# Patient Record
Sex: Female | Born: 1955 | Race: White | Hispanic: No
Health system: Midwestern US, Academic
[De-identification: ages and names within clinical notes are randomized; demographics above are authoritative.]

## PROBLEM LIST (undated history)

## (undated) LAB — DXA BONE DENSITY AXIAL SKELETON
BMD FEMORAL NECK L: 0.7
BMD PA SPINE: 0.826
BMD TOTAL HIP L: 0.834
BMD TROCHANTER L: 0.639
T-SCORE FEMORAL NECK L: -1.3
T-SCORE PA SPINE: -2.5
T-SCORE TOTAL HIP L: -0.9
Z-SCORE FEMORAL NECK L: -0.2
Z-SCORE PA SPINE: -1.3
Z-SCORE TOTAL HIP L: -0.2

## (undated) LAB — HM DEXA SCAN

## (undated) LAB — HM MAMMOGRAPHY: HM Mammogram: NORMAL

## (undated) LAB — HM PAP SMEAR

## (undated) LAB — HM COLONOSCOPY
HM Colonoscopy: NORMAL
HM Colonoscopy: NORMAL
HM Colonoscopy: NORMAL

---

## 2005-09-20 LAB — CBC AND DIFFERENTIAL
Basophils Absolute: 15 K/uL (ref 0–200)
Basophils Relative: 0.2 %
Eosinophils Absolute: 150 10*3/mm3 (ref 15–500)
Eosinophils Relative: 2 %
Hematocrit: 39.7 % (ref 35.0–45.0)
Hemoglobin: 13.6 g/dL (ref 11.7–15.5)
Lymphocytes Absolute: 2235 10*3/mm3 (ref 850–3900)
Lymphocytes Relative: 29.8 %
MCH: 31.2 pg (ref 27.0–33.0)
MCHC: 34.3 g/dL (ref 32.0–36.0)
MCV: 91.1 fL (ref 80.0–100.0)
MPV: 7.9 fL (ref 7.5–11.5)
Monocytes Absolute: 413 10*3/microliter (ref 200–950)
Monocytes Relative: 5.5 %
Neutrophils Absolute: 4688 K/uL (ref 1500–7800)
Neutrophils Relative: 62.5 %
Platelets: 311 10*3/mm3 (ref 140–400)
RBC: 4.36 10*6/mm3 (ref 3.80–5.10)
RDW: 12.2 % (ref 11.0–15.0)
WBC: 7.5 10*3/mm3 (ref 3.8–10.8)

## 2005-09-20 LAB — COMPREHENSIVE METABOLIC PANEL
A/G Ratio: 1.5 (ref 1.0–2.1)
ALT: 19 U/L (ref 6–40)
AST: 20 U/L (ref 10–35)
Albumin: 4.3 g/dL (ref 3.6–5.1)
Alkaline Phosphatase: 111 U/L (ref 33–115)
BUN/Creatinine Ratio: 26 — ABNORMAL HIGH (ref 6–22)
BUN: 18 mg/dL (ref 7–25)
CO2: 30 mmol/L (ref 21–33)
Calcium: 9.7 mg/dL (ref 8.6–10.2)
Chloride: 104 mmol/L (ref 98–110)
Creatinine: 0.7 mg/dL (ref 0.5–1.2)
GFR MDRD Non Af Amer: >60 mL/min (ref >=60)
Globulin, Total: 2.9 g/dL (ref 2.2–3.9)
Glucose: 82 mg/dL (ref 65–99)
Potassium: 4.3 mmol/L (ref 3.5–5.3)
Sodium: 142 mmol/L (ref 135–146)
Total Bilirubin: 0.5 mg/dL (ref 0.2–1.2)
Total Protein: 7.2 g/dL (ref 6.2–8.3)

## 2005-09-20 LAB — T4, FREE: Free T4: 1.1 ng/dL (ref 0.8–1.8)

## 2005-09-20 LAB — TSH: TSH: 1.19 microintl units/mL

## 2005-09-20 LAB — LITHIUM LEVEL: Lithium Lvl: 0.3 meq/L — ABNORMAL LOW (ref 0.5–1.3)

## 2005-09-20 NOTE — Unmapped (Signed)
Signed by   LinkLogic on 09/21/2005 at 07:30:20  Patient: Sarah Huang  Note: All result statuses are Final unless otherwise noted.    Tests: (1) CBC (INCLUDES DIFF/PLT) (QDL-6399)   WHITE BLOOD CELL COUNT                              7.5 Thousand/uL             3.8-10.8    RED BLOOD CELL COUNT      4.36 Million/uL             3.80-5.10    HEMOGLOBIN                13.6 g/dL                   16.6-06.3    HEMATOCRIT                39.7 %                      35.0-45.0    MCV                       91.1 fL                     80.0-100.0    MCH                       31.2 pg                     27.0-33.0    MCHC                      34.3 g/dL                   01.6-01.0    RDW                       12.2 %                      11.0-15.0    PLATELET COUNT            311 Thousand/uL             140-400    MPV                       7.9 fL                      7.5-11.5    ABSOLUTE NEUTROPHILS      4688 cells/uL               1500-7800    ABSOLUTE LYMPHOCYTES      2235 cells/uL               [phone]    ABSOLUTE MONOCYTES        413 cells/uL                200-950    ABSOLUTE EOSINOPHILS      150 cells/uL                15-500    ABSOLUTE BASOPHILS        15 cells/uL  0-200    NEUTROPHILS               62.5 %    LYMPHOCYTES               29.8 %    MONOCYTES                 5.5 %    EOSINOPHILS               2.0 %    BASOPHILS                 0.2 %    Note: An exclamation mark (!) indicates a result that was not dispersed into   the flowsheet.  Document Creation Date: 09/21/2005 7:30 AM  _______________________________________________________________________    (1) Order result status: Final  Collection or observation date-time: 09/20/2005 08:35  Requested date-time:   Receipt date-time: 09/20/2005 23:00  Reported date-time: 09/21/2005 07:00  Referring Physician:    Ordering Physician: Stan Head (GROVEG)  Specimen Source: B  Source: [name] Order Number: [id number]  Lab site: [facility]  [place]      [address]      [place] [place]  [phone]      -----------------    The following lab values were dispersed to the flowsheet  with no units conversion:      WHITE BLOOD CELL COUNT, 7.5 THOUSAND/UL, (F)  expected units: 10*3/mm3    RED BLOOD CELL COUNT, 4.36 MILLION/UL, (F)  expected units: 10*6/mm3    PLATELET COUNT, 311 THOUSAND/UL, (F)  expected units: 10*3/mm3    ABSOLUTE NEUTROPHILS, 4688 CELLS/UL, (F)  expected units: K/uL    ABSOLUTE LYMPHOCYTES, 2235 CELLS/UL, (F)  expected units: 10*3/mm3    ABSOLUTE MONOCYTES, 413 CELLS/UL, (F)  expected units: 10*3/microliter    ABSOLUTE EOSINOPHILS, 150 CELLS/UL, (F)  expected units: 10*3/mm3    ABSOLUTE BASOPHILS, 15 CELLS/UL, (F)  expected units: K/uL

## 2005-09-20 NOTE — Unmapped (Signed)
Signed by   LinkLogic on 09/21/2005 at 07:30:22  Patient: Sarah Huang  Note: All result statuses are Final unless otherwise noted.    Tests: (1) TSH, 3RD GENERATION (QDL-899)    TSH, 3RD GENERATION       1.19 mIU/L      REFERENCE RANGE:              > OR = 20 YEARS: 0.40-4.50                   PREGNANCY RANGES       FIRST TRIMESTER    0.20-4.70       SECOND TRIMESTER   0.30-4.10       THIRD TRIMESTER    0.40-2.70           Note: An exclamation mark (!) indicates a result that was not dispersed into   the flowsheet.  Document Creation Date: 09/21/2005 7:30 AM  _______________________________________________________________________    (1) Order result status: Final  Collection or observation date-time: 09/20/2005 08:35  Requested date-time:   Receipt date-time: 09/20/2005 23:00  Reported date-time: 09/21/2005 07:00  Referring Physician:    Ordering Physician: Stan Head [facility])  Specimen Source: S  Source: [name] Order Number: [id number]  Lab site: [facility] [place]      [address]      [place]  [place]  [address]

## 2005-09-20 NOTE — Unmapped (Signed)
Signed by   LinkLogic on 09/21/2005 at 07:30:19  Patient: Sarah Huang  Note: All result statuses are Final unless otherwise noted.    Tests: (1) COMPREHENSIVE METABOLIC PANEL W/EGFR (QDL-10231)    GLUCOSE                   82 mg/dL                    16-10                  FASTING REFERENCE INTERVAL    UREA NITROGEN (BUN)       18 mg/dL                    9-60    CREATININE                0.7 mg/dL                   4.5-4.0    GFR ESTIMATED             >60 mL/min/1.86m2           > OR = 60      IF THE PATIENT IS AFRICAN-AMERICAN, PLEASE MULTIPLY      THIS RESULT BY 1.21. THIS RESULT HAS BEEN CALCULATED      ASSUMING THE PATIENT IS NON-AFRICAN AMERICAN.    BUN/CREATININE RATIO (calc)                         [H]  26                          6-22    SODIUM                    142 mmol/L                  135-146    POTASSIUM                 4.3 mmol/L                  3.5-5.3    CHLORIDE                  104 mmol/L                  98-110    CARBON DIOXIDE            30 mmol/L                   21-33    CALCIUM                   9.7 mg/dL                   9.8-11.9    PROTEIN, TOTAL            7.2 g/dL                    1.4-7.8    ALBUMIN                   4.3 g/dL                    2.9-5.6    GLOBULIN (calc)  2.9 g/dL                    4.0-3.4   ALBUMIN/GLOBULIN RATIO (calc)                              1.5                         1.0-2.1    BILIRUBIN, TOTAL          0.5 mg/dL                   7.4-2.5    ALKALINE PHOSPHATASE      111 U/L                     33-115    AST                       20 U/L                      10-35    ALT                       19 U/L                      6-40    Note: An exclamation mark (!) indicates a result that was not dispersed into   the flowsheet.  Document Creation Date: 09/21/2005 7:30 AM  _______________________________________________________________________    (1) Order result status: Final  Collection or observation date-time: 09/20/2005 08:35  Requested  date-time:   Receipt date-time: 09/20/2005 23:00  Reported date-time: 09/21/2005 07:00  Referring Physician:    Ordering Physician: Stan Head (GROVEG)  Specimen Source: S  Source: [name] Order Number: [id number]  Lab site: [facility] [place]      [address]      [place]    [phone]

## 2005-09-20 NOTE — Unmapped (Signed)
Signed by   LinkLogic on 09/21/2005 at 07:30:21  Patient: Sarah Huang  Note: All result statuses are Final unless otherwise noted.    Tests: (1) T-4, FREE (QDL-866)    T-4, FREE                 1.1 ng/dL                   2.3-5.5    Note: An exclamation mark (!) indicates a result that was not dispersed into   the flowsheet.  Document Creation Date: 09/21/2005 7:30 AM  _______________________________________________________________________    (1) Order result status: Final  Collection or observation date-time: 09/20/2005 08:35  Requested date-time:   Receipt date-time: 09/20/2005 23:00  Reported date-time: 09/21/2005 07:00  Referring Physician:    Ordering Physician: Stan Head [facility])  Specimen Source: S  Source: [name] Order Number: [id number]  Lab site: [facility] [place]      [address]      [place]  [place]  [phone]

## 2005-09-20 NOTE — Unmapped (Signed)
Signed by   LinkLogic on 09/21/2005 at 07:30:23  Patient: Sarah Huang  Note: All result statuses are Final unless otherwise noted.    Tests: (1) LITHIUM (QDL-613)    LITHIUM              [L]  0.3 mEq/L                   0.5-1.3    Note: An exclamation mark (!) indicates a result that was not dispersed into   the flowsheet.  Document Creation Date: 09/21/2005 7:30 AM  _______________________________________________________________________    (1) Order result status: Final  Collection or observation date-time: 09/20/2005 08:35  Requested date-time:   Receipt date-time: 09/20/2005 23:00  Reported date-time: 09/21/2005 07:00  Referring Physician:    Ordering Physician: Stan Head [facility]  Specimen Source: S  Source: [name] Order Number: [id number]  Lab site: [facility] [place]      [address]      [place]  [place]  [address]

## 2006-07-26 NOTE — Unmapped (Signed)
Signed by Rusty Aus MD on 07/26/2006 at 00:00:00  ECHO-Transthoracic      Imported By: Coletta Memos 02/24/2009 18:35:11    _____________________________________________________________________    External Attachment:    Please see [employer] EMR for this document.

## 2007-08-22 ENCOUNTER — Inpatient Hospital Stay: Attending: Diagnostic Radiology

## 2008-05-29 ENCOUNTER — Inpatient Hospital Stay

## 2008-05-30 NOTE — Unmapped (Signed)
[facility]     PATIENT NAME:   Sarah Huang, Sarah Huang               MR #:  [id number]   DATE OF BIRTH:  Jun 11, 1955                        ACCOUNT #:  [id number]   PHYSICIAN:      Sena Hitch, M.D.            ROOM #:  SDS   SERVICE:        Gastroenterology                  NURSING UNIT:  [facility]   PRIMARY:        Launa Grill, M.D.               FCSalena Saner   REFERRING:      Sena Hitch, M.D.            ADMIT DATE:  05/29/2008   DICTATED BY:    Sena Hitch, M.D.            PROCEDURE DATE:  05/29/2008                                                     DISCHARGE DATE:                                    ENDOSCOPY REPORT       PREOPERATIVE DIAGNOSIS:     1.  Screening.   2.  Family history of cancer.     POSTOPERATIVE DIAGNOSIS:     1.  Normal.     ANESTHESIA:  Versed 9, fentanyl 100.     CONSENT:  The patient signed informed consent.  Possible complications, but   not limited to bleeding, perforation, missing a polyp or a cancer were   discussed with the patient.     RECTAL EXAM:  External:  Normal.  Digital:  No masses.     DETAILS OF THE PROCEDURE:  The Olympus video colonoscope was advanced into   the rectum, gradually into the sigmoid, to the splenic flexure, to the   transverse hepatic flexure and cecum.     The preparation was good.  Gradually we withdrew the scope.  There was no   evidence of polyps, no evidence of diverticula retroflex view and the rectum   was normal.     ASSESSMENT AND PLAN:  The patient is a 53 year old white female with maternal   aunt with colon cancer and brother and sister with colon polyps.  I will   recommend to have a follow-up colonoscopy in five years.                                                 _______________________________________   MP/dm                                  _____   D:  05/29/2008 10:20                   Sena Hitch, M.D.   T:  05/30/2008 07:50   Job #:  [id number]     c:   Launa Grill, M.D.                  ENDOSCOPY REPORT                                                                PAGE    1 of   1     MP/dm                                  _____   D:  05/29/2008 10:20                   Sena Hitch, M.D.   T:  05/30/2008 07:50   Job #:  [id number]     c:   Launa Grill, M.D.                                    ENDOSCOPY REPORT                                                                PAGE    1 of   1

## 2008-07-09 ENCOUNTER — Inpatient Hospital Stay

## 2008-07-24 NOTE — Unmapped (Signed)
Signed by Non-EMR  Physician on 07/24/2008 at 00:00:00  Vascular Physiological Waveforms      Imported By: Maryellen Pile 07/29/2008 13:15:01    _____________________________________________________________________    External Attachment:    Please see [facility] EMR for this document.

## 2008-07-24 NOTE — Unmapped (Signed)
Signed by Annalee Genta on 07/25/2008 at 13:25:56    [facility]  VASCULAR LAB      Vascular Screening Report     Ordering Physician: Self referal    It is the patient's responsibility to make an appointment with their primary care physician to discuss the screening results if the findings are outside the normal range or if there are other questions about the Vascular Screening Report.       Internal Carotid Artery Study   Right ICA:      Left ICA:   PSV: 55 cm/sec    EDV: 19 cm/sec  PSV: 57 cm/sec    EDV: 21 cm/sec    Right CCA:           Left CCA:   PSV: 71 cm/sec    EDV: 22 cm/sec  PSV: 78 cm/sec    EDV: 23 cm/sec    Normal or Mild:  Physician follow-up is usually not necessary.        Infrarenal Abdominal Aorta:   Transverse Measurements:   Proximal: 1.7 x 1.7 cm    Mid: 1.4 x 1.4 cm    Distal: 1.3 x 1.3 cm    Normal:  Physician follow-up is usually not necessary.        Ankle/Brachial Index (Lower Extremities/Pad):   Right Brachial: 144 mmHg Left Brachial: 155 mmHg  Right PTA: 186 mmHg  Left PTA: 180 mmHg  Right DPA: 155 mmHg  Left DPA: 157 mmHg  Right ABI: 1.2   Left ABI: 1.16    Normal or Mild:  Physician follow-up is usually not necessary.    Vascular Technologist: Luciana Axe RVT  Reviewed by: Joanne Gavel RVT,  Director

## 2008-09-09 ENCOUNTER — Inpatient Hospital Stay: Attending: Diagnostic Radiology

## 2009-02-18 NOTE — Unmapped (Signed)
Signed by Everitt Amber RN on 02/18/2009 at 14:20:02    PHONE NOTE    PHONE NOTE  Call placed to: Patient  Reason for Call: get patient information  Details for Reason: call to pt and VM left with request for medical records to be faxed to [phone].  Action Taken: phone call completed  Summary of call: request for medical records  Call placed by: Everitt Amber RN,  February 18, 2009 2:20 PM

## 2009-02-23 LAB — CBC AND DIFFERENTIAL
Basophils Absolute: 0 K/uL (ref 0.0–0.2)
Basophils Relative: 0.6 % (ref 0–3)
Eosinophils Absolute: 0.1 10*3/mm3 (ref 0.0–0.4)
Eosinophils Relative: 1.9 % (ref 0–7)
Hematocrit: 42.5 % (ref 34–44)
Hemoglobin: 13.7 g/dL (ref 11.5–15.0)
Lymphocytes Absolute: 2.7 K/uL (ref 0.7–4.5)
Lymphocytes Relative: 39.6 % (ref 14–46)
MCH: 29.7 pg (ref 27–34)
MCHC: 32.2 g/dL (ref 32–36)
MCV: 92 fL (ref 80–98)
Monocytes Absolute: 0.5 (ref 0.1–1.0)
Monocytes Relative: 6.8 % (ref 4–13)
Neutrophils Absolute: 3.5 K/uL (ref 1.8–7.8)
Neutrophils Relative: 51 % (ref 40–74)
Platelets: 278 10*3/mm3 (ref 140–415)
RBC: 4.62 10*6/mm3 (ref 3.8–5.1)
RDW: 12.6 % (ref 11.7–15.0)
WBC: 6.8 10*3/mm3 (ref 4.0–10.5)

## 2009-02-23 LAB — THYROID PANEL
Free Thyroxine Index: 2.3 (ref 1.2–4.9)
T3 Uptake Ratio: 30 % (ref 24–39)
T4, Total: 7.8 ug/dL (ref 4.5–12.0)
TSH: 1.01 u[IU]/mL (ref 0.450–4.500)

## 2009-02-23 LAB — COMPREHENSIVE METABOLIC PANEL
A/G Ratio: 1.8 (ref 1.1–2.5)
ALT: 17 U/L (ref 0–40)
AST: 16 U/L (ref 0–40)
Albumin: 4.6 g/dL (ref 3.5–5.5)
Alkaline Phosphatase: 109 U/L (ref 25–150)
BUN/Creatinine Ratio: 24 (ref 8–27)
BUN: 12 mg/dL (ref 5–26)
CO2: 29 mmol/L (ref 20–32)
Calcium: 9.8 mg/dL (ref 8.4–10.5)
Chloride: 105 mmol/L (ref 97–108)
Creatinine: 0.51 mg/dL — ABNORMAL LOW (ref 0.57–1.00)
GFR MDRD Af Amer: 153.48 mL/min (ref >59)
GFR MDRD Non Af Amer: 126.63 mL/min (ref >59)
Glucose: 86 mg/dL (ref 65–99)
Potassium: 4.3 mmol/L (ref 3.5–5.2)
Sodium: 142 mmol/L (ref 135–145)
Total Bilirubin: 0.3 mg/dL (ref 0.1–1.2)
Total Protein: 7.2 g/dL (ref 6.0–8.5)

## 2009-02-23 LAB — LIPID PANEL
Cholesterol, Total: 234 mg/dL — ABNORMAL HIGH (ref 100–199)
HDL: 56 mg/dL
LDL Cholesterol: 150 mg/dL — ABNORMAL HIGH (ref 0.0–99.0)
Triglycerides: 138 mg/dL (ref 0–149)

## 2009-02-23 LAB — OFFICE VISIT LAB RESULTS
Bilirubin Urine: NEGATIVE
Glucose, UA: NEGATIVE
Ketones, UA: NEGATIVE
Leukocytes, UA: NEGATIVE
Nitrite, UA: NEGATIVE
Protein, UA: NEGATIVE
Specific Gravity, UA: 1.02
Urobilinogen, UA: NORMAL
pH, UA: 6

## 2009-02-23 NOTE — Unmapped (Signed)
Signed by Rusty Aus MD on 02/25/2009 at 15:51:13  Patient: Sarah Huang  Note: All result statuses are Final unless otherwise noted.    Tests: (1) Thyroid Panel With TSH (010272)    Order Note: [address] - [address]  [place], [place] [id number]   [id number] CLIA #:[id number]    Thyroxine (T4)            7.8 ug/dL                   (8.4-16.6)    T3 Uptake                 30 %                        (24-39)    FREE THYROXINE INDEX      2.3                         (1.2-4.9)    TSH                       1.010 uIU/mL                (0.450-4.500)    Note: An exclamation mark (!) indicates a result that was not dispersed into   the flowsheet.  Document Creation Date: 02/24/2009 9:45 AM  _______________________________________________________________________    (1) Order result status: Final  Collection or observation date-time: 02/23/2009 09:34  Requested date-time: 02/23/2009 09:34  Receipt date-time: 02/23/2009 09:34  Reported date-time: 02/24/2009 06:15:00  Referring Physician:    Ordering Physician:  Doree Barthel (leesarma)  Specimen Source:   Source: Leverne Humbles Order Number: [id number])  Lab site:

## 2009-02-23 NOTE — Unmapped (Signed)
Signed by Daine Gravel MD on 02/23/2009 at 00:00:00  Disclosure      Imported By: Marlane Hatcher 02/24/2009 10:29:52    _____________________________________________________________________    External Attachment:    Please see [employer] EMR for this document.

## 2009-02-23 NOTE — Unmapped (Signed)
Signed by Daine Gravel MD on 02/23/2009 at 10:23:17    Printed Handout:  - Calcium Food Sources

## 2009-02-23 NOTE — Unmapped (Signed)
Signed by Rusty Aus MD on 02/23/2009 at 00:00:00  EKG Report      Imported By: Coletta Memos 02/24/2009 18:36:38    _____________________________________________________________________    External Attachment:    Please see [employer] EMR for this document.

## 2009-02-23 NOTE — Unmapped (Signed)
Signed by Daine Gravel MD on 02/23/2009 at 00:00:00  Privacy Notice      Imported By: Marlane Hatcher 02/24/2009 08:41:35    _____________________________________________________________________    External Attachment:    Please see [employer] EMR for this document.

## 2009-02-23 NOTE — Unmapped (Signed)
Signed by Rusty Aus MD on 02/23/2009 at 00:00:00  Cardiology ROS      Imported By: Coletta Memos 02/24/2009 18:30:47    _____________________________________________________________________    External Attachment:    Please see [employer] EMR for this document.

## 2009-02-23 NOTE — Unmapped (Signed)
Signed by Sarah Aus MD on 02/23/2009 at 10:38:54    [facility]  CARDIOLOGY NEW PATIENT VISIT    History of Present Illness       Dear Dr. Riley Huang,  I had the pleasure of seeing your patient, [name], for the following medical problems:  1. Chest pain, which appears to be atypical, but she does have strong family history of CAD in multiple family members  2. Hyperlipidemia, her last cholesterol was 250, but she has not been taking statins         PAST HISTORY  Past Medical History (reviewed - no changes required):  Hyperlipidemia  Family History (reviewed - no changes required): Mother - HTN, DIABETES, TRIPLE BYPASS  Father - EMPHYSEMA, CHF  Sister - 2- COPD, 1- BLOOD DISORDER  Brother - 2- MI, COPD, 1- TRIPLE BYPASS, 1- HEART DEFECT, OPEN HEART SURGERY    Coronary Risk Factor Assessment       Hemoglobin: 13.6 (09/20/2005 8:35:00 AM)  Hematocrit: 39.7 (09/20/2005 8:35:00 AM)  MCV: 91.1 (09/20/2005 8:35:00 AM)  MCH: 31.2 (09/20/2005 8:35:00 AM)  WBC: 7.5 THOUSAND/UL (09/20/2005 8:35:00 AM)  Platelets: 311 THOUSAND/UL (09/20/2005 8:35:00 AM)  Serum Glucose: 82 (09/20/2005 8:35:00 AM)  Albumin: 4.3 (09/20/2005 8:35:00 AM)  AST: 20 (09/20/2005 8:35:00 AM)  ALT: 19 (09/20/2005 8:35:00 AM)  Alkaline Phosphatase: 111 (09/20/2005 8:35:00 AM)  Total Bilirubin: 0.5 (09/20/2005 8:35:00 AM)  Sodium: 142 (09/20/2005 8:35:00 AM)  Potassium: 4.3 (09/20/2005 8:35:00 AM)  Chloride: 104 (09/20/2005 8:35:00 AM)  BUN: 18 (09/20/2005 8:35:00 AM)  Creatinine: 0.7 (09/20/2005 8:35:00 AM)  Serum Calcium: 9.7 (09/20/2005 8:35:00 AM)    Review of Systems  See scanned document dated February 23, 2009.  The Review of Systems has been reviewed and the patient advised to follow up with PCP for non-cardiac problems.      Intake   Intake Comments: Consult for abnormla echo      Medications   ASPIRIN 81 MG TABS (ASPIRIN) one by mouth daily  ZOCOR 80 MG TABS (SIMVASTATIN) one by mouth every evening    Allergies  Allergies  have not been documented for this patient    Vital Signs   Height: 63 in.  Weight: 176.0 lbs.   Weight change of 176 lbs since previous visit.    BMI (in-lb): 31.29  BSA (m2): 1.83  Temperature: 98.2 degrees  F (oral)  Pulse rate: 68    Pulse rhythm: regular    Respirations: 18  Pain Level: 0  Note: Pain level is assessed on a 10 point scale. 0 being pain free, 1 being barely noticeable pain and 10 being the worst pain patient has ever felt.  BP #1: 136 / 83 mm Hg  Cuff Size: Std   O2 Saturation: 97% room air   Intake recorded by: Sarah Pel MA  February 23, 2009 8:14 AM        PHYSICAL EXAMINATION  Constitutional: alert, no acute distress, well hydrated, well developed, well nourished.   Skin: normal color, no rashes, no unusual bruising, warm to touch.   Head: atraumatic, normocephalic.   Eyes: pupils equal, no injection, no icterus.   Ears/Nose/Throat: external ears normal, external nose normal, hearing normal.   Neck: no carotid bruits, no thyromegaly.   Chest: no deformities, no apparent respiratory distress, normal percussion, no chest wall tenderness, clear to auscultation, normal breath sounds.   Neck Veins: not distended.   Carotids: normal upstroke, no bruits.   Palpation: normal.  S1 S2: normal S1 S2.   Extra Sounds: no gallop or rub or click.   Rhythm: regular rate and rhythm.   Ectopy: without ectopy.   Bilateral Lower Extremity Edema: none.   Murmur: none.   Carotid - Right: normal.   Carotid - Left: normal.   Radial - Right: normal.   Radial - Left: normal.   Femoral - Right: normal.   Femoral - Left: normal.   Popliteal - Right: normal.   Popliteal - Left: normal.   Posterior Tibial - Right: normal.   Posterior Tibial - Left: normal.   Dorsalis Pedis - Right: diminished.   Dorsalis Pedis - Left: diminished.   Abdomen: nondistended, nontender, normal BS, no organomegaly, no prominent aortic pulsation.   Spine: normal mobility, no deformities.   Extremities: full joint motion, no deformities.    Neuro: cranial nerves grossly intact, non focal.   Psych: affect and mood appropriate, normal interaction.     EKG Interpretation:   Within Normal Limits  Rhythm: Normal Sinus  Conduction: Normal  Axis: Normal  ST Segment: Normal.    QT: Normal  MI: None.      Assessment and Plan     Problems Assessed Today:   HYPERLIPIDEMIA (ICD-272.4)  FAMILY HX CARDIOVASCULAR DISEASE (ICD-V17.49)  CHEST PAIN PRECORDIAL (ICD-786.51)    Assessment   She does have strong family history of CAD and has been complaining of chest pain. Her cholestol is also elevated      Plan   Stress echo to evaluate for ischemia. Zocor 40 mg by mouth daily and add aspirin 81 mg by mouth daily.     Medications   New Prescriptions/Refills:  ZOCOR 80 MG TABS (SIMVASTATIN) one by mouth every evening  #30 x 5, 02/23/2009, Sarah A Napier MA  ASPIRIN 81 MG TABS (ASPIRIN) one by mouth daily  #30 x 5, 02/23/2009, Sarah A Napier MA      Today's Orders   Echocardiogram, Treadmill Stress [CPT-93350]  CBC + plat + Diff  (6399) [*CPT-85025]  Lipid Profile   (FATS) (7600) [CPT-80061]  TSH   (TSH) (899) [ZOX-09604]  Thyroid Profile (T3,T4,TSH)  (7444) [CPT-80092]                Prescriptions:  ZOCOR 80 MG TABS (SIMVASTATIN) one by mouth every evening  #30 x 5   Entered by: Sarah A Napier MA   Authorized by: Sarah Aus MD   Signed by: Sarah A Napier MA on 02/23/2009   Method used: Print then Give to Patient   RxID: [id number]  ASPIRIN 81 MG TABS (ASPIRIN) one by mouth daily  #30 x 5   Entered by: Sarah A Napier MA   Authorized by: Sarah Aus MD   Signed by: Sarah A Napier MA on 02/23/2009   Method used: Print then Give to Patient   RxID: [id number]      Process Orders  Check Orders Results:      [facility]: ABN not required for this insurance  Tests Sent for requisitioning (February 23, 2009 10:38 AM):      02/23/2009: [facility] -- CBC + plat + Diff  (6399) [*CPT-85025] (signed)      02/23/2009: [facility] -- Lipid Profile   (FATS)  (7600) [CPT-80061] (signed)      02/23/2009: [facility] -- TSH   (TSH) (284) [XLK-44010] (signed)      02/23/2009: [facility] -- Thyroid Profile (T3,T4,TSH)  (7444) [CPT-80092] (signed)    ]

## 2009-02-23 NOTE — Unmapped (Signed)
Signed by Rusty Aus MD on 02/25/2009 at 15:51:13  Patient: Sarah Huang  Note: All result statuses are Final unless otherwise noted.    Tests: (1) Lipid Panel [phone])    Order Note: Performing Site: [facility] [address]   [place] [address].    Cholesterol, Total   [H]  234 mg/dL                   (981-191)    Triglycerides             138 mg/dL                   (4-782)    HDL Cholesterol           56 mg/dL                                                                     Female                                                High Risk           <35 - 45                                                Moderate Risk       45 - 55                                                Average Risk        55 - 63                                                Low Risk             60 - 75                                                Very Low Risk           >75                           Note: This interpretation is independent of other                                 risk factors.  ! VLDL Cholesterol Cal      28 mg/dL                    (  5.0-40.0)    LDL Cholesterol Calc [H]  213 mg/dL                   (0.8-65.7)    Note: An exclamation mark (!) indicates a result that was not dispersed into   the flowsheet.  Document Creation Date: 02/24/2009 9:37 AM  _______________________________________________________________________    (1) Order result status: Final  Collection or observation date-time: 02/23/2009 09:34  Requested date-time: 02/23/2009 09:34  Receipt date-time: 02/23/2009 09:34  Reported date-time: 02/23/2009 20:11:00  Referring Physician:    Ordering Physician:  Doree Barthel (leesarma)  Specimen Source:   Source: Leverne Humbles Order Number: [id number])  Lab site:

## 2009-02-23 NOTE — Unmapped (Signed)
Signed by Daine Gravel MD on 02/23/2009 at 10:18:26    Printed Handout:  - High Blood Pressure: Eating Foods Low in Salt: Brief Version

## 2009-02-23 NOTE — Unmapped (Signed)
Signed by Daine Gravel MD on 02/23/2009 at 93:23:55      Reason for Visit   Chief Complaint: NEW PT    History from: patient    Allergies  Allergies have not been documented for this patient    Medications   New Directive:  DISCLOSURE TO FAMILY AND FRIENDS    Vital Signs:   Wt: 176 lbs.      Pulse: 72  BP: 148/85  Barriers to Care/Communication: Hearing  Urine Dip: Done     Intake recorded by: Philipp Ovens MA on February 23, 2009 9:26 AM      History of Present Illness: new pt  some breathing troubles, seeing cardiol. for this, to have stress test Fri  strong fam hx of cardiac problems  hx of sleep apnea and needs referral for sleep eval  very fatigued and very hard to get up in am  dx'ed w/OSA  in '08 when snoring a lot and had sleep study and dx'ed OSA  tried cpap but some trouble adjusting to it  some wt gain past few yrs  overdue for CPE   FASTING  bad hot flashes and vag dryness         PAST HISTORY  Past Medical History (reviewed - no changes required):  kidney stones  Surgical History (reviewed - no changes required):  s/p hysty/BSO--ectopic pregnancy/prolapse, hx sinus surg x4    Family History (reviewed - no changes required): mother--DM, HTN, CAD, COPD, high chol  aunt--breast ca  father--CAD, COPD, high chol  siblings--CAD, COPD  Social History (reviewed - no changes required): Marital Status: divorced,   Children: 2,   Alcohol Use: occasionally  Tobacco Usage:prior smoker    Review of Systems  Refer to HPI for review of systems documentation.      Physical Examination:   BP: 148/  85    Additional Vitals:   BP: 146 / 90    Physical Exam- Detail:   General Appearance: well-developed, well-nourished and in no acute distress. mildly obese  Skin: No suspicious rashes or lesions.  Eyes: Sclera white, conjunctiva without injection and pallor.  PERRLA.  EOMI  Ears: No lesions.  Tympanic membranes translucent, non-bulging.  Canal walls pink, without discharge.  Hearing grossly  intact.  Oropharynx: Normal appearance.  No erythema, exudate or mass. No tonsillar swelling.  Oral Cavity: Gums pink, good dentition.  Oral mucosa and tongue without lesions.  Respiratory: Respiration un-labored.  Lung fields clear to auscultation.  No wheezing, rales, rhonchi or pleural rub.  Neck: No thyromegaly.  No nodules, masses or tenderness.  Lymphatic: Areas palpated not enlarged:  cervical, supraclavicular.  Breast: Breasts symmetrical.  No lumps, masses, discharge, tenderness or dimpling.  Cardiac: S1 and S2 normal.  RRR without murmurs, rubs, gallops.  No JVD.  Vascular: No carotid bruits.  No edema or varicosities.  Pedal Pulse Left: dorsalis pedis +2, posterior tib +2  Pedal Pulse Right: dorsalis pedis +2,  posterior tib +2  Abdomen: No masses or tenderness. Bowel sounds active x4 quad.  Liver and spleen are without tenderness or enlargement.  No hernias.  Female G/U: s/p hysty  Neurologic: sens/motor/dtr/cereb intact  Psychiatric: normal affect      In office Procedures & Tests     Routine Urinalysis     Physical characteristics   Color: yellow  Appearance: clear    Chemical measurements   Glucose (mg/dL): negative  Bilirubin: negative  Ketone (mg/dL): negative  Spec. Gravity: 1.020  Blood: non-hemolyzed  moderate  pH: 6  Protein (mg/dL): negative  Urobilinogen (mg/dL): normal  Nitrite: negative  Leukocytes: negative         New Problems:  ELEVATED BP READING WITHOUT DX HYPERTENSION (ICD-796.2)  GERD (ICD-530.81)  SLEEP APNEA (ICD-780.57)  SLEEP APNEA (ICD-780.57)  ROUTINE GENERAL MEDICAL EXAM@HEALTH  CARE FACL (ICD-V70.0)  FATIGUE (ICD-780.79)      Preventive Maintenance     Colonoscopy Test Date: 01/11/2008 Colonoscopy: Normal   Mammogram Date: 01/11/2008 Mammogram: Normal             Assessment and Plan     Problems   Status of Existing Problems:  Assessed ROUTINE GENERAL MEDICAL EXAM@HEALTH  CARE FACL as comment only - counselled re HM - Daine Gravel MD  Assessed ELEVATED BP READING  WITHOUT DX HYPERTENSION as comment only - counseleld re lifestyle measures to improve this - Daine Gravel MD  New Problems:  Dx of ELEVATED BP READING WITHOUT DX HYPERTENSION (ICD-796.2)  Onset: 02/23/2009  Dx of GERD (ICD-530.81)  Onset: 02/23/2009  Dx of SLEEP APNEA (ICD-780.57)  Onset: 02/23/2009  Dx of SLEEP APNEA (ICD-780.57)  Onset: 02/23/2009  Dx of ROUTINE GENERAL MEDICAL EXAM@HEALTH  CARE FACL (ICD-V70.0)  Onset: 02/23/2009  Dx of FATIGUE (ICD-780.79)  Onset: 02/23/2009  Today's Orders   66440 - Preventive, New, 40-64 yr [CPT-99386]  UA Dipstick (Office) [CPT-81002]  CMP   (METAPNL) (10231) [CPT-80053]  Sleep Medicine Consult [UCP-11111]    Patient Instructions   check bp weekly and keep a record of readings to bring w/you    Disposition:   in 3 month(s)                   Process Orders  Check Orders Results:      Error: EMR Link Lab: An error occurred trying to run '[email or web address]' : The system cannot find the file specified.  Tests Sent for requisitioning (February 23, 2009 4:27 PM):      02/23/2009: EMR Link Lab -- CMP   (METAPNL) (10231) [CPT-80053] (signed)    ]

## 2009-02-23 NOTE — Unmapped (Signed)
Signed by Rusty Aus MD on 02/25/2009 at 15:51:13  Patient: Sarah Huang  Note: All result statuses are Final unless otherwise noted.    Tests: (1) Comp. Metabolic Panel (14) (322000)    Order Note: Performing Site: [facility] [address]   [place] [address].    Calcium, Serum            9.8 mg/dL                   (8.1-19.1)      Please note: The calcium reference range has been updated as of       02/21/2009.    Glucose, Serum            86 mg/dL                    (47-82)    BUN                       12 mg/dL                    (9-56)   Protein, Total, Serum                              7.2 g/dL                    (2.1-3.0)    Albumin, Serum            4.6 g/dL                    (8.6-5.7)    Bilirubin, Total          0.3 mg/dL                   (8.4-6.9)   Alkaline Phosphatase, S                              109 U/L                     (25-150)    AST (SGOT)                16 U/L                      (0-40)    Potassium, Serum          4.3 mmol/L                  (3.5-5.2)    Sodium, Serum             142 mmol/L                  (135-145)    Chloride, Serum           105 mmol/L                  (97-108)    Creatinine, Serum    [L]  0.51 mg/dl                  (6.29-5.28)    ALT (SGPT)                17 U/L                      (  0-40)   Carbon Dioxide, Total                              29 mmol/L                   (20-32)    BUN/Creatinine Ratio      24                          (8-27)  ! Globulin, Total           2.6 g/dL                    (4.4-0.1)    A/G Ratio                 1.8                         (1.1-2.5)    Glom Filt Rate, Est       126.63 mL/min               (>59-)    If African American       153.48 mL/min               (>59-)    Note: An exclamation mark (!) indicates a result that was not dispersed into   the flowsheet.  Document Creation Date: 02/24/2009 9:37 AM  _______________________________________________________________________    (1) Order result status:  Final  Collection or observation date-time: 02/23/2009 09:34  Requested date-time: 02/23/2009 09:34  Receipt date-time: 02/23/2009 09:34  Reported date-time: 02/23/2009 20:11:00  Referring Physician:    Ordering Physician:  Doree Barthel (leesarma)  Specimen Source:   Source: Leverne Humbles Order Number: [id number])  Lab site:

## 2009-02-23 NOTE — Unmapped (Signed)
Signed by Rusty Aus MD on 02/25/2009 at 15:51:13  Patient: Sarah Huang  Note: All result statuses are Final unless otherwise noted.    Tests: (1) CBCwDiffwPlt (540981)    Order Note: Performing Site: [facility] [address]   [place] [address].    WBC                       6.8 x10E3/uL                (4.0-10.5)    RBC                       4.62 x10E6/uL               (3.8-5.1)    Hemoglobin                13.7 g/dL                   (82.9-56.2)    Hematocrit                42.5 %                      (34-44)    MCV                       92 fL                       (80-98)    MCH                       29.7 pg                     (27-34)    MCHC                      32.2 g/dL                   (13-08)    Neutrophils               51.0 %                      (40-74)    Lymphocytes               39.6 %                      (14-46)    Monocytes                 6.8 %                       (4-13)    Eosinophils               1.9 %                       (0-7)    Basophils                 0.6 %                       (0-3)    Platelets  278 x10E3/uL                (140-415)   Neutrophils (Absolute)                              3.5 x10E3/uL                (1.8-7.8)   Lymphocytes (Absolute)                              2.7 x10E3/uL                (0.7-4.5)    Monocytes(Absolute)       0.5 x10E3/uL                (0.1-1.0)   Eosinophils (Absolute)                              0.1 x10E3/uL                (0.0-0.4)    Basophils (Absolute)      0.0 x10E3/uL                (0.0-0.2)    RDW                       12.6 %                      (11.7-15.0)    Note: An exclamation mark (!) indicates a result that was not dispersed into   the flowsheet.  Document Creation Date: 02/24/2009 9:35 AM  _______________________________________________________________________    (1) Order result status: Final  Collection or observation date-time: 02/23/2009 09:34  Requested date-time: 02/23/2009  09:34  Receipt date-time: 02/23/2009 09:34  Reported date-time: 02/23/2009 19:29:00  Referring Physician:    Ordering Physician:  Doree Barthel (leesarma)  Specimen Source:   Source: Leverne Humbles Order Number: [id number])  Lab site:       -----------------    The following lab values were dispersed to the flowsheet  with no units conversion:      WBC, 6.8 X10E3/UL, (F)  expected units: 10*3/mm3    RBC, 4.62 X10E6/UL, (F)  expected units: 10*6/mm3    Platelets, 278 X10E3/UL, (F)  expected units: 10*3/mm3    Neutrophils (Absolute), 3.5 X10E3/UL, (F)  expected units: K/uL    Lymphocytes (Absolute), 2.7 X10E3/UL, (F)  expected units: K/uL    Eosinophils (Absolute), 0.1 X10E3/UL, (F)  expected units: 10*3/mm3    Basophils (Absolute), 0.0 X10E3/UL, (F)  expected units: K/uL

## 2009-02-24 NOTE — Unmapped (Signed)
Signed by Eli Hose RN on 02/24/2009 at 15:13:01    PHONE NOTE    PHONE NOTE  Call placed to: Patient  Reason for Call: confirm/change appointment  Action Taken: phone call completed  Summary of call: LMA instructing pt to return the call regarding test on Friday. Would like to discuss date, time, and instructions.  Call placed by: Eli Hose RN,  February 24, 2009 3:11 PM

## 2009-02-27 NOTE — Unmapped (Signed)
Signed by Nelva Bush RDCS on 03/02/2009 at 08:46:45    Clinical Lists Changes    Orders:  Added new Service order of Stress Echo ([id number]) - Signed  Added new Service order of Color Flow Doppler ([id number]) - Signed  Added new Service order of Cardiac Doppler ([id number]) - Signed

## 2009-02-27 NOTE — Unmapped (Signed)
Signed by April A Napier MA on 02/27/2009 at 13:33:10    Clinical Lists Changes    Orders:  Added new Test order of Liver function panel  (LIVP) (10256) (CPT-80076) - Signed  Added new Test order of Lipid Profile   (FATS) (7600) ([id number]) - Signed      Process Orders  Check Orders Results:      EMR Link Lab: ABN not required for this insurance  Tests Sent for requisitioning (February 27, 2009 1:33 PM):      02/27/2009: EMR Link Lab -- Liver function panel  (LIVP) (10256) [CPT-80076] (signed)      02/27/2009: EMR Link Lab -- Lipid Profile   (FATS) (7600) [CPT-80061] (signed)

## 2009-02-27 NOTE — Unmapped (Signed)
Signed by Rusty Aus MD on 02/27/2009 at 00:00:00  ECHO-Transthoracic      Imported By: Coletta Memos 03/10/2009 10:34:58    _____________________________________________________________________    External Attachment:    Please see [employer] EMR for this document.

## 2009-02-27 NOTE — Unmapped (Signed)
Signed by Rusty Aus MD on 02/27/2009 at 00:00:00  EKG Report      Imported By: Betsey Amen 03/17/2009 16:58:01    _____________________________________________________________________    External Attachment:    Please see [employer] EMR for this document.

## 2009-02-27 NOTE — Unmapped (Signed)
Signed by Lionel December MD on 02/27/2009 at 00:00:00  Stress Echo      Imported By: Betsey Amen 03/04/2009 21:10:41    _____________________________________________________________________    External Attachment:    Please see [employer] EMR for this document.

## 2009-03-05 NOTE — Unmapped (Signed)
Signed by Rusty Aus MD on 03/05/2009 at 00:00:00  Outside Medical Records      Imported By: Betsey Amen 03/05/2009 15:22:21    _____________________________________________________________________    External Attachment:    Please see [employer] EMR for this document.

## 2009-04-15 NOTE — Unmapped (Signed)
Signed by Maxwell Marion MD on 04/15/2009 at 00:00:00  Sleep Medicine History Questionnaire      Imported By: Coletta Memos 04/25/2009 13:33:07    _____________________________________________________________________    External Attachment:    Please see [employer] EMR for this document.
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
## 2009-04-16 NOTE — Unmapped (Signed)
Signed by Maxwell Marion MD on 04/16/2009 at 10:40:09                        [facility]         Pulmonary, Critical Care, and Sleep Medicine         [address]         [place], [place] [address]         p [phone] f [phone]         [email or web address]      April 16, 2009        RE: MELISA DONOFRIO  DOB:   02/01/1955        Dear Drs. Parthasarathi and Darrel Reach,    I had the pleasure of seeing Maeli Spragg at your request for consultation. My detailed assessment and plan is described below. Should you have any further questions please do not hesitate to contact me at [phone].    IMPRESSION & PLAN:    Ms. Baria is a 54 year old woman with a history of depression who presents with snoring, gasp arousals, witnessed apneas, morning headaches, and excessive daytime sleepiness.  On examination, she has a crowded airway, with macroglossia, micrognathia and retrognathia.  She had been diagnosed with obstructive sleep apnea three years ago, but had not been on positive airway pressure treatment for the past two years due to financial reasons.  She was also intolerant to the pressure.      Since her initial diagnosis, she has gained 30-40 pounds.  As a result, the degree of sleep-disordered breathing may have changed.  In order to fully evaluate, we will order a polysomnogram.  If the polysomnogram results are consistent with obstructive sleep apnea, she will be scheduled for a titration study prior to initiating treatment.   She mentions that she was on bilevel positive airway pressure treatment, though also states that she was on 9cm of water, suggesting that she was on continuous positive airway pressure treatment.  Regardless, will start with continuous positive airway pressure and switch to bilevel positive airway pressure treatment if necessary for patient comfort.  If still intolerant to positive airway pressure therapy, other options such as an oral  appliance may be considered, especially since she does have micrognathia on examination.      Since daytime sleepiness also may be due to an inadequate amount of sleep at night, she is encouraged to increase her sleep time to 6-8 hours per night.  She was strongly encouraged not to drive while drowsy.       Sincerely,        Maxwell Marion MD

## 2009-04-27 ENCOUNTER — Inpatient Hospital Stay

## 2009-04-27 NOTE — Unmapped (Signed)
Signed by Doren Custard on 05/04/2009 at 14:29:55    [facility]  Sleep Medicine Center  [address], [address] [place], [place] [address]  Tel: [phone]\\\\ Fax: [phone]  NOCTURNAL POLYSOMNOGRAPHY REPORT    Patient Name :   Sarah Huang, Sarah Huang Project :   Sarah Huang, Sarah Huang [facility] # :  [id number] Subject Code :  456/11   Study Date :   04/27/2009 Referring Physician :   Riley Churches   Sex :   D.O.B. :    Female  03/31/1955 Sleep Specialist :   Marry Guan     This is a nocturnal polysomnogram performed on the evening of 04/27/2009, on this 54 year-old Female with a height of 64.0 inches and a weight of 185.0 pounds, for a BMI of 31.8 kg/m2 which is within obesity range. Sarah Huang is a 54 year old woman with a history of depression who has snoring, gasp arousals, witnessed apneas, morning headaches, and excessive daytime sleepiness.  She has a known history of obstructive sleep apnea (no results available for review) but was intolerant to CPAP in the past.  Since the patient had gained 30-40 pounds since her previous study, a repeat diagnostic polysomnogram was performed to determine the degree of sleep-disordered breathing.      This study was performed on a [employer]man?? Elite sleep system, monitoring right and left oculograms, bilateral frontal, central, and occipital EEG leads, nasal oral airflow via nasal pressure and thermocouple, thoracic and abdominal respiratory effort channels via piezoelectric method, EKG, pulse rate, arterial oxyhemoglobin saturation level via pulse oximetry, left and right anterior tibialis EMG, chin EMG, body position, video channel, and snoring microphone.    The raw data was visually analyzed and scored according to AASM accepted criteria. Apnea is defined as cessation of airflow for at least 10 seconds; hypopnea is defined as at least a 50% reduction in the nasal pressure transducer signal lasting at least 10 seconds, accompanied by at least a 3% decrease in  oxyhemoglobin saturation or an arousal.  The raw polysomnographic information was reviewed in its entirety by the interpreting physician.     EEG Analysis:  There were no apparent ictal or interictal abnormalities on the six-lead EEG.      Sleep Parameters:  Review of the electroencephalographic tracings revealed that this patient was studied for 390.9 minutes, of which 357.5 minutes were spent during sleep, for sleep efficiency of 91.4%. The sleep onset latency was 8.5 minutes.    The patient reached 3 REM sleep cycles during this study for REM sleep latency from sleep onset of 90.5 minutes.    Of note, this study was technically adequate, as it did indeed record REM sleep in supine position.    Of the total sleep time, the patient spent 15.0% in sleep stage N1, 41.1% in sleep stage N2, 24.5% in stage N3, and 19.4% in sleep stage REM.    No clinically significant periodic limb movements were recorded during this study.    There were 74 spontaneous arousals recorded during this study, for a spontaneous arousal index of 12.4 events per hours.    There were 10 respiratory-effort related arousals recorded during this study for a RERAI of 1.7 events per hour.    Cardiorespiratory Parameters:  Review of the respiratory tracings revealed that the patient had 1 central apneas, 1 obstructive apneas, 0 mixed apneas, and 50 obstructive hypopneas recorded during this study, for an apnea-hypopnea (AHI) index of 8.7 events per hour, which  is within mild range. The central event index was 0.2 events per hour.The supine event index was 9.6 events per hour and the non-supine event index was 7.4 events per hour. The REM sleep event index was 28.5 events per hour, and the NREM sleep event index was 4.0 events per hour.    Review of the oxyhemoglobin saturation tracings revealed that this patient reached a minimum level of 87.0 in REM sleep and 88.0 in NREM sleep. Of the total NREM sleep time, this patient spent 99.9% with levels  above 90%, and 0.1% with levels between 80 and 90%. Of the total REM sleep time, this patient spent 95.0% with levels above 90%, and 5.0% with levels between 80 and 90%.    The single lead EKG showed sinus rhythm, with heart rates between 50 and 100 beats per minute.    Impressions & Recommendations:      This study shows obstructive sleep apnea, with an apnea-hypopnea index of 8.7 events per hour and an oxygen saturation nadir of 87%.  Respiratory events were most prominent during REM sleep.  Concurrent REM and supine sleep were recorded.  A titration study with continuous positive airway pressure treatment is recommended.                      Caffie Damme, MD  Assistant Professor of Neurology  [facility]

## 2009-04-27 NOTE — Unmapped (Signed)
Signed by Maxwell Marion MD on 04/27/2009 at 00:00:00  PSG/CPAP Report      Imported By: Coletta Memos 05/01/2009 19:51:14    _____________________________________________________________________    External Attachment:    Please see [employer] EMR for this document.

## 2009-05-04 NOTE — Unmapped (Signed)
Signed by Doren Custard on 05/04/2009 at 14:30:47    NPSG results routed to Dr. Deno Etienne.  ..................................................................Marland KitchenDoren Custard  May 04, 2009 2:30 PM

## 2009-05-04 NOTE — Unmapped (Addendum)
Signed by Doren Custard on 05/04/2009 at 14:45:09    Called talked to pt read results and scheduled cpap. Pt will call back about scheduling cpap clinic appt. Pt has requested [facility] in [place] due to living in [place], [place].  ..................................................................Marland KitchenDoren Custard  May 04, 2009 2:45 PM    Signed by Doren Custard on 05/04/2009 at 15:29:26    Faxed npsg report and request to [facility] for them to contact pt with cpap equipment benefits. They are indeed in network.

## 2009-05-07 ENCOUNTER — Inpatient Hospital Stay

## 2009-05-07 NOTE — Unmapped (Signed)
Signed by Maxwell Marion MD on 05/07/2009 at 00:00:00  PSG/CPAP Report      Imported By: Coletta Memos 05/08/2009 20:02:49    _____________________________________________________________________    External Attachment:    Please see [employer] EMR for this document.

## 2009-05-07 NOTE — Unmapped (Signed)
Signed by Domingo Sep on 05/11/2009 at 10:37:47    [facility]  Sleep Medicine Center  [address] [place], [place] [address]  Tel: [phone]\\\\ Fax: [phone]    NOCTURNAL POLYSOMNOGRAPHY REPORT WITH CPAP TITRATION    Patient Name :   Sarah Huang, Sarah Huang, Sarah Huang CPAP   Hospital # :  [id number] Subject Code :  514/11   Study Date :   05/07/2009 Referring Physician :   Riley Churches   Sex :   D.O.B. :    Female  07-12-1955 Sleep Specialist :   Marry Guan     This CPAP titration study was performed on this 54 year-old woman with a height of 64.0 inches and a weight of 185.0 pounds, for a BMI of 31.8 kg/m2 which is within obesity range.  A diagnostic polysomnogram showed REM-dependent obstructive sleep apnea, with an apnea-hypopnea index of 8.7 events per hour and an oxygen saturation nadir of 87%.   Associated medical conditions include depression.    This study was performed on a Sandman?? Elite sleep system, monitoring right and left oculograms, F4-M1, F3-M2, C4-M1, C3-M2, O2-M1, O1-M2, positive airway pressure level and flow via pressure transducer, thoracic and abdominal respiratory effort channels via respiratory inductance plethysmography, EKG, pulse rate, arterial oxyhemoglobin saturation level via pulse oximetry, left and right anterior tibialis EMG, chin EMG, body position, video channel, and snoring microphone.    The raw data was visually analyzed and scored according to AASM accepted criteria. Apnea is defined as cessation of airflow for at least 10 seconds; hypopnea is defined as at least a 50% reduction in airflow lasting at least 10 seconds, accompanied by at least a 3% decrease in oxyhemoglobin saturation or an arousal. The raw polysomnographic data was reviewed in its entirety by the interpreting physician.    EEG Analysis:  There were no apparent ictal or interictal abnormalities on the six-lead EEG.      Sleep Parameters:  Review of the  electroencephalographic tracings revealed that this patient was studied for 456.9 minutes, of which 427.4 minutes were spent during sleep, for a sleep efficiency of 93.5%. The sleep onset latency was 4.0 minutes.    The patient reached 4 REM (R) sleep cycles during this study for R sleep latency from sleep onset of 58.0 minutes.    Concurrent REM and supine sleep were seen at a CPAP setting of 5 cm of water.      Of the total sleep time, the patient spent 6.4% in sleep stage N1, 51.4% in sleep stage N2, 26.2% in stage N3, and 16.0% in sleep stage R.    There were no clinically significant periodic limb movements recorded during this study.    There were 45 spontaneous arousals recorded during this study, for a spontaneous arousal index of 6.3 events per hours.    There were 0 respiratory-effort related arousals recorded during this study for a RERAI of 0.0 events per hour.    Cardiorespiratory Parameters:  Review of the respiratory tracings revealed that this patient had 6 central apneas, 0 obstructive apneas, 0 mixed apneas, and 34 obstructive hypopneas recorded during this study, for an apnea-hypopnea index (AHI) 5.6 of events per hour. The R sleep event index was 13.2 events per hour, and the NREM sleep event index was 4.2 events per hour.    Review of the oxyhemoglobin saturation tracings revealed that this patient reached a minimum level of 90.0% in R sleep and 85.0% in  N sleep. Of the total N sleep time, this patient spent 100.0% with levels above 90%, 0.0% with levels between 80 and 90%, 0.0% with levels between 70 and 80%, 0.0% with levels between 60 and 70%, 0.0% with levels between 50 and 60%. Of the total R sleep time, this patient spent 99.9% with levels above 90%, 0.0% with levels between 80 and 90%, 0.0% with levels between 70 and 80%, 0.0% with levels between 60 and 70%, 0.0% with levels between 50 and 60%.    This patient was studied with the addition of nocturnal CPAP. The pressure was delivered via  a Mirage Quattro FX full face interface, size medium. Heated humidification was used to improve the patient???s tolerance of the nasal mask. The pressure was calibrated from an initial level of 5 cm H2O to a final level of 8 cm H2O. At a CPAP setting of 8 cm H2O, this patient was studied for 1 hour 18 minutes of which 10 minutes of R sleep and 1 hour 5 minutes of N sleep were recorded. The apnea-hypopnea index (AHI) at this level of pressure was 2.4 events per hour. The minimum oxyhemoglobin saturation at this level of pressure was 85%.    The single-lead EKG appeared to show sinus rhythm, with heart rates between 51 and 84 bpm.     Impressions & Recommendations:    This study shows improvement of obstructive sleep apnea at a CPAP setting of 8 cm H2O.  REM but not supine sleep was seen at this setting.  A Mirage Quattro FX full face interface, size medium was used during the study. Compliance with this regimen and cooperation with the home equipment company is strongly recommended.  Close clinical follow-up in an effort to address possible nasal or facial side effects is encouraged. If the patient???s symptoms recur or do not completely resolve, a repeat titration study may be indicated.       Signed by Domingo Sep on 05/11/2009 at 10:37:47      Caffie Damme, M.D.    Assistant Professor  Department of Neurology  [employer]

## 2009-05-08 NOTE — Unmapped (Addendum)
Signed by Maxwell Marion MD on 05/08/2009 at 12:31:48    Clinical Lists Changes    Problems:  Added new problem of SLEEP APNEA, OBSTRUCTIVE (ICD-327.23)  Orders:  Added new Referral order of Treatment Orders ([id number]) - Signed      Signed by Weldon Inches RPSGT on 05/11/2009 at 14:54:12    Faxed Tx order ( 8cmH2O ), Demos, NPSG/CPAP studies to [facility] per pt request.

## 2009-05-11 NOTE — Unmapped (Addendum)
Signed by Domingo Sep on 05/11/2009 at 10:51:16    routed cpap results to Dr. Riley Churches. l/m to give results, we will send equpment script to dme who will call her in the next few days, pt needs cpap clinic scheduled, follow up ov is 7/13 with Dr. Marry Guan.    ..................................................................Marland KitchenJanan Halter Schlensker  May 11, 2009 10:45 AM    Signed by Lenn Sink on 05/13/2009 at 08:42:28    Patient returned call. Reviewed results. Appointment for CPAP clinic made. Instructed patient to bring equipment to appointment. She requests that her script for equipment be sent to [place] in [place].  ..................................................................Marland KitchenLenn Sink  May 13, 2009 8:41 AM    Signed by Weldon Inches RPSGT on 05/13/2009 at 14:46:15    Spoke to [place] at [facility], They have rec'd pressure change order and supply order.

## 2009-05-13 LAB — AST: AST: 19 U/L (ref 0–40)

## 2009-05-13 LAB — LIPID PANEL
Cholesterol, Total: 146 mg/dL (ref 100–199)
HDL: 58 mg/dL
LDL Cholesterol: 66 mg/dL (ref 0.0–99.0)
Triglycerides: 112 mg/dL (ref 0–149)

## 2009-05-13 LAB — ALT: ALT: 20 U/L (ref 0–40)

## 2009-05-13 LAB — CK, CARDIAC ENZYME: Total CK: 61 U/L (ref 24–173)

## 2009-05-13 NOTE — Unmapped (Signed)
Signed by Rusty Aus MD on 05/25/2009 at 14:06:56  Patient: Sarah Huang  Note: All result statuses are Final unless otherwise noted.    Tests: (1) AST (SGOT) (161096)    Order Note: Performing Site: [facility] [address]   [place] [address].    AST (SGOT)                19 U/L                      (0-40)    Note: An exclamation mark (!) indicates a result that was not dispersed into   the flowsheet.  Document Creation Date: 05/13/2009 7:30 PM  _______________________________________________________________________    (1) Order result status: Final  Collection or observation date-time: 05/13/2009 09:44  Requested date-time: 05/13/2009 09:44  Receipt date-time: 05/13/2009 09:44  Reported date-time: 05/13/2009 19:18:00  Referring Physician:    Ordering Physician:  Doree Barthel (leesarma)  Specimen Source:   Source: Leverne Humbles Order Number: [id number])  Lab site:

## 2009-05-13 NOTE — Unmapped (Signed)
Signed by Rusty Aus MD on 05/25/2009 at 14:06:56  Patient: Makayah Rawl  Note: All result statuses are Final unless otherwise noted.    Tests: (1) ALT (SGPT) (161096)    Order Note: Performing Site: [facility] [address]   [place] [address].    ALT (SGPT)                20 U/L                      (0-40)    Note: An exclamation mark (!) indicates a result that was not dispersed into   the flowsheet.  Document Creation Date: 05/13/2009 7:30 PM  _______________________________________________________________________    (1) Order result status: Final  Collection or observation date-time: 05/13/2009 09:44  Requested date-time: 05/13/2009 09:44  Receipt date-time: 05/13/2009 09:44  Reported date-time: 05/13/2009 19:18:00  Referring Physician:    Ordering Physician:  Doree Barthel (leesarma)  Specimen Source:   Source: Leverne Humbles Order Number: [id number])  Lab site:

## 2009-05-13 NOTE — Unmapped (Signed)
Signed by Everitt Amber RN on 05/13/2009 at 09:39:56    Clinical Lists Changes    Orders:  Added new Test order of Lipid Profile   (FATS) (7600) (CPT-80061) - Signed  Added new Test order of ALT    (SGPT) (823)  (*[id number]) - Signed  Added new Test order of AST (SGOT) (822) ([id number]) - Signed  Added new Test order of Creatine Kinase  (CK) (374) ([id number]) - Signed      Process Orders  Check Orders Results:      EMR Link Lab: ABN not required for this insurance  Tests Sent for requisitioning (May 13, 2009 9:39 AM):      05/13/2009: EMR Link Lab -- Lipid Profile   (FATS) (7600) [CPT-80061] (signed)      05/13/2009: EMR Link Lab -- ALT    (SGPT) (823)  [*[id number]] (signed)      05/13/2009: EMR Link Lab -- AST (SGOT) (822) [CPT-84450] (signed)      05/13/2009: EMR Link Lab -- Creatine Kinase  (CK) (374) [CPT-82550] (signed)

## 2009-05-13 NOTE — Unmapped (Signed)
Signed by Rusty Aus MD on 06/09/2009 at 08:59:53  Patient: Sarah Huang  Note: All result statuses are Final unless otherwise noted.    Tests: (1) Creatine Kinase,Total,Serum [phone])    Order Note: Performing Site: [facility] [address]   [place] [address].   Creatine Kinase,Total,Serum                              61 U/L                      (24-173)    Note: An exclamation mark (!) indicates a result that was not dispersed into   the flowsheet.  Document Creation Date: 05/13/2009 7:30 PM  _______________________________________________________________________    (1) Order result status: Final  Collection or observation date-time: 05/13/2009 09:44  Requested date-time: 05/13/2009 09:44  Receipt date-time: 05/13/2009 09:44  Reported date-time: 05/13/2009 19:18:00  Referring Physician:    Ordering Physician:  Doree Barthel (leesarma)  Specimen Source:   Source: Leverne Humbles Order Number: [id number])  Lab site:

## 2009-05-13 NOTE — Unmapped (Signed)
Signed by Rusty Aus MD on 05/25/2009 at 14:06:56  Patient: Jeannene Weil  Note: All result statuses are Final unless otherwise noted.    Tests: (1) Lipid Panel [phone])    Order Note: Performing Site: [facility] [address]   [place] [address].    Cholesterol, Total        146 mg/dL                   (272-536)    Triglycerides             112 mg/dL                   (6-440)    HDL Cholesterol           58 mg/dL                                                                     Female                                                High Risk           <35 - 45                                                Moderate Risk       45 - 55                                                Average Risk        55 - 63                                                Low Risk             60 - 75                                                Very Low Risk           >75                           Note: This interpretation is independent of other                                 risk factors.  ! VLDL Cholesterol Cal      22 mg/dL                    (  5.0-40.0)    LDL Cholesterol Calc      66 mg/dL                    (2.9-52.8)    Note: An exclamation mark (!) indicates a result that was not dispersed into   the flowsheet.  Document Creation Date: 05/13/2009 7:30 PM  _______________________________________________________________________    (1) Order result status: Final  Collection or observation date-time: 05/13/2009 09:44  Requested date-time: 05/13/2009 09:44  Receipt date-time: 05/13/2009 09:44  Reported date-time: 05/13/2009 19:18:00  Referring Physician:    Ordering Physician:  Doree Barthel (leesarma)  Specimen Source:   Source: Leverne Humbles Order Number: [id number])  Lab site:

## 2009-05-20 LAB — OFFICE VISIT LAB RESULTS
Bilirubin Urine: NEGATIVE
Glucose, UA: NEGATIVE
Ketones, UA: NEGATIVE
Nitrite, UA: NEGATIVE
Specific Gravity, UA: 1.01
Urobilinogen, UA: NORMAL
pH, UA: 6.5

## 2009-05-20 LAB — RENAL FUNCTION PANEL W/EGFR
Albumin: 4.5 g/dL (ref 3.5–5.5)
BUN/Creatinine Ratio: 23 (ref 8–27)
BUN: 11 mg/dL (ref 5–26)
CO2: 30 mmol/L (ref 20–32)
Calcium: 9.4 mg/dL (ref 8.4–10.5)
Chloride: 102 mmol/L (ref 97–108)
Creatinine: 0.48 mg/dL (ref 0.57–1.00)
GFR MDRD Af Amer: 163.95 mL/min (ref >59)
GFR MDRD Non Af Amer: 135.27 mL/min (ref >59)
Glucose: 86 mg/dL (ref 65–99)
Phosphorus: 3.7 mg/dL (ref 2.5–4.5)
Potassium: 4.5 mmol/L (ref 3.5–5.2)
Sodium: 141 mmol/L (ref 135–145)

## 2009-05-20 LAB — MAGNESIUM: Magnesium: 2.2 mg/dL (ref 1.6–2.6)

## 2009-05-20 NOTE — Unmapped (Signed)
Signed by Daine Gravel MD on 05/22/2009 at 15:04:10  Patient: Sarah Huang  Note: All result statuses are Final unless otherwise noted.    Tests: (1) Renal Panel (10) ([id number])    Order Note: Performing Site: [facility] [address]   [place] [address].    Calcium, Serum            9.4 mg/dL                   (5.1-02.5)      Please note: The calcium reference range has been updated as of       02/21/2009.    Phosphorus, Serum         3.7 mg/dL                   (8.5-2.7)    Glucose, Serum            86 mg/dL                    (78-24)    BUN                       11 mg/dL                    (2-35)    Albumin, Serum            4.5 g/dL                    (3.6-1.4)    Potassium, Serum          4.5 mmol/L                  (3.5-5.2)    Sodium, Serum             141 mmol/L                  (135-145)    Chloride, Serum           102 mmol/L                  (97-108)    Creatinine, Serum    [L]  0.48 mg/dl                  (4.31-5.40)   Carbon Dioxide, Total                              30 mmol/L                   (20-32)    BUN/Creatinine Ratio      23                          (8-27)    Glom Filt Rate, Est       135.27 mL/min               (>59-)    If African American       163.95 mL/min               (>59-)    Note: An exclamation mark (!) indicates a result that was not dispersed into   the flowsheet.  Document Creation Date: 05/22/2009 8:49 AM  _______________________________________________________________________    (1) Order result status: Final  Collection or observation date-time: 05/20/2009 12:32  Requested date-time: 05/20/2009 12:32  Receipt date-time: 05/20/2009 12:32  Reported date-time: 05/21/2009 00:50:00  Referring Physician:    Ordering Physician:  Daine Gravel (parthan)  Specimen Source:   Source: Leverne Humbles Order Number: [id number])  Lab site:

## 2009-05-20 NOTE — Unmapped (Signed)
Signed by Rusty Aus MD on 05/25/2009 at 14:06:56  Patient: Sarah Huang  Note: All result statuses are Final unless otherwise noted.    Tests: (1) Urine Culture, Comprehensive [phone])    Order Note: Performing Site: [facility] [address] [place]   [address].  ! Source of Specimen        ur  ! URINE CULTURE, COMPREHENSIVE                              Final report    Note: An exclamation mark (!) indicates a result that was not dispersed into   the flowsheet.  Document Creation Date: 05/25/2009 12:19 PM  _______________________________________________________________________    (1) Order result status: Final  Collection or observation date-time: 05/20/2009 19:58  Requested date-time: 05/20/2009 19:58  Receipt date-time: 05/20/2009 19:58  Reported date-time: 05/24/2009 11:11:00  Referring Physician:    Ordering Physician:  Daine Gravel (parthan)  Specimen Source:   Source: Leverne Humbles Order Number: [id number])  Lab site:

## 2009-05-20 NOTE — Unmapped (Signed)
Signed by Daine Gravel MD on 05/20/2009 at 17:20:51      Reason for Visit   Chief Complaint: F/U  has bad allergies--taking Zyrtec D and Imitrex for migraine. Needs rf on her Imitrex.    History from: patient    Allergies  No Known Allergies    Medications     Vital Signs:   Wt: 180 lbs.      BMI: 32.00  BSA: 1.85  Wt chg (lbs): 3  Pulse: 60  BP: 144/90  Urine Dip: Done     Intake recorded by: Philipp Ovens MA on May 20, 2009 10:22 AM      History of Present Illness: bad allergies, taking zyrtec-D and imitrex for the HA migraine, typical migraine  BP higher than usual  also incr urin freq and nocturia and sharp pain feels like prior kidney stones, migratory sxs  yest fever and missed work, ST, chills and very stuffy head  today still fatigued  estimates temp was 101  still needs to f/up on cpap  migrianes very infreq, took imitrex yest  leg cramps lately, worried it's related to statin but no typical myalgias   PAST HISTORY  Past Medical History (reviewed - no changes required):  Hyperlipidemia, Kidney stones, dylipidemia, and depression.   Surgical History (reviewed - no changes required):  Tonsillectomy in the past.   Hysty/BSO--ectopic pregnancy/prolapse, hx sinus surg x4    Family History (reviewed - no changes required): Mother--DM, HTN, CAD, COPD, high chol  Aunt--breast ca  Father--CAD, COPD, high chol  Siblings--CAD, COPD  Mother - HTN, DIABETES, TRIPLE BYPASS  Father - EMPHYSEMA, CHF  Sister - 2- COPD, 1- BLOOD DISORDER  Brother - 2- MI, COPD, 1- TRIPLE BYPASS, 1- HEART DEFECT, OPEN HEART SURGERY  Social History (reviewed - no changes required): Marital Status: divorced,   Children: 2,   Alcohol Use: occasionally  Tobacco Usage:prior smoker    Review of Systems  Cardiovascular: Denies any specific issues at this time.   Respiratory: Denies any specific issues at this time.       Physical Examination:   BP: 144/  90    Additional Vitals:   BP: 148 / 94    Physical Exam- Detail:   General  Appearance: nad  Nose/Face: no sinus tend.  Oropharynx: clear, no adenop  Respiratory: clear  Abdomen: no CVAT      In office Procedures & Tests     Routine Urinalysis     Physical characteristics   Color: yellow  Appearance: clear    Chemical measurements   Glucose (mg/dL): negative  Bilirubin: negative  Ketone (mg/dL): negative  Spec. Gravity: 1.010  Blood: ++/mod  pH: 6.5  Protein (mg/dL): trace  Urobilinogen (mg/dL): normal  Nitrite: negative  Leukocytes: trace         New Problems:  NEPHROLITHIASIS (ICD-592.0)  MYALGIA/MYOSITIS NOS (ICD-729.1)  New Medications:  IMITREX 100 MG TABS (SUMATRIPTAN SUCCINATE) 1 by mouth daily as needed for migraine and may repeat in 2 hours if needed but no more than 2 in 24 hours period.  AMBIEN 5 MG TABS (ZOLPIDEM TARTRATE) 1/2 -1 by mouth at bedtime  ZOCOR 40 MG TABS (SIMVASTATIN) 1 by mouth daily  PROMETHAZINE HCL 25 MG TABS (PROMETHAZINE HCL) 1 by mouth every 6 hr as needed nausea/HA  FLONASE  SUSP (FLUTICASONE PROPIONATE SUSP) 1 p both nostrils twice a day prn      Preventive Maintenance       Coordinating Care Providers  Cardiologist: Dr. Jacqlyn Larsen            Prescriptions:  IMITREX 100 MG TABS (SUMATRIPTAN SUCCINATE) 1 by mouth daily as needed for migraine and may repeat in 2 hours if needed but no more than 2 in 24 hours period.  #12 x 1   Entered and Authorized by: Daine Gravel MD   Signed by: Daine Gravel MD on 05/20/2009   Method used: Print then Give to Patient   RxID: [id number]  FLONASE  SUSP (FLUTICASONE PROPIONATE SUSP) 1 p both nostrils twice a day prn  #1 x 1   Entered and Authorized by: Daine Gravel MD   Signed by: Daine Gravel MD on 05/20/2009   Method used: Print then Give to Patient   RxID: [id number]  PROMETHAZINE HCL 25 MG TABS (PROMETHAZINE HCL) 1 by mouth every 6 hr as needed nausea/HA  #15 x 1   Entered and Authorized by: Daine Gravel MD   Signed by: Daine Gravel MD on  05/20/2009   Method used: Print then Give to Patient   RxID: [id number]  ZOCOR 40 MG TABS (SIMVASTATIN) 1 by mouth daily  #30 x 1   Entered and Authorized by: Daine Gravel MD   Signed by: Daine Gravel MD on 05/20/2009   Method used: Print then Give to Patient   RxID: [id number]      Assessment and Plan     Problems   Status of Existing Problems:  Assessed NEPHROLITHIASIS as comment only - poss. recurrence, check [place] and urine - Daine Gravel MD  Assessed MYALGIA/MYOSITIS NOS as comment only - unlikely statin effect, more leg cramps, reduce statin as LDL v. low - Daine Gravel MD  Assessed SLEEP APNEA, OBSTRUCTIVE as comment only - use cpap, ambien rx to help her get used to it - Daine Gravel MD  Assessed ELEVATED BP READING WITHOUT DX HYPERTENSION as deteriorated - likely realted to D and imitrex, counselled - Daine Gravel MD  Assessed HYPERLIPIDEMIA as improved - Daine Gravel MD  New Problems:  Dx of NEPHROLITHIASIS (ICD-592.0)  Onset: 05/20/2009  Dx of MYALGIA/MYOSITIS NOS (ICD-729.1)  Onset: 05/20/2009    Medications   New Prescriptions/Refills:  IMITREX 100 MG TABS (SUMATRIPTAN SUCCINATE) 1 by mouth daily as needed for migraine and may repeat in 2 hours if needed but no more than 2 in 24 hours period.  #12 x 1, 05/20/2009, Daine Gravel MD  FLONASE  SUSP (FLUTICASONE PROPIONATE SUSP) 1 p both nostrils twice a day prn  #1 x 1, 05/20/2009, Daine Gravel MD  PROMETHAZINE HCL 25 MG TABS (PROMETHAZINE HCL) 1 by mouth every 6 hr as needed nausea/HA  #15 x 1, 05/20/2009, Daine Gravel MD  ZOCOR 40 MG TABS (SIMVASTATIN) 1 by mouth daily  #30 x 1, 05/20/2009, Daine Gravel MD    Today's Orders   Renal Function Panel   (KIDNEY) (10314) [CPT-80069]  Magnesium Level  (MG) (622)  [CPT-83735]  Creatine Kinase  (CK) (374) [CPT-82550]  UA Dipstick (Office) [CPT-81002]  Urine Culture & Sensitivity  [CPT-87086]  Ultrasound [IMS-11111]  99214 - Ofc Vst, Est Level IV [OAC-16606]    Patient Instructions   TAKE PHENERGAN AT FIRST SIGN OF MIGRAINE, CHECK BP BEFORE USING IMITREX AND DON'T USE IF OVER 140/90  STOP ZYRTEC D, USE PLAIN ZYRTEC AND FLONASE FOR ALLERGEIS  TAKE AMBIEN DAILY THE FIRST TWO WKS ON THE CPAP    Disposition:   in 1 month(s)  Preventive Maintenance       Process Orders  Check Orders Results:      Error: EMR Link Lab: An error occurred trying to run '[email or web address]' : The system cannot find the file specified.  Tests Sent for requisitioning (May 20, 2009 5:20 PM):      05/20/2009: EMR Link Lab -- Renal Function Panel   (KIDNEY) [phone]) [CPT-80069] (signed)      05/20/2009: EMR Link Lab -- Magnesium Level  (MG) (622)  [CPT-83735] (signed)      05/20/2009: EMR Link Lab -- Creatine Kinase  (CK) (374) [CPT-82550] (signed)      05/20/2009: EMR Link Lab -- Urine Culture & Sensitivity [CPT-87086] (signed)    ]

## 2009-05-20 NOTE — Unmapped (Signed)
Signed by Daine Gravel MD on 05/22/2009 at 15:04:10  Patient: Sarah Huang  Note: All result statuses are Final unless otherwise noted.    Tests: (1) Magnesium, Serum ([id number])    Order Note: Performing Site: [facility] [address]   [place] [address].    Magnesium, Serum          2.2 mg/dL                   (4.0-1.0)    Note: An exclamation mark (!) indicates a result that was not dispersed into   the flowsheet.  Document Creation Date: 05/22/2009 8:49 AM  _______________________________________________________________________    (1) Order result status: Final  Collection or observation date-time: 05/20/2009 12:32  Requested date-time: 05/20/2009 12:32  Receipt date-time: 05/20/2009 12:32  Reported date-time: 05/21/2009 00:50:00  Referring Physician:    Ordering Physician:  Daine Gravel (parthan)  Specimen Source:   Source: Leverne Humbles Order Number: [id number])  Lab site:

## 2009-05-20 NOTE — Unmapped (Signed)
Signed by Rusty Aus MD on 05/25/2009 at 13:57:56  Patient: Sarah Huang  Note: All result statuses are Final unless otherwise noted.    Tests: (1) Result [phone])    Order Note: [address]  [place], [place] [id number]   [phone] CLIA #:[id number]  ! Result 1                  Comment      Mixed urogenital flora      25,000-50,000 colony forming units per mL    Note: An exclamation mark (!) indicates a result that was not dispersed into   the flowsheet.  Document Creation Date: 05/25/2009 12:19 PM  _______________________________________________________________________    (1) Order result status: Final  Collection or observation date-time: 05/20/2009 19:58  Requested date-time: 05/20/2009 19:58  Receipt date-time: 05/20/2009 19:58  Reported date-time: 05/24/2009 11:11:00  Referring Physician:    Ordering Physician:  Daine Gravel (parthan)  Specimen Source:   Source: Leverne Humbles Order Number: [id number])  Lab site:

## 2009-05-22 ENCOUNTER — Inpatient Hospital Stay

## 2009-05-22 NOTE — Unmapped (Signed)
Signed by   LinkLogic on 05/22/2009 at 07:41:46  Patient: Sarah Huang  Note: All result statuses are Final unless otherwise noted.    Tests: (1) US-ABDOMEN COMPLETE ([id number])    Order Note: [facility] Accession Number: [id number]    Order Note:                               [facility]     Patient: Sarah Huang, Sarah Huang   DOB:     [birth year]   MRN:     [id number]   FIN:       [id number]   Accn#:   [id number]                                       Ultrasound     Exam                       Exam Date/Time       Ordering Physician   US-ABDOMEN COMPLETE        05/22/2009 06:42 EDT Riley Churches, Cheri Guppy                                                   P   US-RETROPERITONEAL         05/22/2009 06:41 EDT Daine Gravel   COMPLETE                                        P     Reason for Exam   (US-ABDOMEN COMPLETE)  abd pain   (US-RETROPERITONEAL COMPLETE)  abd pain, blood in urine     Report   Complete abdominal ultrasound and renal ultrasound on May 22, 2009.     Indication: Abdominal pain. Hematuria.     Findings:     The liver appears diffusely increased in echogenicity suggesting steatosis.   No focal masses are evident.     The gallbladder is normal in size and free of internal echoes. The bile   ducts are normal in caliber with the common duct measuring 3 mm.     No abnormalities of the pancreas, spleen, aorta, or vena cava are evident.     The right kidney measures 9.4 x 4.5 x 4.4 cm. The left kidney measures 11 x   4.4 x 4.1 cm. Echogenicity is normal. No mass lesions or hydronephrosis is   evident. There is a 3 mm echogenic focus in the right kidney without   shadowing, possibly representing a nonobstructing stone.     Impression: Abdominal ultrasound     Hepatic steatosis.     Impression: Renal ultrasound     Equivocal small nonobstructing right renal calculus.   ********** VERIFIED REPORT **********     Dictated: 05/22/2009 7:44 am      MOULTON M.D., Hervey Ard   Signed (Electronic  Signature):  MOULTON M.D., Christiane Ha S        05/22/09   7:44     Technologist: ECC    Order Note:   EMR Routing to: Hershal Coria - ordering - [id number]  Note: An exclamation mark (!) indicates a result that was not dispersed into   the flowsheet.  Document Creation Date: 05/22/2009 7:41 AM  _______________________________________________________________________    (1) Order result status: Final  Collection or observation date-time: 05/22/2009 06:42:00  Requested date-time: 05/22/2009 06:30:00  Receipt date-time:   Reported date-time: 05/22/2009 07:44:20  Referring Physician: Daine Gravel  Ordering Physician: Daine Gravel [facility])  Specimen Source: RAD&Rad Type  Source: QRS  Filler Order Number: [id number]  Lab site: [facility]

## 2009-05-22 NOTE — Unmapped (Signed)
Signed by   LinkLogic on 05/22/2009 at 07:41:45  Patient: Sarah Huang  Note: All result statuses are Final unless otherwise noted.    Tests: (1) US-RETROPERITONEAL COMPLETE ([id number])    Order Note: [facility] Accession Number: [id number]    Order Note:                               [facility]     Patient: Sarah Huang, Sarah Huang   DOB:     [birth year]   MRN:     [id number]   FIN:       [id number]   Accn#:   [id number]                                       Ultrasound     Exam                       Exam Date/Time       Ordering Physician   US-ABDOMEN COMPLETE        05/22/2009 06:42 EDT Riley Churches, Cheri Guppy                                                   P   US-RETROPERITONEAL         05/22/2009 06:41 EDT Daine Gravel   COMPLETE                                        P     Reason for Exam   (US-ABDOMEN COMPLETE)  abd pain   (US-RETROPERITONEAL COMPLETE)  abd pain, blood in urine     Report   Complete abdominal ultrasound and renal ultrasound on May 22, 2009.     Indication: Abdominal pain. Hematuria.     Findings:     The liver appears diffusely increased in echogenicity suggesting steatosis.   No focal masses are evident.     The gallbladder is normal in size and free of internal echoes. The bile   ducts are normal in caliber with the common duct measuring 3 mm.     No abnormalities of the pancreas, spleen, aorta, or vena cava are evident.     The right kidney measures 9.4 x 4.5 x 4.4 cm. The left kidney measures 11 x   4.4 x 4.1 cm. Echogenicity is normal. No mass lesions or hydronephrosis is   evident. There is a 3 mm echogenic focus in the right kidney without   shadowing, possibly representing a nonobstructing stone.     Impression: Abdominal ultrasound     Hepatic steatosis.     Impression: Renal ultrasound     Equivocal small nonobstructing right renal calculus.   ********** VERIFIED REPORT **********     Dictated: 05/22/2009 7:44 am      MOULTON M.D., Hervey Ard   Signed (Electronic  Signature):  MOULTON M.D., Christiane Ha S        05/22/09   7:44     Technologist: ECC    Order Note:   EMR Routing to: Hershal Coria - ordering - [id number]  Note: An exclamation mark (!) indicates a result that was not dispersed into   the flowsheet.  Document Creation Date: 05/22/2009 7:41 AM  _______________________________________________________________________    (1) Order result status: Final  Collection or observation date-time: 05/22/2009 06:42:00  Requested date-time: 05/22/2009 06:30:00  Receipt date-time:   Reported date-time: 05/22/2009 07:44:20  Referring Physician: Daine Gravel  Ordering Physician: Daine Gravel [facility])  Specimen Source: RAD&Rad Type  Source: QRS  Filler Order Number: [id number]  Lab site: [facility]

## 2009-05-26 NOTE — Unmapped (Signed)
Signed by Daine Gravel MD on 05/26/2009 at 17:50:00    PHONE NOTE    Reason for Call: called and discussed results, pt to push liqs, strain urine and see me in f/up soon, will plan recheck urine--consider hctz to prevent recurrent stones

## 2009-05-28 NOTE — Unmapped (Signed)
Signed by Maxwell Marion MD on 05/28/2009 at 00:00:00  Compliance Report      Imported By: Coletta Memos 06/04/2009 23:05:12    _____________________________________________________________________    External Attachment:    Please see [facility] EMR for this document.

## 2009-05-29 NOTE — Unmapped (Signed)
Signed by Exie Parody RPSGT on 05/29/2009 at 13:42:55      CPAP Clinic Visit   The patient is here for PAP management per the plan of care discussed with the provider at the previous outpatient visit.    The patient has PAP equipment (Respironics REMStar Auto) at cm H2O, heated humidification set at [employer] [facility] [employer] adherence data card.  The patient used this since 05/22/2009.  The interface is Respironics Comfort gel full face mask.    The patient was asked if there are any specific questions or concerns on the use of the PAP equipment.    The patient was asked to demonstrate proper use of the PAP equipment.    Proper use of humidifier, headgear, interface, accessories and ramp were thoroughly reviewed and demonstrated to the patient.    Proper use of the downloadable data card and the significance of its reports were reviewed at length with the patient (insurance coverage  guidelines as well as clinical relevance were also discussed).    Mask fitting was carefully performed, and the patient chose The patient was asked in details about possible PAP side effects, including dry eyes, dry mouth, aerophagia, dyspnea, air leak)..    The patient was asked in details about possible PAP side effects, including dry eyes, dry mouth, aerophagia, dyspnea, air leak).    The DME company supportive role was explained to the patient. The patient was encouraged to contact the DME company or the sleep  medicine center for any further concerns related to PAP use.    The pathophysiology of sleep disordered breathing, as well as its relationship with cardiovascular morbidity and mortality was reviewed  with the patient.    The patient will be seen in follow up in the outpatient clinic on 07/22/2009    Comments: Patient compliant with therapy.  Cleaning and maintenance of equipment was reviewed.  Patient appears to have a good grasp and understanding of equipment cleaning and maintenance.  Questions asked were  answered to patient's stated satisfaction.  Patient's DME company is [employer] in [place], patient expressed some reservations regarding her DME and stated she will be contacting her insurance to inquire about other covered DME's and possible alternative choices.  Patient's main concern with [employer] is their unprofessionalism regarding her first encounter.  Patient verbalized understanding of today's visit, explanations and review.  There were no further questions, concerns or comments at this time.        Technician: Tammy Steck,RRT,RPSGT    EPWORTH SLEEPINESS SCALE   1. How likely are you to doze off or fall asleep while sitting and reading?        - patient indicates a score of:  3  2. How likely are you to doze off or fall asleep while watching television?        - patient indicates a score of:  3  3. How likely are you to doze off or fall asleep while in a theater or meeting?        - patient indicates a score of:  2  4. How likely are you to doze off or fall asleep while traveling as a passenger?        - patient indicates a score of:  0  5. How likely are you to doze off or fall asleep while resting in the afternoon?        - patient indicates a score of:  3  6.  How likely are you to doze off or fall asleep while sitting & talking to someone?        - patient indicates a score of:  0  7. How likely are you to doze off or fall asleep while sitting quietly after a meal?        - patient indicates a score of:  3  8. How likely are you to doze off or fall asleep while sitting in a car stopped in traffic?        - patient indicates a score of:  1                                                            Total Score: 15              CC:

## 2009-06-25 NOTE — Unmapped (Signed)
Signed by Kerrin Mo MA on 06/25/2009 at 09:46:53                   [facility]         [address]         [place], [place] [address]         p [phone] f [phone]                     June 25, 2009        Sarah Huang  [address]   [place], [place] [address]      This notice is to remind you that you are past due for routine labs that were ordered by your doctor.  Please contact the office at [phone] when you are ready to come in for this and we'll make sure the orders are at the front desk waiting for you.      The lab is open from 7:30 AM until 5:30 PM.  Please call if you have any questions about this.        Sincerely,      Daine Gravel, MD

## 2009-07-06 NOTE — Unmapped (Signed)
Signed by Maxwell Marion MD on 07/06/2009 at 00:00:00  Physician Order      Imported By: Betsey Amen 07/20/2009 10:54:47    _____________________________________________________________________    External Attachment:    Please see [facility] EMR for this document.

## 2009-07-09 NOTE — Unmapped (Signed)
Signed by April A Napier MA on 07/09/2009 at 11:46:06    [facility] - PRELOAD      Preload Clinical Lists   Problems:   NEPHROLITHIASIS (ICD-592.0)  MYALGIA/MYOSITIS NOS (ICD-729.1)  SLEEP APNEA, OBSTRUCTIVE (ICD-327.23)  HYPERSOMNIA (ICD-780.54)  ELEVATED BP READING WITHOUT DX HYPERTENSION (ICD-796.2)  GERD (ICD-530.81)  SLEEP APNEA (ICD-780.57)  ROUTINE GENERAL MEDICAL EXAM@HEALTH  CARE FACL (ICD-V70.0)  FATIGUE (ICD-780.79)  HYPERLIPIDEMIA (ICD-272.4)    Medications:   ASPIRIN 81 MG TABS (ASPIRIN) one by mouth daily  ZANTAC 75  TABS (RANITIDINE HCL TABS) 1 by mouth twice a day  IMITREX 100 MG TABS (SUMATRIPTAN SUCCINATE) 1 by mouth daily as needed for migraine and may repeat in 2 hours if needed but no more than 2 in 24 hours period.  AMBIEN 5 MG TABS (ZOLPIDEM TARTRATE) 1/2 -1 by mouth at bedtime  ZOCOR 40 MG TABS (SIMVASTATIN) 1 by mouth daily  PROMETHAZINE HCL 25 MG TABS (PROMETHAZINE HCL) 1 by mouth every 6 hr as needed nausea/HA  FLONASE  SUSP (FLUTICASONE PROPIONATE SUSP) 1 p both nostrils twice a day prn      Allergies:  No Known AllergiesDirectives:   DISCLOSURE TO FAMILY AND FRIENDS      PAST HISTORY  Past Medical History (reviewed - no changes required):  Hyperlipidemia, Kidney stones, dylipidemia, and depression.   Surgical History (reviewed - no changes required):  Tonsillectomy in the past.   Hysty/BSO--ectopic pregnancy/prolapse, hx sinus surg x4    Family History (reviewed - no changes required): Mother--DM, HTN, CAD, COPD, high chol  Aunt--breast ca  Father--CAD, COPD, high chol  Siblings--CAD, COPD  Mother - HTN, DIABETES, TRIPLE BYPASS  Father - EMPHYSEMA, CHF  Sister - 2- COPD, 1- BLOOD DISORDER  Brother - 2- MI, COPD, 1- TRIPLE BYPASS, 1- HEART DEFECT, OPEN HEART SURGERY  Social History (reviewed - no changes required): Marital Status: divorced,   Children: 2,   Alcohol Use: occasionally  Tobacco Usage:prior smoker    Previous Echocardiogram:  02/27/2009     Study Conclusion    Left  Ventricle: The cavity size was normal. Wall thickness was normal. Systolic function was normal. The estimated ejection fraction was in the range of 55%-65%. Wall motion was normal: there were no regional wall motion abnormalities.    Previous Stress Echo:  02/27/2009      There were no stress arrhythmias or conduction abnormalities. The stress ECG was negative for ischemia.    Previous Sleep Study:  05/07/2009                  NOCTURNAL POLYSOMNOGRAPHY REPORT WITH CPAP TITRATION    Impressions & Recommendations:    This study shows improvement of obstructive sleep apnea at a CPAP setting of 8 cm H2O.  REM but not supine sleep was seen at this setting.  A Mirage Quattro FX full face interface, size medium was used during the study. Compliance with this regimen and cooperation with the home equipment company is strongly recommended.  Close clinical follow-up in an effort to address possible nasal or facial side effects is encouraged. If the patient???s symptoms recur or do not completely resolve, a repeat titration study may be indicated.           04/27/2009               NOCTURNAL POLYSOMNOGRAPHY REPORT      Impressions & Recommendations:      This study shows obstructive sleep apnea, with an apnea-hypopnea  index of 8.7 events per hour and an oxygen saturation nadir of 87%.  Respiratory events were most prominent during REM sleep.  Concurrent REM and supine sleep were recorded.  A titration study with continuous positive airway pressure treatment is recommended.          Coronary Risk Factor Assessment   Cigarette Smoker: prior smoker       Cardiology HPI:   02/23/2009                      History of Present Illness       Dear Dr. Riley Churches,  I had the pleasure of seeing your patient, Sarah Huang, for the following medical problems:  1. Chest pain, which appears to be atypical, but she does have strong family history of CAD in multiple family members  2. Hyperlipidemia, her last cholesterol was 250, but she has not  been taking statins       Vital Signs   Height: 63 in.  Weight: 176.0 lbs.   Weight change of 176 lbs since previous visit.    BMI (in-lb): 31.29  BSA (m2): 1.83  Temperature: 98.2 degrees  F (oral)  Pulse rate: 68    Pulse rhythm: regular    Respirations: 18  Pain Level: 0  Note: Pain level is assessed on a 10 point scale. 0 being pain free, 1 being barely noticeable pain and 10 being the worst pain patient has ever felt.  BP #1: 136 / 83 mm Hg  Cuff Size: Std   O2 Saturation: 97% room air     Cardiology Problem List:   Hyperlipidemia    Cardiology Assessment:   She does have strong family history of CAD and has been complaining of chest pain. Her cholestol is also elevated      Cardiology Plan:   Stress echo to evaluate for ischemia. Zocor 40 mg by mouth daily and add aspirin 81 mg by mouth daily.     Medications   New Prescriptions/Refills:  ZOCOR 80 MG TABS (SIMVASTATIN) one by mouth every evening  #30 x 5, 02/23/2009, April A Napier MA  ASPIRIN 81 MG TABS (ASPIRIN) one by mouth daily  #30 x 5, 02/23/2009, April A Napier MA      Today's Orders   Echocardiogram, Treadmill Stress [CPT-93350]  CBC + plat + Diff  (6399) [*CPT-85025]  Lipid Profile   (FATS) (7600) [CPT-80061]  TSH   (TSH) (899) [WJX-91478]  Thyroid Profile (T3,T4,TSH)  (7444) [CPT-80092]                ]

## 2009-07-10 NOTE — Unmapped (Signed)
Signed by Rusty Aus MD on 07/10/2009 at 00:00:00  [facility] - Cardiololgy      Imported By: Maryellen Pile 08/20/2009 13:26:17    _____________________________________________________________________    External Attachment:    Please see [facility] EMR for this document.

## 2009-07-23 NOTE — Unmapped (Signed)
Signed by April A Napier MA on 07/23/2009 at 15:06:47    Printed Handout:  - Complete Medication List

## 2009-07-27 NOTE — Unmapped (Signed)
Signed by   LinkLogic on 07/27/2009 at 10:08:28    Appointment status changed to no show by  LinkLogic on 07/27/2009 10:08 AM.    No Show Comments  ----------------      Appointment Information  -----------------------  Appt Type:         Date:  Monday, July 27, 2009       Time:  8:30 AM for 15 min    Urgency:  Routine    Made By:  Jannett Celestine   To Visit:  Doree Barthel MD     Reason:      Appt Comments  -------------  -- 07/27/09 10:08: ( ) NO SHOW --  4 MON F/U\\ESTABLISHED PT IN BUILDING\\Referred by: ; [facility] (MA4) ESTABLISHED PT IN BUILDING    -- 07/27/09 9:46: (122ANAPIER) NO SHOW --  4 MON F/U\\ESTABLISHED PT IN BUILDING\\Referred by: ; [facility] (MA4) ESTABLISHED PT

## 2009-07-27 NOTE — Unmapped (Signed)
Signed by   LinkLogic on 07/27/2009 at 09:46:12    Appointment status changed to no show by  LinkLogic on 07/27/2009 9:46 AM.    No Show Comments  ----------------      Appointment Information  -----------------------  Appt Type:         Date:  Monday, July 27, 2009       Time:  8:30 AM for 15 min    Urgency:  Routine    Made By:  Jannett Celestine   To Visit:  Doree Barthel MD     Reason:      Appt Comments  -------------  -- 07/27/09 9:46: (122ANAPIER) NO SHOW --  4 MON F/U\\ESTABLISHED PT IN BUILDING\\Referred by: ; [facility] (MA4) ESTABLISHED PT IN BUILDING    -- 06/19/09 12:39: (122CBRANDON) BOOKED --  Routine  at 07/27/2009 8:30 AM for 15 min  4 MON F/U; EPIB (MA4) ESTABLISHED

## 2009-08-12 NOTE — Unmapped (Signed)
Signed by Guillermina City on 08/13/2009 at 10:48:19                        [facility]         Cardiology         [address]         [place], [place] [address]         p [phone] f [phone]         [email or web address]          August 12, 2009              RE: [name]  DOB:   1955-12-11        Dear Dr. Riley Churches, MD,    I had the pleasure of seeing Sarah Huang in the office today for a cardiology follow-up. Enclosed please find my office note and recommendations.  Should you have any further questions please do not hesitate to contact me.    I would like to thank you for allowing me to participate in this patient's care.    Best personal regards.          Sincerely,        Rusty Aus, MD

## 2009-08-12 NOTE — Unmapped (Signed)
Signed by Rusty Aus MD on 08/13/2009 at 09:47:51    [facility]  CARDIOLOGY FOLLOW-UP VISIT    History of Present Illness   02/23/2009                      History of Present Illness       Dear Dr. Riley Churches,  I had the pleasure of seeing your patient, [name], for the following medical problems:  1. Chest pain, which appears to be atypical, but she does have strong family history of CAD in multiple family members  2. Hyperlipidemia, her last cholesterol was 250, but she has not been taking statins.  Because of chest pain we performed stress ech, which demonstrated normal LV function and there was no evidence for ischmia.         Cardiology Problem List:   Hyperlipidemia           PAST HISTORY  Past Medical History (reviewed - no changes required):  Hyperlipidemia, Kidney stones, dylipidemia, and depression.   Surgical History (reviewed - no changes required):  Tonsillectomy in the past.   Hysty/BSO--ectopic pregnancy/prolapse, hx sinus surg x4    Family History (reviewed - no changes required): Mother--DM, HTN, CAD, COPD, high chol  Aunt--breast ca  Father--CAD, COPD, high chol  Siblings--CAD, COPD  Mother - HTN, DIABETES, TRIPLE BYPASS  Father - EMPHYSEMA, CHF  Sister - 2- COPD, 1- BLOOD DISORDER  Brother - 2- MI, COPD, 1- TRIPLE BYPASS, 1- HEART DEFECT, OPEN HEART SURGERY  Social History (reviewed - no changes required): Marital Status: divorced,   Children: 2,   Alcohol Use: occasionally  Tobacco Usage:prior smoker    Coronary Risk Factor Assessment   Cigarette Smoker: prior smoker     Hemoglobin: 13.7 (02/23/2009 9:34:00 AM)  Hematocrit: 42.5 (02/23/2009 9:34:00 AM)  MCV: 92 (02/23/2009 9:34:00 AM)  MCH: 29.7 (02/23/2009 9:34:00 AM)  WBC: 6.8 X10E3/UL (02/23/2009 9:34:00 AM)  Platelets: 278 X10E3/UL (02/23/2009 9:34:00 AM)  T4, total: 7.8 (02/23/2009 9:34:00 AM)  Serum Glucose: 86 (05/20/2009 12:32:00 PM)  Total Protein: 7.2 (02/23/2009 9:34:00 AM)  Albumin: 4.5 (05/20/2009 12:32:00  PM)  AST: 19 (05/13/2009 9:44:00 AM)  ALT: 20 (05/13/2009 9:44:00 AM)  Alkaline Phosphatase: 109 (02/23/2009 9:34:00 AM)  Total Bilirubin: 0.3 (02/23/2009 9:34:00 AM)  Serum Cholesterol 146 (05/13/2009 9:44:00 AM)  LDL: 66 (05/13/2009 9:44:00 AM)  HDL: 58 (05/13/2009 9:44:00 AM)  Triglyceride: 112 (05/13/2009 9:44:00 AM)  Sodium: 141 (05/20/2009 12:32:00 PM)  Potassium: 4.5 (05/20/2009 12:32:00 PM)  Chloride: 102 (05/20/2009 12:32:00 PM)  BUN: 11 (05/20/2009 12:32:00 PM)  Creatinine: 0.48 (05/20/2009 12:32:00 PM)  Serum Calcium: 9.4 (05/20/2009 12:32:00 PM)  Serum Magnesium: 2.2 (05/20/2009 12:32:00 PM)  Serum Phospate: 3.7 (05/20/2009 12:32:00 PM)    Review of Systems  See scanned document dated August 12, 2009.  The Review of Systems has been reviewed and the patient advised to follow up with PCP for non-cardiac problems.      Intake   Intake Comments: follow up       Medications   ASPIRIN 81 MG TABS (ASPIRIN) one by mouth daily  IMITREX 100 MG TABS (SUMATRIPTAN SUCCINATE) 1 by mouth daily as needed for migraine and may repeat in 2 hours if needed but no more than 2 in 24 hours period.  ZOCOR 40 MG TABS (SIMVASTATIN) 1 by mouth daily  PROMETHAZINE HCL 25 MG TABS (PROMETHAZINE HCL) 1 by mouth every 6 hr as needed nausea/HA  [facility]  SUSP (FLUTICASONE PROPIONATE SUSP) 1 p both nostrils twice a day prn  AMBIEN 5 MG TABS (ZOLPIDEM TARTRATE) take one by mouth at bedtime as needed    Allergies  No Known Allergies    Vital Signs   Height: 63 in.  Weight: 183 lbs. Pulse rate: 74    Pulse rhythm: regular   BP #1: 128 / 74 mm Hg   O2 Saturation: 97%   Intake recorded by: Dickey Gave LPN  August 12, 2009 1:19 PM               Medications reviewed, updated and verified with patient or patient representative.  Previous Screening:   Screening for unhealthy alcohol use performed. (04/15/2009 2:56:00 PM)  Women (Any Age) or Men (over Age 20): nondrinker    Depression Screening:     During the past month,   Have you often been  bothered by feeling down, depressed or hopeless? No  Have you often been bothered by little interest or pleasure in doing things? No      PHYSICAL EXAMINATION  Constitutional: alert, no acute distress, well hydrated, well developed, well nourished.   Skin: normal color, no rashes, no unusual bruising, warm to touch.   Head: atraumatic, normocephalic.   Eyes: pupils equal, no injection, no icterus.   Ears/Nose/Throat: external ears normal, external nose normal, hearing normal.   Neck: no carotid bruits, no thyromegaly.   Chest: no deformities, no apparent respiratory distress, normal percussion, no chest wall tenderness, clear to auscultation, normal breath sounds.   Neck Veins: not distended.   Carotids: normal upstroke, no bruits.   Palpation: normal.   S1 S2: normal S1 S2.   Extra Sounds: no gallop or rub or click.   Rhythm: regular rate and rhythm.   Ectopy: without ectopy.   Bilateral Lower Extremity Edema: none.   Murmur: none.   Carotid - Right: normal.   Carotid - Left: normal.   Radial - Right: normal.   Radial - Left: normal.   Femoral - Right: normal.   Femoral - Left: normal.   Popliteal - Right: normal.   Popliteal - Left: normal.   Posterior Tibial - Right: normal.   Posterior Tibial - Left: normal.   Dorsalis Pedis - Right: normal.   Dorsalis Pedis - Left: normal.   Abdomen: nondistended, nontender, normal BS, no organomegaly, no prominent aortic pulsation.   Spine: normal mobility, no deformities.   Extremities: full joint motion, no deformities.   Neuro: cranial nerves grossly intact, non focal.   Psych: affect and mood appropriate, normal interaction.     EKG Interpretation:   No EKG done at this visit.      Assessment and Plan     Problems Assessed Today:   HYPERLIPIDEMIA (ICD-272.4)  FAMILY HX CARDIOVASCULAR DISEASE (ICD-V17.49)  CHEST PAIN PRECORDIAL (ICD-786.51)  FATIGUE (ICD-780.79)  SLEEP APNEA (ICD-780.57)    Assessment   This is a 54 year old female was presented with chest pain. Stress echo  demonstrated normal LV function and there was no evidence for ischemia. I reassured her that her chest pain is non-cardiac and encouaged her to continue to exercise and to continue to follow-up with you for the optimization of medical therapy.      Plan   As above    Medications   New Prescriptions/Refills:  AMBIEN 5 MG TABS (ZOLPIDEM TARTRATE) take one by mouth at bedtime as needed  #30 x 0, 08/12/2009, Rhonda Astrop LPN  Prescriptions:  AMBIEN 5 MG TABS (ZOLPIDEM TARTRATE) take one by mouth at bedtime as needed  #30 x 0   Entered by: Dickey Gave LPN   Authorized by: Rusty Aus MD   Signed by: Dickey Gave LPN on 16/07/3708   Method used: Print then Give to Patient   RxID: [id number]      ]

## 2009-08-18 NOTE — Unmapped (Signed)
Signed by Daine Gravel MD on 08/18/2009 at 14:05:07      Reason for Visit   Chief Complaint: Follow up    History from: patient    Allergies  No Known Allergies    Medications   ASPIRIN 81 MG TABS (ASPIRIN) one by mouth daily  IMITREX 100 MG TABS (SUMATRIPTAN SUCCINATE) 1 by mouth daily as needed for migraine and may repeat in 2 hours if needed but no more than 2 in 24 hours period.  ZOCOR 40 MG TABS (SIMVASTATIN) 1 by mouth daily  PROMETHAZINE HCL 25 MG TABS (PROMETHAZINE HCL) 1 by mouth every 6 hr as needed nausea/HA  FLONASE  SUSP (FLUTICASONE PROPIONATE SUSP) 1 p both nostrils twice a day prn  AMBIEN 5 MG TABS (ZOLPIDEM TARTRATE) take one by mouth at bedtime as needed       Vital Signs:   Wt: 182 lbs.      BMI: 32.36  BSA: 1.86  Wt chg (lbs): -1  Pulse: 60 (regular)  BP: 124/80    Intake recorded by: Malleri Zoutis MA on August 18, 2009 1:33 PM      History of Present Illness:   also has qus re ?fatty liver on US--f/up HTN  still some fatigue, if doesnt' eat on time has some dizziness when hungry  still occ L flank pain, hasn't passed any stone, pain very transient  bad GERD lately, drinkinglots  of green tea  on CPAP now for OSA  need RF ambien  hard to sleep  also some fpoot rash  heeel pain still a big problem  no more CP  no myalgias   PAST HISTORY  Past Medical History (reviewed - no changes required):  Hyperlipidemia, Kidney stones, dylipidemia, and depression.   Surgical History (reviewed - no changes required):  Tonsillectomy in the past.   Hysty/BSO--ectopic pregnancy/prolapse, hx sinus surg x4    Family History (reviewed - no changes required): Mother--DM, HTN, CAD, COPD, high chol  Aunt--breast ca  Father--CAD, COPD, high chol  Siblings--CAD, COPD  Mother - HTN, DIABETES, TRIPLE BYPASS  Father - EMPHYSEMA, CHF  Sister - 2- COPD, 1- BLOOD DISORDER  Brother - 2- MI, COPD, 1- TRIPLE BYPASS, 1- HEART DEFECT, OPEN HEART SURGERY  Social History (reviewed - no changes required): Marital Status:  divorced,   Children: 2,   Alcohol Use: occasionally  Tobacco Usage:prior smoker    Review of Systems  Cardiovascular: Denies any specific issues at this time.   Respiratory: Denies any specific issues at this time.       Physical Examination:   BP: 124/  80    Additional Vitals:   BP: 148 / 84    Physical Exam- Detail:   General Appearance: alert, no acute distress, well hydrated, well developed, well nourished.   Skin: mild tinea pedis  Respiratory: clear  Cardiac: RRR no m/r/g, no edema  Vascular: no edema  Musculoskeletal: +tend. L Achilles tendon           New Problems:  FATTY LIVER DISEASE (ICD-571.8)  PLANTAR FASCIITIS, LEFT (ICD-728.71)  INSOMNIA (ICD-780.52)  New Medications:  PRILOSEC 20 MG CPDR (OMEPRAZOLE) 1 by mouth daily in am      Preventive Maintenance       Coordinating Care Providers   Cardiologist: Dr. Jacqlyn Larsen            Prescriptions:  AMBIEN 5 MG TABS (ZOLPIDEM TARTRATE) take one by mouth at bedtime as needed  #30 x 3  Entered and Authorized by: Daine Gravel MD   Signed by: Daine Gravel MD on 08/18/2009   Method used: Handwritten   RxID: [id number]  FLONASE  SUSP (FLUTICASONE PROPIONATE SUSP) 1 p both nostrils twice a day prn  #1 x 5   Entered and Authorized by: Daine Gravel MD   Signed by: Daine Gravel MD on 08/18/2009   Method used: Print then Give to Patient   RxID: [id number]  PROMETHAZINE HCL 25 MG TABS (PROMETHAZINE HCL) 1 by mouth every 6 hr as needed nausea/HA  #15 x 1   Entered and Authorized by: Daine Gravel MD   Signed by: Daine Gravel MD on 08/18/2009   Method used: Print then Give to Patient   RxID: [id number]  ZOCOR 40 MG TABS (SIMVASTATIN) 1 by mouth daily  #30 x 5   Entered and Authorized by: Daine Gravel MD   Signed by: Daine Gravel MD on 08/18/2009   Method used: Print then Give to Patient   RxID: [id number]  IMITREX 100 MG TABS (SUMATRIPTAN SUCCINATE) 1 by mouth  daily as needed for migraine and may repeat in 2 hours if needed but no more than 2 in 24 hours period.  #12 x 1   Entered and Authorized by: Daine Gravel MD   Signed by: Daine Gravel MD on 08/18/2009   Method used: Print then Give to Patient   RxID: [id number]  ASPIRIN 81 MG TABS (ASPIRIN) one by mouth daily  #30 x 5   Entered and Authorized by: Daine Gravel MD   Signed by: Daine Gravel MD on 08/18/2009   Method used: Print then Give to Patient   RxID: [id number]  PRILOSEC 20 MG CPDR (OMEPRAZOLE) 1 by mouth daily in am  #30 x 5   Entered and Authorized by: Daine Gravel MD   Signed by: Daine Gravel MD on 08/18/2009   Method used: Print then Give to Patient   RxID: [id number]      Assessment and Plan     Problems   Status of Existing Problems:  Assessed ELEVATED BP READING WITHOUT DX HYPERTENSION as unchanged - Daine Gravel MD  Assessed GERD as deteriorated - counselled re triggers - Daine Gravel MD  Assessed INSOMNIA as comment only - counselled - Daine Gravel MD  Assessed PLANTAR FASCIITIS, LEFT as comment only - counselled  - Daine Gravel MD  Assessed FATTY LIVER DISEASE as comment only - discussed measures to help, reassured of nl hepatic functn - Daine Gravel MD  New Problems:  Dx of FATTY LIVER DISEASE (ICD-571.8)  Onset: 08/18/2009  Dx of PLANTAR FASCIITIS, LEFT (ICD-728.71)  Onset: 08/18/2009  Dx of INSOMNIA (ICD-780.52)  Onset: 08/18/2009    Medications   New Prescriptions/Refills:  AMBIEN 5 MG TABS (ZOLPIDEM TARTRATE) take one by mouth at bedtime as needed  #30 x 3, 08/18/2009, Daine Gravel MD  FLONASE  SUSP (FLUTICASONE PROPIONATE SUSP) 1 p both nostrils twice a day prn  #1 x 5, 08/18/2009, Daine Gravel MD  PROMETHAZINE HCL 25 MG TABS (PROMETHAZINE HCL) 1 by mouth every 6 hr as needed nausea/HA  #15 x 1, 08/18/2009, Daine Gravel  MD  ZOCOR 40 MG TABS (SIMVASTATIN) 1 by mouth daily  #30 x 5, 08/18/2009, Daine Gravel MD  IMITREX 100 MG TABS (SUMATRIPTAN SUCCINATE) 1 by mouth daily as needed for migraine and may repeat in 2 hours if needed but no more than 2 in 24 hours period.  #12 x 1, 08/18/2009, Daine Gravel MD  ASPIRIN 81 MG  TABS (ASPIRIN) one by mouth daily  #30 x 5, 08/18/2009, Daine Gravel MD  PRILOSEC 20 MG CPDR (OMEPRAZOLE) 1 by mouth daily in am  #30 x 5, 08/18/2009, Daine Gravel MD    Today's Orders   [phone] - Ofc Vst, Est Level IV Maela.Pennant                ]

## 2009-08-18 NOTE — Unmapped (Signed)
Signed by Daine Gravel MD on 08/18/2009 at 14:00:07    Printed Handout:  - SLEEP HYGIENE

## 2009-09-04 NOTE — Unmapped (Signed)
Signed by Kerrin Mo MA on 09/04/2009 at 09:53:25    PHONE NOTE  Call back at Home Phone: [phone]  Call for: Partha    Reason for Call: Pt pinched nerve in neck last week-not getting any better --requesting appt.      Initial call taken by: Tilford Pillar,  September 04, 2009 9:24 AM      FOLLOW UP  Per Dr. Deno Etienne ok to add at 11am. pt informed. APPT SCHEDULED.  Follow-up by:  Malleri Zoutis MA,  September 04, 2009 9:53 AM

## 2009-09-04 NOTE — Unmapped (Signed)
Signed by Daine Gravel MD on 09/04/2009 at 13:15:30      Reason for Visit   Chief Complaint: Pt states she injured neck 1 week ago and has continuly gotten worse.    History from: patient    Allergies  No Known Allergies    Medications   ASPIRIN 81 MG TABS (ASPIRIN) one by mouth daily  IMITREX 100 MG TABS (SUMATRIPTAN SUCCINATE) 1 by mouth daily as needed for migraine and may repeat in 2 hours if needed but no more than 2 in 24 hours period.  ZOCOR 40 MG TABS (SIMVASTATIN) 1 by mouth daily  PROMETHAZINE HCL 25 MG TABS (PROMETHAZINE HCL) 1 by mouth every 6 hr as needed nausea/HA  FLONASE  SUSP (FLUTICASONE PROPIONATE SUSP) 1 p both nostrils twice a day prn  AMBIEN 5 MG TABS (ZOLPIDEM TARTRATE) take one by mouth at bedtime as needed  PRILOSEC 20 MG CPDR (OMEPRAZOLE) 1 by mouth daily in am       Vital Signs:   Wt: 186 lbs.      BMI: 33.07  BSA: 1.88  Wt chg (lbs): 4  Pulse: 60 (regular)  BP: 142/86    Intake recorded by: Malleri Zoutis MA on September 04, 2009 11:18 AM      History of Present Illness: had been tubing and got whipped off the tube a wk ago and grad worsening  pain down L neck and shoulder, no arm pain         Physical Examination:   BP: 142/  86    Physical Exam- Detail:   General Appearance: in obvious discomfort  Musculoskeletal: L paraspinal/trapezius  mus. spasm and mild  tend.             New Problems:  NECK PAIN, LEFT (ICD-723.1)  New Medications:  NAPROSYN 500 MG TABS (NAPROXEN) 1 by mouth twice a day w/food for pain      Preventive Maintenance       Coordinating Care Providers   Cardiologist: Dr. Jacqlyn Larsen            Prescriptions:  NAPROSYN 500 MG TABS (NAPROXEN) 1 by mouth twice a day w/food for pain  #60 x 0   Entered and Authorized by: Daine Gravel MD   Signed by: Daine Gravel MD on 09/04/2009   Method used: Print then Give to Patient   RxID: [id number]      Assessment and Plan     Problems   Status of Existing Problems:  Assessed NECK PAIN, LEFT as comment  only - muscular and counselled, heat and exercise - Daine Gravel MD  New Problems:  Dx of NECK PAIN, LEFT (ICD-723.1)  Onset: 09/04/2009    Medications   New Prescriptions/Refills:  NAPROSYN 500 MG TABS (NAPROXEN) 1 by mouth twice a day w/food for pain  #60 x 0, 09/04/2009, Daine Gravel MD    Today's Orders   [phone] - Ofc Vst, Est Level III [OVF-64332]    Patient Instructions   soft C collar rx given                ]

## 2010-02-16 LAB — COMPREHENSIVE METABOLIC PANEL
A/G Ratio: 1.5 (ref 1.1–2.5)
ALT: 15 units/L (ref 0–40)
AST: 19 units/L (ref 0–40)
Albumin: 4.4 g/dL (ref 3.5–5.5)
Alkaline Phosphatase: 105 units/L (ref 25–150)
BUN/Creatinine Ratio: 19 (ref 9–23)
BUN: 13 mg/dL (ref 6–24)
CO2: 22 mmol/L (ref 20–32)
Calcium: 9.7 mg/dL (ref 8.7–10.2)
Chloride: 105 mmol/L (ref 97–108)
Creatinine: 0.67 mg/dL (ref 0.57–1.00)
GFR MDRD Af Amer: 116 mL/min (ref >59)
GFR MDRD Non Af Amer: 101 mL/min (ref >59)
Glucose: 94 mg/dL (ref 65–99)
Potassium: 4.1 mmol/L (ref 3.5–5.2)
Sodium: 142 mmol/L (ref 134–144)
Total Bilirubin: 0.3 mg/dL (ref 0.0–1.2)
Total Protein: 7.4 g/dL (ref 6.0–8.5)

## 2010-02-16 LAB — CBC
Hematocrit: 41.4 % (ref 34.0–44.0)
Hemoglobin: 13.5 g/dL (ref 11.5–15.0)
MCH: 29.8 pg (ref 27.0–34.0)
MCHC: 32.6 g/dL (ref 32.0–36.0)
MCV: 91 fL (ref 80–98)
Platelets: 284 10*3/uL (ref 140–415)
RBC: 4.53 10*6/uL (ref 3.80–5.10)
RDW: 13.1 % (ref 11.7–15.0)
WBC: 7.5 10*3/uL (ref 4.0–10.5)

## 2010-02-16 LAB — HEMOGLOBIN A1C: Hemoglobin A1C: 5.5 % (ref 4.8–5.6)

## 2010-02-16 LAB — URINALYSIS W/RFL TO MICROSCOPIC
Bilirubin Urine: NEGATIVE
Glucose, UA: NEGATIVE g/dL
Ketones, UA: NEGATIVE
Leukocytes, UA: NEGATIVE
Nitrite, UA: NEGATIVE
Protein, UA: NEGATIVE
Specific Gravity, UA: 1.026 (ref 1.005–1.030)
Urobilinogen, UA: 0.2 (ref 0.0–1.9)
pH, UA: 6 (ref 5.0–7.5)

## 2010-02-16 LAB — LIPID PANEL
Cholesterol, Total: 139 mg/dL (ref 100–199)
HDL: 55 mg/dL (ref >39)
LDL Cholesterol: 62 mg/dL (ref 0–99)
Triglycerides: 109 mg/dL (ref 0–149)

## 2010-02-16 LAB — TSH: TSH: 1.15 microintl units/mL (ref 0.450–4.500)

## 2010-02-16 NOTE — Unmapped (Signed)
Signed by Daine Gravel MD on 02/17/2010 at 16:34:30  Patient: Sarah Huang  Note: All result statuses are Final unless otherwise noted.    Tests: (1) TSH reflex to T4F [phone])    Order Note: Performed at:  [facility]    Order Note: [address], [place], [place]  [id number]    Order Note: Lab Director: Jacki Cones MD, Phone:  [phone]    TSH                       1.150 uIU/mL                0.450-4.500    Note: An exclamation mark (!) indicates a result that was not dispersed into   the flowsheet.  Document Creation Date: 02/17/2010 12:22 PM  _______________________________________________________________________    (1) Order result status: Final  Collection or observation date-time: 02/16/2010 09:42  Requested date-time: 02/16/2010 09:42  Receipt date-time: 02/16/2010 09:42  Reported date-time:   Referring Physician:    Ordering Physician:  Herschel Senegal)  Specimen Source:   Source: Leverne Humbles Order Number: [id number]  Lab site:

## 2010-02-16 NOTE — Unmapped (Signed)
Signed by Daine Gravel MD on 02/16/2010 at 10:04:01      Reason for Visit   Chief Complaint: Physical    History from: patient    Allergies  No Known Allergies    Medications   Medication list reviewed during this update. ASPIRIN 81 MG TABS (ASPIRIN) one by mouth daily  IMITREX 100 MG TABS (SUMATRIPTAN SUCCINATE) 1 by mouth daily as needed for migraine and may repeat in 2 hours if needed but no more than 2 in 24 hours period.  ZOCOR 40 MG TABS (SIMVASTATIN) 1 by mouth daily  PRILOSEC 20 MG CPDR (OMEPRAZOLE) 1 by mouth daily in am       Vital Signs:   Wt: 190 lbs.      BMI: 33.78  BSA: 1.89  Wt chg (lbs): 4  Pulse: 64 (regular)  BP: 102/70  Barriers to Care/Communication: None    Intake recorded by: Malleri Zoutis MA on February 16, 2010 9:00 AM      History of Present Illness: CPE  recently some mild dyspnea w/talking a lot or with strenous exercise  some depressn due to missing niece  would like a CXR due to hx of tob use and lots of second hand smoke  would like dexa scan  rest of ROS rev and neg  FASTING  GERD worse lately   PAST HISTORY  Past Medical History (reviewed - no changes required):  Hyperlipidemia, Kidney stones, dylipidemia, and depression.   Surgical History (reviewed - no changes required):  Tonsillectomy in the past.   Hysty/BSO--2004----ectopic pregnancy/prolapse, hx sinus surg x4    Family History (reviewed - no changes required): Mother--DM, HTN, CAD, COPD, high chol  Aunt--breast ca  Father--CAD, COPD, high chol  Siblings--CAD, COPD  Mother - HTN, DIABETES, TRIPLE BYPASS  Father - EMPHYSEMA, CHF  Sister - 2- COPD, 1- BLOOD DISORDER  Brother - 2- MI, COPD, 1- TRIPLE BYPASS, 1- HEART DEFECT, OPEN HEART SURGERY  Social History (reviewed - no changes required): Preferred Language: English,   Marital Status: divorced,   Children: 2,   Children's Names: 2 grandchildren  Employment Status: employed full-time,   Occupation: Engineer, civil (consulting)  Alcohol Use: none  3-5 green teas per day  Drug Use:  none  Tobacco Usage:prior smoker  Advice worker Quit: 1991,     Review of Systems  Refer to HPI for review of systems documentation.      Physical Examination:   BP: 102/  70    Physical Exam- Detail:   General Appearance: in obvious discomfort  Skin: mild tinea pedis  Eyes: Sclera white, conjunctiva without injection and pallor.  PERRLA.  EOMI  Ears: No lesions.  Tympanic membranes translucent, non-bulging.  Canal walls pink, without discharge.  Hearing grossly intact.  Oropharynx: clear, no adenop  Oral Cavity: Gums pink, good dentition.  Oral mucosa and tongue without lesions.  Respiratory: clear  Neck: no carotid bruits, no thyromegaly.   Lymphatic: Areas palpated not enlarged:  cervical, supraclavicular.  Breast: Breasts symmetrical.  No lumps, masses, discharge, tenderness or dimpling.  Cardiac: RRR no m/r/g, no edema  Vascular: no edema  Pedal Pulse Left: dorsalis pedis +2, posterior tib +2  Pedal Pulse Right: dorsalis pedis +2,  posterior tib +2  Abdomen: nondistended, nontender, normal BS, no organomegaly, no prominent aortic pulsation.   Female G/U: s/p hysty/bso  Neurologic: DTR/cereb intact  Psychiatric: normal affect           New Problems:  FAMILY HISTORY OF DIABETES MELLITUS (  ICD-V18.0)  FAMILY HISTORY OF DIABETES MELLITUS (ICD-V18.0)  SOB (ICD-786.05)  OSTEOPENIA (ICD-733.90)        Preventive Maintenance       Coordinating Care Providers   Cardiologist: Dr. Jacqlyn Larsen            Prescriptions:  PRILOSEC 20 MG CPDR (OMEPRAZOLE) 1 by mouth daily in am  #30 x 11   Entered and Authorized by: Daine Gravel MD   Signed by: Daine Gravel MD on 02/16/2010   Method used: Print then Give to Patient   RxID: [id number]  ZOCOR 40 MG TABS (SIMVASTATIN) 1 by mouth daily  #30 x 11   Entered and Authorized by: Daine Gravel MD   Signed by: Daine Gravel MD on 02/16/2010   Method used: Print then Give to Patient   RxID: [id number]  IMITREX 100 MG TABS (SUMATRIPTAN  SUCCINATE) 1 by mouth daily as needed for migraine and may repeat in 2 hours if needed but no more than 2 in 24 hours period.  #12 x 11   Entered and Authorized by: Daine Gravel MD   Signed by: Daine Gravel MD on 02/16/2010   Method used: Print then Give to Patient   RxID: [id number]      Assessment and Plan     Problems   Status of Existing Problems:  Assessed ROUTINE GENERAL MEDICAL EXAM@HEALTH  CARE FACL as comment only - counseleld re HM - Daine Gravel MD  Assessed SOB as comment only - check spirometry - Daine Gravel MD  New Problems:  Dx of FAMILY HISTORY OF DIABETES MELLITUS (ICD-V18.0)  Onset: 02/16/2010  Dx of FAMILY HISTORY OF DIABETES MELLITUS (ICD-V18.0)  Onset: 02/16/2010  Dx of SOB (ICD-786.05)  Onset: 02/16/2010  Dx of OSTEOPENIA (ICD-733.90)  Onset: 02/16/2010    Medications   New Prescriptions/Refills:  PRILOSEC 20 MG CPDR (OMEPRAZOLE) 1 by mouth daily in am  #30 x 11, 02/16/2010, Niranjana Timotheus Salm MD  ZOCOR 40 MG TABS (SIMVASTATIN) 1 by mouth daily  #30 x 11, 02/16/2010, Niranjana Dublin Grayer MD  IMITREX 100 MG TABS (SUMATRIPTAN SUCCINATE) 1 by mouth daily as needed for migraine and may repeat in 2 hours if needed but no more than 2 in 24 hours period.  #12 x 11, 02/16/2010, Daine Gravel MD    Today's Orders   Dexa Bone Densitometry [CPT-77080]  Spirometry [CPT-94010]  CMP   (METAPNL) (10231) [CPT-80053]  Lipid Profile   (FATS) (7600) [CPT-80061]  CBC without Diff   (CBC) (1759) [CPT-85027]  TSH + FT4 reflex    (TSHRFX) (01027)  [CPT-84439]  Hemoglobin  A1C   (GLYCO) (496)  [CPT-83036]  Mammogram, Screening Bilateral [CPT-76092]  Urinalysis   (UA) (7909) [CPT-81000]  99396 - Preventive, Est, 40-64 yr [CPT-99396]    Disposition:     in 1 year(s)                   Process Orders  Check Orders Results:      [facility]: ABN not required for this insurance  Tests Sent for requisitioning (February 16, 2010 10:03 AM):      02/16/2010:  [facility] -- CMP   (METAPNL) (10231) [CPT-80053] (signed)      02/16/2010: [facility] -- Lipid Profile   (FATS) (7600) [CPT-80061] (signed)      02/16/2010: [facility] -- CBC without Diff   (CBC) (1759) [CPT-85027] (signed)      02/16/2010: [facility] -- TSH + FT4 reflex    (  TSHRFX) [id number])  [UYQ-03474] (signed)      02/16/2010: [facility] -- Hemoglobin  A1C   (GLYCO) (496)  [CPT-83036] (signed)      02/16/2010: [facility] -- Urinalysis   (UA) (7909) [CPT-81000] (signed)    ]

## 2010-02-16 NOTE — Unmapped (Signed)
Signed by Daine Gravel MD on 02/17/2010 at 16:34:30  Patient: Sarah Huang  Note: All result statuses are Final unless otherwise noted.    Tests: (1) Hemoglobin A1c ([id number])    Order Note: Performed at:  [facility] - [facility]    Order Note: [address], [place], [place]  [id number]    Order Note: Lab Director: Jacki Cones MD, Phone:  [phone]    Hemoglobin A1c            5.5 %                       4.8-5.6               Increased risk for diabetes: 5.7 - 6.4               Diabetes: >6.4               Glycemic control for adults with diabetes: <7.0    Note: An exclamation mark (!) indicates a result that was not dispersed into   the flowsheet.  Document Creation Date: 02/17/2010 12:22 PM  _______________________________________________________________________    (1) Order result status: Final  Collection or observation date-time: 02/16/2010 09:42  Requested date-time: 02/16/2010 09:42  Receipt date-time: 02/16/2010 09:42  Reported date-time:   Referring Physician:    Ordering Physician:  Herschel Senegal)  Specimen Source:   Source: Leverne Humbles Order Number: [id number]  Lab site:

## 2010-02-16 NOTE — Unmapped (Signed)
Signed by Daine Gravel MD on 02/17/2010 at 16:34:30  Patient: Sarah Huang  Note: All result statuses are Final unless otherwise noted.    Tests: (1) Comp. Metabolic Panel (14) (322000)    Order Note: Performed at:  [facility] - [facility]    Order Note: [address], [place], [place]  [phone]    Order Note: Lab Director: Jacki Cones MD, Phone:  [phone]    Glucose, Serum            94 mg/dL                    40-34    BUN                       13 mg/dL                    7-42    Creatinine, Serum         0.67 mg/dL                  0.57-1.00    BUN/Creatinine Ratio      19                          9-23    Bilirubin, Total          0.3 mg/dL                   5.9-5.6    Sodium, Serum             142 mmol/L                  134-144    Potassium, Serum          4.1 mmol/L                  3.5-5.2    Chloride, Serum           105 mmol/L                  97-108   Carbon Dioxide, Total                              22 mmol/L                   20-32    Calcium, Serum            9.7 mg/dL                   3.8-75.6   Protein, Total, Serum                              7.4 g/dL                    4.3-3.2    Albumin, Serum            4.4 g/dL                    9.5-1.8  ! Globulin, Total           3.0 g/dL                    8.4-1.6    A/G Ratio  1.5                         1.1-2.5    AST (SGOT)                19 IU/L                     0-40    ALT (SGPT)                15 IU/L                     0-40   Alkaline Phosphatase, S                              105 IU/L                    25-150    eGFR If NonAfricn Am      101 mL/min/1.73             >59-    eGFR If Africn Am         116 mL/min/1.73             >59-      Note: A persistent eGFR <60 mL/min/1.73 m2 (3 months or more) may      indicate chronic kidney disease. An eGFR >59 mL/min/1.73 m2 with an      elevated urine protein also may indicate chronic kidney disease.      Calculated using CKD-EPI formula.    Note: An exclamation mark  (!) indicates a result that was not dispersed into   the flowsheet.  Document Creation Date: 02/17/2010 12:22 PM  _______________________________________________________________________    (1) Order result status: Final  Collection or observation date-time: 02/16/2010 09:42  Requested date-time: 02/16/2010 09:42  Receipt date-time: 02/16/2010 09:42  Reported date-time:   Referring Physician:    Ordering Physician:  Herschel Senegal)  Specimen Source:   Source: Leverne Humbles Order Number: [id number]  Lab site:

## 2010-02-16 NOTE — Unmapped (Signed)
Signed by Daine Gravel MD on 02/17/2010 at 16:34:30  Patient: Sarah Huang  Note: All result statuses are Final unless otherwise noted.    Tests: (1) Microscopic Examination ([id number])    Order Note: Performed at:  [facility] - [facility]    Order Note: [address] [place], [place]  [id number]    Order Note: Lab Director: Jacki Cones MD, Phone:  [phone]  ! WBC                       0-5 /hpf                    0-5  ! RBC                       0-3 /hpf                    0-3  ! Epithelial Cells (non renal)                         [A]  >10 /hpf                    0-10  ! Bacteria                  None seen                   Noneseen/Few-  ! Mucus Threads             Present                     NotEstab.-    Note: An exclamation mark (!) indicates a result that was not dispersed into   the flowsheet.  Document Creation Date: 02/17/2010 12:22 PM  _______________________________________________________________________    (1) Order result status: Final  Collection or observation date-time: 02/16/2010 09:42  Requested date-time: 02/16/2010 09:42  Receipt date-time: 02/16/2010 09:42  Reported date-time:   Referring Physician:    Ordering Physician:  Herschel Senegal)  Specimen Source:   Source: Leverne Humbles Order Number: [id number]  Lab site:

## 2010-02-16 NOTE — Unmapped (Signed)
Signed by Daine Gravel MD on 02/17/2010 at 16:34:30  Patient: Sarah Huang  Note: All result statuses are Final unless otherwise noted.    Tests: (1) CBC, Platelet; No Differential (161096)    Order Note: Performed at:  [facility]    Order Note: [address], [place], [place]  [phone]    Order Note: Lab Director: Jacki Cones MD, Phone:  [phone]    WBC                       7.5 x10E3/uL                4.0-10.5    RBC                       4.53 x10E6/uL               3.80-5.10    Hemoglobin                13.5 g/dL                   13.0-86.5    Hematocrit                41.4 %                      34.0-44.0    MCV                       91 fL                       80-98    MCH                       29.8 pg                     27.0-34.0    MCHC                      32.6 g/dL                   78.4-69.6    RDW                       13.1 %                      11.7-15.0    Platelets                 284 x10E3/uL                140-415    Note: An exclamation mark (!) indicates a result that was not dispersed into   the flowsheet.  Document Creation Date: 02/17/2010 12:22 PM  _______________________________________________________________________    (1) Order result status: Final  Collection or observation date-time: 02/16/2010 09:42  Requested date-time: 02/16/2010 09:42  Receipt date-time: 02/16/2010 09:42  Reported date-time:   Referring Physician:    Ordering Physician:  Herschel Senegal)  Specimen Source:   Source: Leverne Humbles Order Number: [id number]  Lab site:       -----------------    The following lab values were dispersed to the flowsheet  with no units conversion:      WBC, 7.5 X10E3/UL, (F)  expected units: 10*3/mm3  RBC, 4.53 X10E6/UL, (F)  expected units: 10*6/mm3    Platelets, 284 X10E3/UL, (F)  expected units: 10*3/mm3

## 2010-02-16 NOTE — Unmapped (Signed)
Signed by Daine Gravel MD on 02/17/2010 at 16:34:30  Patient: Sarah Huang  Note: All result statuses are Final unless otherwise noted.    Tests: (1) Lipid Panel [phone])    Order Note: Performed at:  [facility] - [facility]    Order Note: [address], [place], [place]  [id number]    Order Note: Lab Director: Jacki Cones MD, Phone:  [phone]    Cholesterol, Total        139 mg/dL                   102-725    Triglycerides             109 mg/dL                   3-664    HDL Cholesterol           55 mg/dL                    >40-      According to ATP-III Guidelines, HDL-C >59 mg/dL is considered a      negative risk factor for CHD.  ! VLDL Cholesterol Cal      22 mg/dL                    3-47    LDL Cholesterol Calc      62 mg/dL                    4-25    Note: An exclamation mark (!) indicates a result that was not dispersed into   the flowsheet.  Document Creation Date: 02/17/2010 12:22 PM  _______________________________________________________________________    (1) Order result status: Final  Collection or observation date-time: 02/16/2010 09:42  Requested date-time: 02/16/2010 09:42  Receipt date-time: 02/16/2010 09:42  Reported date-time:   Referring Physician:    Ordering Physician:  Herschel Senegal)  Specimen Source:   Source: Leverne Humbles Order Number: [id number]  Lab site:

## 2010-02-16 NOTE — Unmapped (Signed)
Signed by Daine Gravel MD on 02/17/2010 at 16:34:30  Patient: Sarah Huang  Note: All result statuses are Final unless otherwise noted.    Tests: (1) Urinalysis, Routine (161096)    Order Note: Performed at:  [facility] - [facility]    Order Note: [address] [place], [place]  [phone]    Order Note: Lab Director: Jacki Cones MD, Phone:  [phone]   Microscopic Examination                              See below:    Urine-Color               Yellow                      Yellow-    Appearance                Clear                       Clear-    Specific Gravity          1.026                       1.005-1.030    pH                        6.0                         5.0-7.5    Glucose                   Negative                    Negative-    Protein                   Negative                    Negative/Trace-    Occult Blood         [A]  2+                          Negative-    Bilirubin                 Negative                    Negative-    Urobilinogen,Semi-Qn      0.2 mg/dL                   1.3-0.8    Nitrite, Urine            Negative                    Negative-    Ketones                   Negative                    Negative-    WBC Esterase              Negative                    Negative-  Note: An exclamation mark (!) indicates a result that was not dispersed into   the flowsheet.  Document Creation Date: 02/17/2010 12:22 PM  _______________________________________________________________________    (1) Order result status: Final  Collection or observation date-time: 02/16/2010 09:42  Requested date-time: 02/16/2010 09:42  Receipt date-time: 02/16/2010 09:42  Reported date-time:   Referring Physician:    Ordering Physician:  Herschel Senegal)  Specimen Source:   Source: Leverne Humbles Order Number: [id number]  Lab site:       -----------------    The following non-numeric lab results were dispersed to  the flowsheet even though numeric results were expected:      Glucose,  Negative

## 2010-02-17 NOTE — Unmapped (Signed)
Signed by Daine Gravel MD on 02/17/2010 at 17:40:05              Daine Gravel, M.D.  [address]  [address]  [place], [place] [address]  Phone [phone]  Fax [phone]      February 17, 2010      Sarah Huang  [address]    [place], [place] [address]                                                                                           RE:  TEST RESULTS   (Sarah Huang--07/24/1955)         Dear [name]:      The following is an interpretation of your most recent tests.  Please take note of any instructions provided.   Electrolyte studies:  Normal, Blood sugar normal    Kidney function studies:  Normal    Liver function studies:  Normal    Lipid panel:  Normal          Triglyceride: 109   Cholesterol: 139   LDL: 62   HDL: 55      Thyroid studies:  Normal     T Uptake: 30        Total T4: 7.8      TSH: 1.150      Diabetic studies:  Normal          HgbA1c: 5.5      CBC results (Blood Counts):  Normal             Keep up the good work,        Daine Gravel MD

## 2010-02-23 ENCOUNTER — Inpatient Hospital Stay: Attending: Critical Care Medicine

## 2010-02-23 NOTE — Unmapped (Addendum)
Signed by Loreen Freud MD on 02/24/2010 at 20:31:40    [facility]  Pulmonary, Critical Care and Sleep Medicine      Pulmonary Function Test     Normal Test:   Flow volume loop is normal.      Spirometry:   Spirometry is normal.    Interpreting Physician: Barrington Ellison, M.D.    External Document

## 2010-02-25 NOTE — Unmapped (Signed)
Signed by Kerrin Mo MA on 02/25/2010 at 14:45:11    PHONE NOTE    Reason for Call: pl. let her know lung functn test is normal--good news, pl. let me know if increasing or persistent problems w/her breathing       Initial call taken by: Daine Gravel MD,  February 25, 2010 12:13 PM      FOLLOW UP  Pt informed.  Follow-up by:  Malleri Zoutis MA,  February 25, 2010 2:45 PM

## 2010-03-01 NOTE — Unmapped (Signed)
Signed by   LinkLogic on 03/01/2010 at 11:12:05    Appointment status changed to no show by  LinkLogic on 03/01/2010 11:12 AM.    No Show Comments  ----------------      Appointment Information  -----------------------  Appt Type:         Date:  Monday, March 01, 2010       Time:  10:30 AM for 30 min    Urgency:  Routine    Made By:  Jannett Celestine   To Visit:  ENDOCRINOLOGY DEXA SCAN MAB     Reason:      Appt Comments  -------------  -- 03/01/10 11:12: (122VDAVIS) NO SHOW --  REF /OSTEOPENIA\\DEXA SCAN\\Referred byGerhard Perches; DXSCN (MA6) DEXA SCAN    -- 02/25/10 9:08: (122CBRANDON) BOOKED --  Routine  at 03/01/2010 10:30 AM for 30 min  REF /OSTEOPENIA\\DEXA SCAN\\Referred by:

## 2010-03-02 NOTE — Unmapped (Signed)
Signed by Alanda Slim on 03/03/2010 at 16:51:22    Clinical Lists Changes    Problems:  Added new problem of POSTABLATIVE OVARIAN FAILURE (ICD-256.2)  Orders:  Added new Service order of [address] - DXA Scan; Axial skeleton ([id number]) - Signed

## 2010-03-03 ENCOUNTER — Inpatient Hospital Stay

## 2010-03-03 NOTE — Unmapped (Addendum)
Signed by Robbi Garter MD on 03/03/2010 at 00:00:00  DXA      Imported By: Betsey Amen 03/03/2010 21:20:00    _____________________________________________________________________    External Attachment:    Please see [employer] EMR for this document.  Signed by Darrall Dears on 04/06/2010 at 14:28:41    faxd to Dr.Parthasarathi on 03-03-10.

## 2010-03-03 NOTE — Unmapped (Signed)
Signed by Robbi Garter MD on 03/03/2010 at 00:00:00  Bone Health and Osteoporosis      Imported By: Betsey Amen 03/03/2010 21:19:12    _____________________________________________________________________    External Attachment:    Please see [employer] EMR for this document.

## 2010-03-24 NOTE — Unmapped (Signed)
Signed by Daine Gravel MD on 03/24/2010 at 13:27:07              Daine Gravel, M.D.  [address]  [address]  [place], [place] [address]  Phone [phone]  Fax [phone]      March 24, 2010      Sarah Huang  [address]    [place], [place] [address]                                                                                           RE:  TEST RESULTS   (Sarah Huang--06/26/1955)         Dear Ms. Cervantes:      The following is an interpretation of your most recent tests.  Please take note of any instructions provided.           Other lab results: The bone density test shows osteoporosis in the spine and just mild bone loss/osteopenia in the hip.  I'd like to see you at your earliest convenience to evaluate and discuss this further.       Sincerely,        Daine Gravel MD

## 2010-04-09 LAB — CALCITRIOL: Vit D, 1,25-Dihydroxy: 90 (ref 10.0–75.0)

## 2010-04-09 NOTE — Unmapped (Signed)
Signed by Caryn Bee MD on 04/12/2010 at 17:04:01  Patient: Sarah Huang  Note: All result statuses are Final unless otherwise noted.    Tests: (1) Calcitriol(1,25 di-[place] Vit D) [phone])    Order Note: Performed at:  [facility]    Order Note: [address] [place], [place]  [phone]    Order Note: Lab Director: Mila Homer MD, Phone:  [phone]   Calcitriol(1,25 di-[place] Vit D)                         [H]  90.0 pg/mL                  10.0-75.0    Note: An exclamation mark (!) indicates a result that was not dispersed into   the flowsheet.  Document Creation Date: 04/12/2010 4:31 PM  _______________________________________________________________________    (1) Order result status: Final  Collection or observation date-time: 04/09/2010 09:32  Requested date-time: 04/09/2010 09:32  Receipt date-time: 04/09/2010 09:32  Reported date-time:   Referring Physician:    Ordering Physician:  Herschel Senegal)  Specimen Source:   Source: Leverne Humbles Order Number: [id number]  Lab site:

## 2010-04-09 NOTE — Unmapped (Signed)
Signed by Daine Gravel MD on 04/09/2010 at 09:06:57    Printed Handout:  - Calcium Food Sources

## 2010-04-09 NOTE — Unmapped (Signed)
Signed by Daine Gravel MD on 04/09/2010 at 13:09:06      Reason for Visit   Chief Complaint: Follow up with dexa scan    History from: patient    Allergies  No Known Allergies    Medications   Medication list reviewed during this update. IMITREX 100 MG TABS (SUMATRIPTAN SUCCINATE) 1 by mouth daily as needed for migraine and may repeat in 2 hours if needed but no more than 2 in 24 hours period.  ZOCOR 40 MG TABS (SIMVASTATIN) 1 by mouth daily  PRILOSEC 20 MG CPDR (OMEPRAZOLE) 1 by mouth daily in am       Vital Signs:       Pulse: 72 (regular)  BP: 120/70    Intake recorded by: Malleri Zoutis MA on April 09, 2010 8:53 AM      History of Present Illness: osteoporosis f/up  due to hx of kidney stones not on calcium supplemts           Physical Examination:   BP: 120/  70    Physical Exam- Detail:   General Appearance: nad  Respiratory: clear  Cardiac: RRR no m/r/g, no edema           New Medications:  FOSAMAX 70 MG TABS (ALENDRONATE SODIUM) 1 by mouth weekly as directed  D 2000  TABS (CHOLECALCIFEROL TABS) 1 by mouth daily        Preventive Maintenance       Coordinating Care Providers   Cardiologist: Dr. Jacqlyn Larsen            Prescriptions:  D 2000  TABS (CHOLECALCIFEROL TABS) 1 by mouth daily  #30 x 5   Entered and Authorized by: Daine Gravel MD   Signed by: Daine Gravel MD on 04/09/2010   Method used: Print then Give to Patient   RxID: [id number]  FOSAMAX 70 MG TABS (ALENDRONATE SODIUM) 1 by mouth weekly as directed  #4 x 5   Entered and Authorized by: Daine Gravel MD   Signed by: Daine Gravel MD on 04/09/2010   Method used: Print then Give to Patient   RxID: [id number]      Assessment and Plan     Problems   Status of Existing Problems:  Assessed OSTEOPOROSIS as comment only - new dx, counselled at length, would like to avoid Ca supplemts but aim for 1500 mg calcium daily, as she has hx renal stones would prefer to avoid tabs if poss., also add vit D  and fosamax - Daine Gravel MD    Medications   New Prescriptions/Refills:  D 2000  TABS (CHOLECALCIFEROL TABS) 1 by mouth daily  #30 x 5, 04/09/2010, Daine Gravel MD  FOSAMAX 70 MG TABS (ALENDRONATE SODIUM) 1 by mouth weekly as directed  #4 x 5, 04/09/2010, Daine Gravel MD    Today's Orders   Vitamin D, 1, 25-Dihydroxy  ([id number]) (63875) [CPT-82652]  99213 - Ofc Vst, Est Level III [IEP-32951]    Patient Instructions   AIM FOR CALCIUM 1500 MG DAILY THROUGH THE DIET IF POSSIBLE--IF UNABLE TO GET ENOUGH IN TAKE CALCIUM SUPPLEMENT 500 MG WITH 16 OZ WATER    Disposition:    *Patient Care Summary printed and given to patient.     in 6 month(s)                   Process Orders  Check Orders Results:      EMR Link [facility]: ABN not required for this insurance  Tests Sent for requisitioning (April 09, 2010 1:08 PM):      04/09/2010: [facility] -- Vitamin D, 1, 25-Dihydroxy  ([id number]) ([address]) [CPT-82652] (signed)    ]

## 2010-04-19 NOTE — Unmapped (Signed)
Signed by Daine Gravel MD on 04/19/2010 at 17:12:44              Daine Gravel, M.D.  [address]  [address]  [place], [place] [address]  Phone [phone]  Fax [phone]      April 19, 2010      Sarah Huang  [address]    [place], [place] [address]                                                                                           RE:  TEST RESULTS   (Jennavie Harden--1955/04/24)         Dear Ms. Wrage:      The following is an interpretation of your most recent tests.  Please take note of any instructions provided.           Additional Comments: Vitamin D level is slightly higher than the normal range.  You can reduce your daily vitamin D dose to 1000 units daily.     Medications Prescribed or Changed  [employer] VITAMIN D  CAPS (CHOLECALCIFEROL CAPS) 1000 units daily--ANY BRAND      Medications Discontinued   D 2000  TABS (CHOLECALCIFEROL TABS) 1 by mouth daily        Sincerely,        Daine Gravel MD

## 2010-06-16 NOTE — Unmapped (Signed)
Signed by Kerrin Mo MA on 06/16/2010 at [facility]                   [facility]         [address], [address]         [place], [place] [address]         p [phone] f [phone]                     June 16, 2010        Sarah Huang  [address]   [place], [place] [address]          This notice is to remind you that you are due for your mammogram.  Please schedule at your earliest convenience.              Sincerely,      Daine Gravel, MD

## 2010-07-08 NOTE — Unmapped (Signed)
Signed by Kerrin Mo MA on 07/08/2010 at 15:27:04    Prescriptions:  IMITREX 100 MG TABS (SUMATRIPTAN SUCCINATE) 1 by mouth daily as needed for migraine and may repeat in 2 hours if needed but no more than 2 in 24 hours period.  #12 x 0   Entered by: Malleri Zoutis MA   Authorized by: Daine Gravel MD   Signed by: Malleri Zoutis MA on 07/08/2010   Method used: Telephoned to ...     [facility]     [address]     [place], [place]  [address]     Ph: [phone]     Fax: [phone]   RxID: [id number]

## 2010-10-08 ENCOUNTER — Ambulatory Visit: Admit: 2010-10-08 | Discharge: 2010-10-08 | Payer: PRIVATE HEALTH INSURANCE

## 2010-10-08 DIAGNOSIS — E669 Obesity, unspecified: Secondary | ICD-10-CM

## 2010-10-08 LAB — HEMOGLOBIN A1C: Hemoglobin A1C: 5.8 % (ref 4.8–5.6)

## 2010-10-08 LAB — GLUCOSE, RANDOM: Glucose: 86 mg/dL (ref 65–99)

## 2010-10-08 MED ORDER — omeprazole (PRILOSEC) 20 MG capsule
20 | ORAL_CAPSULE | ORAL | Status: AC
Start: 2010-10-08 — End: 2011-01-06

## 2010-10-08 MED ORDER — omeprazole (PRILOSEC) 20 MG capsule
20 | ORAL_CAPSULE | Freq: Every morning | ORAL | Status: AC
Start: 2010-10-08 — End: 2010-10-08

## 2010-10-08 MED ORDER — SUMAtriptan (IMITREX) 100 MG tablet
100 | ORAL_TABLET | Freq: Every day | ORAL | 1.00 refills | 23.00000 days | Status: AC | PRN
Start: 2010-10-08 — End: 2010-10-08

## 2010-10-08 MED ORDER — SUMAtriptan (IMITREX) 100 MG tablet
100 | ORAL_TABLET | Freq: Every day | ORAL | 1.00 refills | 23.00000 days | Status: AC | PRN
Start: 2010-10-08 — End: 2011-02-03

## 2010-10-08 MED ORDER — simvastatin (ZOCOR) 40 MG tablet
40 | ORAL_TABLET | Freq: Every day | ORAL | Status: AC
Start: 2010-10-08 — End: 2011-06-03

## 2010-10-08 MED ORDER — alendronate (FOSAMAX) 70 MG tablet
70 | ORAL_TABLET | ORAL | 1.00 refills | 90.00000 days | Status: AC
Start: 2010-10-08 — End: 2011-03-30

## 2010-10-08 MED ORDER — metFORMIN (GLUCOPHAGE) 500 MG tablet
500 | ORAL_TABLET | Freq: Two times a day (BID) | ORAL | Status: AC
Start: 2010-10-08 — End: 2011-01-06

## 2010-10-08 MED ORDER — cholecalciferol, vitamin D3, 1000 units tablet
1000 | ORAL_TABLET | Freq: Every day | ORAL | Status: AC
Start: 2010-10-08 — End: 2012-01-12

## 2010-10-08 MED ORDER — naproxen (NAPROSYN) 375 MG tablet
375 | ORAL_TABLET | Freq: Two times a day (BID) | ORAL | Status: AC
Start: 2010-10-08 — End: 2010-11-15

## 2010-10-08 NOTE — Unmapped (Addendum)
Start metformin one daily w/food in am and after a few wks once you're used to it take one twice daily  Please increase your fruits/veggies/whole grains/beans/lentils and reduce all other foods to help with weight loss  See me in 2 months  Please use the cpap regularly  Increase prilosec to twice daily

## 2010-10-08 NOTE — Unmapped (Signed)
She recvd a rx for Omeprazole with 2 diff direct -  One was for  30 daily, and the other was for 60 twice a day.  pls clarify which dose.

## 2010-10-08 NOTE — Unmapped (Signed)
Subjective  HPI:   Patient ID: Sarah Huang is a 55 y.o. female.    Chief Complaint:  HPI Comments: Some dyspnea lately with exercise   Low back and left hip pain esp overnight  Working on wt loss, an't exercise due to dyspnea  Very tired all the time and could nap few hrs in pm  Works 3 12 hr shifts  Very fatigued  Never feels rested  Dozes off a lot  Has CPAp and OSA and tried it for six mths with no help, told has just mild OSA      Hip Pain     Shortness of Breath                    ROS:   Review of Systems   Respiratory: Positive for shortness of breath.           Objective:   Physical Exam   Constitutional: No distress.        obese   Cardiovascular: Normal rate, regular rhythm and normal heart sounds.    Pulmonary/Chest: Effort normal and breath sounds normal. She has no wheezes. She has no rales.   Musculoskeletal: She exhibits no edema.        No spinal or paraspinal tend., pain mainly L troch bursa and L lumbar paraspinal muscles when does occur but no pain today per pt             Filed Vitals:    10/08/10 1256   BP: 118/80   Pulse: 72   Height: 5' 3 (1.6 m)   Weight: 200 lb (90.719 kg)     Body mass index is 35.43 kg/(m^2).  Body surface area is 2.01 meters squared.                Assessment/Plan:   Dyspnea--chronic--negative Echo/stress test/PFT, has OSA and advised to use CPAP reg as she's not doing so, suspect also component of obesity and deconditioning and advsied re wt loss and gradual conditioning  Back/hip pain--muscular/troch bursa pain--nsaid for pain, heat and exercise/wt loss    EKG procedure--normal--done as pt has dyspnea and reported concern about ?abnl EKG in the past  Patient Active Problem List   Diagnoses   ??? Esophageal reflux   ??? Other malaise and fatigue   ??? Elevated blood pressure reading without diagnosis of hypertension   ??? Routine general medical examination at a health care facility   ??? Calculus of kidney   ??? Other chronic nonalcoholic liver disease   ??? Plantar fascial  fibromatosis   ??? Insomnia, unspecified   ??? Disorder of bone and cartilage, unspecified   ??? Shortness of breath   ??? Family history of diabetes mellitus   ??? Osteoporosis, unspecified   ??? Postablative ovarian failure   ??? Other and unspecified hyperlipidemia

## 2010-10-08 NOTE — Unmapped (Signed)
Should be the bid, I increased after I spoke w/her.

## 2010-10-08 NOTE — Unmapped (Signed)
Kristin informed.

## 2010-11-15 MED ORDER — naproxen (NAPROSYN) 375 MG tablet
375 | ORAL_TABLET | ORAL | Status: AC
Start: 2010-11-15 — End: 2012-03-27

## 2010-12-17 MED ORDER — acyclovir (ZOVIRAX) 200 MG capsule
200 | ORAL_CAPSULE | ORAL | Status: AC
Start: 2010-12-17 — End: 2011-01-06

## 2010-12-17 NOTE — Unmapped (Signed)
Wants zovorax called in for cold sores,

## 2010-12-17 NOTE — Unmapped (Signed)
Ok, rx done, let her know

## 2010-12-20 NOTE — Unmapped (Signed)
Pharm calling [phone].Marland Kitchen  Pharm told pt rx zovorax tab called to pharm--pt requesting oitment.

## 2010-12-20 NOTE — Unmapped (Signed)
Pharmacy has been called and med form has been changed to ointment for 5 times daily and for 5 days. With 2 refills per dr. Deno Etienne

## 2010-12-20 NOTE — Unmapped (Signed)
Is this ok to change the form?

## 2011-01-06 ENCOUNTER — Ambulatory Visit: Admit: 2011-01-06 | Discharge: 2011-01-06 | Payer: PRIVATE HEALTH INSURANCE

## 2011-01-06 DIAGNOSIS — R350 Frequency of micturition: Secondary | ICD-10-CM

## 2011-01-06 LAB — URINE CULTURE

## 2011-01-06 MED ORDER — omeprazole (PRILOSEC) 40 MG capsule
40 | ORAL_CAPSULE | Freq: Every day | ORAL | Status: AC
Start: 2011-01-06 — End: 2012-01-12

## 2011-01-06 NOTE — Unmapped (Signed)
Subjective  HPI:   Patient ID: KAMILLE TOOMEY is a 55 y.o. female.    Chief Complaint:  HPI Comments: F/up HTN  Notes occ fleeting sharp pain chest maybe once wkly, few secs only, is very active and no anginal sxs w/exertion  Since on metformin notes few bouts of ?hematuria, has hx renal stones--pink tinge to urine  Some urin freq feelings, no pain, no current freq  NO RECTAL BLEEDING complaint  Wt Readings from Last 3 Encounters:  01/06/11 : 197 lb (89.359 kg)  10/08/10 : 200 lb (90.719 kg)  02/16/10 : 190 lb (86.183 kg)  BP Readings from Last 3 Encounters:  01/06/11 : 130/88  10/08/10 : 118/80  04/09/10 : 120/70                    ROS:   Review of Systems       Objective:   Physical Exam   Constitutional: No distress.   Cardiovascular: Normal rate, regular rhythm and normal heart sounds.    Pulmonary/Chest: Breath sounds normal.        No chest wall tend   Musculoskeletal: She exhibits no edema.             Filed Vitals:    01/06/11 1102   BP: 150/110   Pulse: 72   Height: 5' 3.75 (1.619 m)   Weight: 197 lb (89.359 kg)     Body mass index is 34.08 kg/(m^2).  Body surface area is 2.01 meters squared.                Assessment/Plan:   gerd she notes some increase, will incr prilosec  High bp fair control  Chest pain not cardiac, likely muscular or gerd, incr prilosec  Mild high gluc--no help w/metformin so will d/c it  Patient Active Problem List   Diagnoses   ??? Esophageal reflux   ??? Other malaise and fatigue   ??? Elevated blood pressure reading without diagnosis of hypertension   ??? Routine general medical examination at a health care facility   ??? Calculus of kidney   ??? Other chronic nonalcoholic liver disease   ??? Plantar fascial fibromatosis   ??? Insomnia, unspecified   ??? Disorder of bone and cartilage, unspecified   ??? Shortness of breath   ??? Family history of diabetes mellitus   ??? Osteoporosis, unspecified   ??? Postablative ovarian failure   ??? Other and unspecified hyperlipidemia

## 2011-01-06 NOTE — Unmapped (Signed)
Please increase prilosec to 40 mg daily  Please stop metformin

## 2011-01-07 LAB — POCT URINALYSIS DIPSTICK, NONAUTOMATED; W/MICRO
POCT - Blood, UA: NEGATIVE
POCT - Glucose, UA: NEGATIVE
POCT - Ketones, UA: NEGATIVE
POCT - Leukocytes Esterase, UA: NEGATIVE
POCT - Nitrite, UA: NEGATIVE
POCT - Specific Gravity, Urine: 1.01
POCT - Urobilinogen, UA: 0.2
POCT - pH, UA: 7

## 2011-01-28 NOTE — Unmapped (Signed)
Pt requesting rx z-pk.  Pt c/o body aches, sneezing, dry cough,h/a, no fever, and sinus pressure around face--2 days.       NKA

## 2011-01-28 NOTE — Unmapped (Signed)
Called and informed her sounds like mild case of flu as she got the vaccine--suggest rest/fluids and call ifnot im[roving few days or if worsening

## 2011-02-03 ENCOUNTER — Ambulatory Visit: Admit: 2011-02-03 | Discharge: 2011-02-03 | Payer: PRIVATE HEALTH INSURANCE

## 2011-02-03 DIAGNOSIS — J40 Bronchitis, not specified as acute or chronic: Secondary | ICD-10-CM

## 2011-02-03 MED ORDER — azithromycin (ZITHROMAX) 250 MG tablet
250 | ORAL_TABLET | ORAL | Status: AC
Start: 2011-02-03 — End: 2011-04-07

## 2011-02-03 MED ORDER — SUMAtriptan (IMITREX) 100 MG tablet
100 | ORAL_TABLET | ORAL | 1.00 refills | 23.00000 days | Status: AC
Start: 2011-02-03 — End: 2012-03-27

## 2011-02-03 MED ORDER — codeineguaifenesinROBITUSSINAC10100mg5mLsyrup
10-100 | ORAL | 0.00 refills | 15.50000 days | Status: AC | PRN
Start: 2011-02-03 — End: 2011-04-07

## 2011-02-03 NOTE — Unmapped (Signed)
Rest/fluids, call if not improving next 3-4 days

## 2011-02-03 NOTE — Unmapped (Signed)
Subjective  HPI:   Patient ID: Sarah Huang is a 56 y.o. female.    Chief Complaint:  HPI Comments: Cold sxs and now cough with thick white sputum thick, some chest pain w/cough and some fever at home past few d, getting worse past few d  chils    Cough  Associated symptoms include chest pain and a fever.   Flank Pain  Associated symptoms include chest pain and a fever.   Sore Throat   Associated symptoms include coughing.   Chest Pain   Associated symptoms include a cough and a fever.   Fever   Associated symptoms include chest pain and coughing.                   ROS:   Review of Systems   Constitutional: Positive for fever.   Respiratory: Positive for cough.    Cardiovascular: Positive for chest pain.   Genitourinary: Positive for flank pain.   All other systems reviewed and are negative.           Objective:   Physical Exam   Constitutional: No distress.   HENT:        No sinus tend   Cardiovascular: Normal rate, regular rhythm and normal heart sounds.    Pulmonary/Chest: Effort normal and breath sounds normal.             Filed Vitals:    02/03/11 1320   BP: 118/72   Pulse: 72   Temp: 98.4 ??F (36.9 ??C)   Height: 5' 4 (1.626 m)   Weight: 195 lb (88.451 kg)     Body mass index is 33.47 kg/(m^2).  Body surface area is 2.00 meters squared.                Assessment/Plan:   URI given worsening sxs suspect secondary bronchitis and will tx as such as she notes is steadily worsening  Patient Active Problem List   Diagnoses   ??? Esophageal reflux   ??? Other malaise and fatigue   ??? Elevated blood pressure reading without diagnosis of hypertension   ??? Routine general medical examination at a health care facility   ??? Calculus of kidney   ??? Plantar fascial fibromatosis   ??? Insomnia, unspecified   ??? Family history of diabetes mellitus   ??? Osteoporosis, unspecified   ??? Postablative ovarian failure   ??? Other and unspecified hyperlipidemia   ??? Fatty liver

## 2011-03-30 MED ORDER — alendronate (FOSAMAX) 70 MG tablet
70 | ORAL_TABLET | ORAL | 1.00 refills | 90.00000 days | Status: AC
Start: 2011-03-30 — End: 2011-09-13

## 2011-04-07 ENCOUNTER — Ambulatory Visit: Admit: 2011-04-07 | Discharge: 2011-04-07 | Payer: PRIVATE HEALTH INSURANCE

## 2011-04-07 DIAGNOSIS — Z0000 Encounter for general adult medical examination without abnormal findings: Secondary | ICD-10-CM

## 2011-04-07 LAB — POCT URINALYSIS DIPSTICK, NONAUTOMATED; W/O MICRO
POCT - ALT (SGPT): 1.03 (ref 0–55)
POCT - Bilirubin, UA: NEGATIVE
POCT - Glucose, UA: NEGATIVE
POCT - Ketones, UA: NEGATIVE
POCT - Leukocytes Esterase, UA: NEGATIVE
POCT - Nitrite, UA: NEGATIVE
POCT - Protein, UA: NEGATIVE
POCT - Urobilinogen, UA: NEGATIVE
POCT - pH, UA: 6

## 2011-04-07 NOTE — Unmapped (Signed)
TM clear  Refer to audiology

## 2011-04-07 NOTE — Unmapped (Signed)
Subjective  HPI:   Patient ID: Sarah Huang is a 56 y.o. female.    Medication Sig   ??? alendronate (FOSAMAX) 70 MG tablet TAKE 1 TABLET BY MOUTH WEEKLY   ??? naproxen (NAPROSYN) 375 MG tablet TAKE 1 TABLET BY MOUTH TWICE DAILY WITH MEALS   ??? omeprazole (PRILOSEC) 40 MG capsule Take 1 capsule (40 mg total) by mouth daily.   ??? simvastatin (ZOCOR) 40 MG tablet Take 1 tablet (40 mg total) by mouth daily.   ??? SUMAtriptan (IMITREX) 100 MG tablet TAKE 1 TABLET BY MOUTH DAILY AS NEEDED FOR MIGRAINE & MAY REPEAT IN 2 HOURS IF NEEDED IN 24 HOURS   ??? cholecalciferol, vitamin D3, 1000 units tablet Take 1 tablet (1,000 Units total) by mouth daily.       Chief Complaint:  HPI         56 year old for CPE    Hearing Loss    Ongoing for a long time  Bilateral   Ignoring this  Free consult with [facility]    Left Hip Pain    Points to left buttock  Radiates to lateral thigh  Wakes her up at night  Hurts to lay on it  Sitting can aggravate  Doesn't hurt with standing and walking    Weight Gain    Feels like she can't lose weight  Has never done calorie restriction  Has never calculated or tired to do this    Incomplete Voiding    Feels like she is not emptying completely  Seems tough to start  Starts and stops  Hx of Hyst  No dysuria  Hematuria since a kid - evidently has seen specialist for this    Stool Change    Ribbon like stool  Goes two - three times daily  Doesn't feel empty  Colonoscopy in 2010 - at [facility]    Lab Results   Component Value Date    HGBA1C 5.8* 10/08/2010            ROS:   Review of Systems   Constitutional: Negative for fever and unexpected weight change.   Respiratory: Negative for apnea, chest tightness and shortness of breath.    Cardiovascular: Negative for chest pain.   Gastrointestinal: Negative for abdominal distention.   Genitourinary: Positive for difficulty urinating. Negative for dysuria.   Musculoskeletal: Positive for arthralgias.       Past Medical History   Diagnosis Date   ??? Hyperlipidemia    ???  Kidney stones    ??? Depression    ??? Dyslipidemia      Past Surgical History   Procedure Date   ??? Tonsillectomy      in the past   ??? Hysterectomy 2004     Hysty/BSO 2004-etopic pregnancy/prolapse   ??? Salpingoophorectomy 2004     Hysty/BSO-etopic pregnancy/prolapse   ??? Sinus surgery      hx sinus surg x4     Family History   Problem Relation Age of Onset   ??? Diabetes Mother    ??? Hypertension Mother    ??? Coronary artery disease Mother    ??? Heart disease Mother      Triple Bypass   ??? COPD Mother    ??? Hyperlipidemia Mother    ??? Coronary artery disease Father    ??? Hyperlipidemia Father    ??? COPD Father    ??? Heart failure Father      CHF   ??? Emphysema Father    ???  Other Sister      Blood Disorder   ??? COPD Sister    ??? Heart attack Brother    ??? COPD Brother    ??? Heart disease Brother      1 brother -Triple Bypass   ??? Other Brother      Heart Defect, Open heart surgery   ??? Breast cancer Other    ??? COPD Other    ??? Breast cancer Other      History   Substance Use Topics   ??? Smoking status: Former Smoker   ??? Smokeless tobacco: Not on file    Comment: 02/23/2009, no passive smoke exposure 04/15/2009   ??? Alcohol Use: No      02/16/2010  3-5 green teas per day 04/15/2009          Objective:   Physical Exam   Constitutional: She appears well-developed and well-nourished.   HENT:   Head: Normocephalic and atraumatic.   Right Ear: External ear normal.   Left Ear: External ear normal.   Mouth/Throat: No oropharyngeal exudate.   Eyes: Conjunctivae are normal. No scleral icterus.   Neck: No JVD present. No thyromegaly present.   Cardiovascular: Normal rate.    No murmur heard.  Pulmonary/Chest: No respiratory distress.   Abdominal: She exhibits no distension and no mass. There is no tenderness. There is no rebound and no guarding.   Genitourinary: Rectum normal. Rectal exam shows no fissure and anal tone normal.   Musculoskeletal:        Tender greater trochanter   Lymphadenopathy:     She has no cervical adenopathy.             Filed Vitals:     04/07/11 1300   BP: 138/76   Pulse: 72   Height: 5' 4 (1.626 m)   Weight: 199 lb (90.266 kg)     Body mass index is 34.16 kg/(m^2).  Body surface area is 2.02 meters squared.                Assessment/Plan:     CPE    UTD colo and Mammo  DEXA done  Hyst    Hearing loss  TM clear  Refer to audiology    Osteoporosis, unspecified  On fosamax  Check D  Recheck DEXA in 2/14    Trochanteric bursitis of left hip  Exam consistent with this  PT at [facility]      Urinary retention with incomplete bladder emptying  Urinalysis today  Sounds like stricture of obstruction  Refer to [facility]      Hypercholesteremia  Check lipid profile.  Continue statin      Fatigue  TSH    Weight gain  Discussed food log and calorie restriction  [employer]

## 2011-04-07 NOTE — Unmapped (Signed)
Urinalysis today  Sounds like stricture of obstruction  Refer to [facility]

## 2011-04-07 NOTE — Unmapped (Signed)
Exam consistent with this  PT at [facility]

## 2011-04-07 NOTE — Unmapped (Signed)
Check lipid profile.  Continue statin

## 2011-04-07 NOTE — Unmapped (Signed)
TSH 

## 2011-04-07 NOTE — Unmapped (Signed)
Discussed food log and calorie restriction  [employer]

## 2011-04-07 NOTE — Unmapped (Signed)
On fosamax  Check D  Recheck DEXA in 2/14

## 2011-04-13 LAB — HEPATIC FUNCTION PANEL: Bilirubin, Direct: 0.08 mg/dL (ref 0.00–0.40)

## 2011-04-13 LAB — COMPREHENSIVE METABOLIC PANEL
A/G Ratio: 1.6 (ref 1.1–2.5)
ALT: 19 IU/L (ref 0–32)
AST: 18 IU/L (ref 0–40)
Albumin: 4.3 g/dL (ref 3.5–5.5)
Alkaline Phosphatase: 61 IU/L (ref 25–150)
BUN/Creatinine Ratio: 30 (ref 9–23)
BUN: 16 mg/dL (ref 6–24)
CO2: 23 mmol/L (ref 20–32)
Calcium: 9.3 mg/dL (ref 8.7–10.2)
Chloride: 105 mmol/L (ref 97–108)
Creatinine: 0.54 mg/dL (ref 0.57–1.00)
GFR MDRD Af Amer: 123 mL/min/{1.73_m2} (ref >59)
Globulin, Total: 2.7 g/dL (ref 1.5–4.5)
Glucose: 94 mg/dL (ref 65–99)
Potassium: 4.6 mmol/L (ref 3.5–5.2)
Sodium: 141 mmol/L (ref 134–144)
Total Bilirubin: 0.3 mg/dL (ref 0.0–1.2)
Total Protein: 7 g/dL (ref 6.0–8.5)
eGFR Non-Afr. American: 107 mL/min/{1.73_m2} (ref >59)

## 2011-04-13 LAB — VITAMIN D 25 HYDROXY: Vit D, 25-Hydroxy: 21.1 ng/mL (ref 30.0–100.0)

## 2011-04-13 LAB — LIPID PANEL
Cholesterol, Total: 149 mg/dL (ref 100–199)
HDL: 51 mg/dL (ref >39)
LDL Calculated: 75 mg/dL (ref 0–99)
Triglycerides: 113 mg/dL (ref 0–149)
VLDL Cholesterol Cal: 23 mg/dL (ref 5–40)

## 2011-04-13 LAB — TSH: TSH: 1.31 u[IU]/mL (ref 0.450–4.500)

## 2011-04-21 ENCOUNTER — Ambulatory Visit: Admit: 2011-04-21 | Discharge: 2011-04-21 | Payer: PRIVATE HEALTH INSURANCE | Attending: Audiologist

## 2011-04-21 ENCOUNTER — Ambulatory Visit: Admit: 2011-04-21 | Discharge: 2011-04-21 | Payer: PRIVATE HEALTH INSURANCE

## 2011-04-21 DIAGNOSIS — H903 Sensorineural hearing loss, bilateral: Secondary | ICD-10-CM

## 2011-04-21 DIAGNOSIS — N398 Other specified disorders of urinary system: Secondary | ICD-10-CM

## 2011-04-21 LAB — URINALYSIS W/RFL TO MICROSCOPIC
Bilirubin, UA: NEGATIVE
Glucose, UA: NEGATIVE mg/dL
Ketones, UA: NEGATIVE mg/dL
Leukocytes, UA: NEGATIVE
Nitrite, UA: NEGATIVE
Protein, UA: NEGATIVE mg/dL
RBC, UA: 6 /HPF (ref 0–3)
Specific Gravity, UA: 1.023 (ref 1.005–1.035)
Squam Epithel, UA: 4 /HPF (ref 0–5)
Urobilinogen, UA: <2 mg/dL (ref 0.2–1.9)
WBC, UA: <1 /HPF (ref 0–5)
pH, UA: 6 (ref 5.0–8.0)

## 2011-04-21 LAB — URINE CULTURE: Culture Result: <10000

## 2011-04-21 NOTE — Unmapped (Signed)
CHIEF COMPLAINT: No chief complaint on file.      Sarah Huang is a 56 y.o., G2P2002, referred by Dr Betsey Holiday for a consultation regarding microscopic hematuria, incomplete emptying, post void dribbling and recurrent of SUI ( pt had a sling placed at time of TVH in 2002 by GYN in [place]).  Pt denies gross hematuria, feeling of incomplete emptying has been going on for at least a year.  Pt with hx of nephrolithiasis s/p lithotripsy (last one 2002-2003).    - Urinary Incontinence: Yes   Condition started 1 years ago  Progression of the incontinence condition - worsening gradually   Number of Pads used daily - 0  Number of Daytime Voids - 4-5  Number of daily Nocturic Episodes - 3-4   Total number of voids per day -  5   Average number of Leaks per day  -  4-5x- pt describes as post void dribbling   Urinary Urgency - Yes   Urinary Frequency - No   Leakage suggestive of SUI Yes  with cough, sneezing, laughing, walking  Prior incontinence treatment - Yes  -MUS 2002  Prior pelvic rehab/improvement noted - No   Nocturnal Enuresis - No   Subjective Voiding Dysfunction - Yes   Feeling of Incomplete Emptying - Yes    Dysuria - No   Gross Hematuria - No   Recurrent UTI (with number in the past year) - No    History of diabetes - No    History of neurological disease - No      - Prolapse Symptoms - No     - Defecatory Symptoms - No     - Fecal Incontinence - No     - Negative Impact on Sexually Activity - No       Gyn History:   G2P2  Vaginal deliveries - 2  C-Sections -  0  Last pap - 2003, WNL.  Pt has hysterectomy in 2002 2nd to prolapse   Menopause - yes  Estrogen Supplementation - no  Personal or First degree history of endometrial/breast carcinoma - + paternal Aunt (dx at age of 57, lived until 59)    Past Medical History   Diagnosis Date   ??? Hyperlipidemia    ??? Kidney stones    ??? Depression    ??? Dyslipidemia        Past Surgical History   Procedure Date   ??? Tonsillectomy      in the past   ??? Hysterectomy 2004    Hysty/BSO 2004-etopic pregnancy/prolapse   ??? Salpingoophorectomy 2004     Hysty/BSO-etopic pregnancy/prolapse   ??? Sinus surgery      hx sinus surg x4        Family History   Problem Relation Age of Onset   ??? Diabetes Mother    ??? Hypertension Mother    ??? Coronary artery disease Mother    ??? Heart disease Mother      Triple Bypass   ??? COPD Mother    ??? Hyperlipidemia Mother    ??? Coronary artery disease Father    ??? Hyperlipidemia Father    ??? COPD Father    ??? Heart failure Father      CHF   ??? Emphysema Father    ??? Other Sister      Blood Disorder   ??? COPD Sister    ??? Heart attack Brother    ??? COPD Brother    ??? Heart disease Brother  1 brother -Triple Bypass   ??? Other Brother      Heart Defect, Open heart surgery   ??? Breast cancer Other    ??? COPD Other    ??? Breast cancer Other         History   Substance Use Topics   ??? Smoking status: Former Smoker   ??? Smokeless tobacco: Not on file    Comment: 02/23/2009, no passive smoke exposure 04/15/2009   ??? Alcohol Use: No      02/16/2010  3-5 green teas per day 04/15/2009      ALLERGIES:  No known allergies    Current Outpatient Rx   Name Route Sig Dispense Refill   ??? ALENDRONATE 70 MG TABLET  TAKE 1 TABLET BY MOUTH WEEKLY 4 tablet 4   ??? CHOLECALCIFEROL (VITAMIN D3) 1,000 UNIT TABLET Oral Take 1 tablet (1,000 Units total) by mouth daily. 30 tablet 11   ??? NAPROXEN 375 MG TABLET  TAKE 1 TABLET BY MOUTH TWICE DAILY WITH MEALS 60 tablet 0   ??? OMEPRAZOLE 40 MG CAPSULE,DELAYED RELEASE Oral Take 1 capsule (40 mg total) by mouth daily. 30 capsule 11   ??? SIMVASTATIN 40 MG TABLET Oral Take 1 tablet (40 mg total) by mouth daily. 30 tablet 5   ??? SUMATRIPTAN 100 MG TABLET  TAKE 1 TABLET BY MOUTH DAILY AS NEEDED FOR MIGRAINE & MAY REPEAT IN 2 HOURS IF NEEDED IN 24 HOURS 12 tablet 2        REVIEW OF SYSTEMS  General ROS: negative  Psychological ROS: negative   Hematological and Lymphatic ROS: negative  Endocrine ROS: negative  Cardiovascular ROS: No chest pain or dyspnea on mild exertion    Gastrointestinal ROS: No abdominal pain or recent bloody/black stools   Genito-Urinary ROS: no dysuria, trouble voiding, or hematuria  Musculoskeletal ROS: negative  Neurological ROS: No TIA or Stroke issues no TIA or stroke symptoms  Dermatological ROS: no rashes or recent focal skin color changes     - OBJECTIVE:  BP 134/84   Pulse 68   Resp 14   Ht 5' 3 (1.6 m)   Wt 200 lb (90.719 kg)   BMI 35.43 kg/m2  Constitutional: alert, no acute distress, well hydrated, well developed, well nourished  Skin: normal color, no rashes, no unusual bruising, warm to touch  Head: atraumatic, normocephalic  ENT: external ears normal, hearing grossly normal, external nose normal  Neck: supple, no scars  Chest: no gross deformities, no apparent respiratory distress  Spine: normal mobility noted  Extremities: no gross deformities, normal joint mobility  Neuro: oriented to time, place and person; affect and mood appropriate; normal interaction  Peripheral circulation: no cyanosis, no notable edema  Abdomen: nondistended, nontender, no masses,   Pelvic:    Ext. Genitalia: No visible lesions noted   Vagina:   ??  Anterior wall - stage II  ??  Post wall - stage I  ??  Apex - WNL  ??       Cervix - absent  ??  Mucosa - Atrophic  ??  Urethra - WNL  ??       Leakage with Valsalva (either supine/standing) - No , done both supine and standing  ??       Bimanual &/or Rectovaginal - No masses possibly indicating abnormalities felt on exam      POP-Q:  Aa: -1   Ba: -1 C: -8   GH: +2 PB: +2 TVL: +10   Ap: -2  Bp: -2  D: not present              Urinalysis = neg Leukocytes, neg Nitrates, neg RBCs  PVR (ml) = 10cc    - IMPRESSION:   Sarah Huang is a 56 y.o. female with recurrent SUI (hx of MUS in 2002), voiding dysfucntion  Encounter Diagnoses   Name Primary?   ??? Voiding dysfunction Yes   ??? Microscopic hematuria    ??? SUI (stress urinary incontinence, female)    ??? Post-void dribbling        - PLAN:   ?? SUI-for UDS at [facility] office  ?? Voiding  dysfunction/incomplete emptying-PVR today 10cc  ?? Microscopic hematuria-ua, microscopy, urine cytology, cystoscopy at [facility] office  ?? Post-void dribbling-no suburethral mass present, will address the above issues first, possible MRI in the future to r/o urethral diverticulum    - FOLLOW-UP   2 wks    ______________________________________________________________________________________      - Encounter Diagnosis and associated Orders:  1. Voiding dysfunction  Urinalysis, Urine culture   2. Microscopic hematuria  Urine Cytology (Prof), CT Abdomen and Pelvis With IV contrast   3. SUI (stress urinary incontinence, female)     4. Post-void dribbling  Urinalysis, Urine culture

## 2011-04-21 NOTE — Unmapped (Signed)
I saw and examined the patient.  I discussed with the resident or fellow and agree with findings and plan as documented in the note.    Jake Church, MD       We performed an in-and-out catheterization with an 62 French straight cathether to check a post-void residual to rule out voiding dysfunction given her symptoms.  The postvoid residual was normal.     1. Voiding dysfunction  Urinalysis, Urine culture   2. Microscopic hematuria  Urine Cytology (Prof), CT Abdomen and Pelvis With IV contrast, Urinalysis with microscopic   3. SUI (stress urinary incontinence, female)     4. Post-void dribbling  Urinalysis, Urine culture        Will have her follow-up at my office on [place] for urodynamics and cystoscopy.  We will formulate a final plan once we have this information.     Approximately 80 minutes was spent with this patient, over 45 minutes in face-to-face consultation.

## 2011-04-21 NOTE — Unmapped (Signed)
See scanned audiogram.

## 2011-04-21 NOTE — Unmapped (Signed)
HAE. Discussed HA options and realistic expectations.

## 2011-04-26 ENCOUNTER — Inpatient Hospital Stay: Admit: 2011-04-26 | Payer: PRIVATE HEALTH INSURANCE

## 2011-04-26 DIAGNOSIS — R3129 Other microscopic hematuria: Secondary | ICD-10-CM

## 2011-04-26 MED ORDER — OMNIPAQUE (iohexol) 350 mg iodine/mL 150 mL
350 | Freq: Once | INTRAVENOUS | Status: AC | PRN
Start: 2011-04-26 — End: 2011-04-26
  Administered 2011-04-26: 22:00:00 via INTRAVENOUS

## 2011-04-26 MED FILL — OMNIPAQUE 350 MG IODINE/ML INTRAVENOUS SOLUTION: 350 350 mg iodine/mL | INTRAVENOUS | Qty: 150

## 2011-04-26 NOTE — Unmapped (Signed)
Patient has been scheduled with Dr. Esperanza Sheets on 05/03/11 at 3:15pm.

## 2011-04-26 NOTE — Unmapped (Signed)
I called patient to let her know there were some abnormal cells noted in her urine cytology.  I would like her to see Dr. Esperanza Sheets.  [phone]   I put the referral in the computer.   She is getting a CT urogram today at 4 PM.  She will cancel her appointment with me for April 30th and reschedule after all is evaluated by Dr. Esperanza Sheets.    Millie--please cancel her appointment with me on the 30th.  Jill--can you please make sure she gets an appointment soon with dr. Esperanza Sheets?  Thanks All!

## 2011-04-26 NOTE — Unmapped (Signed)
We are doing it for microscopic hematuria so whatever they would recommend.  thanks

## 2011-04-26 NOTE — Unmapped (Signed)
Radiology called regarding the order for CT of the abdomen and pelvis with contrast. Patient has hx of kidney stones. Would like to know if Dr. Evlyn Kanner would still like the CT with contrast or should they do it w/o contrast first?

## 2011-05-03 ENCOUNTER — Ambulatory Visit: Admit: 2011-05-03 | Discharge: 2011-05-03 | Payer: PRIVATE HEALTH INSURANCE

## 2011-05-03 DIAGNOSIS — R319 Hematuria, unspecified: Secondary | ICD-10-CM

## 2011-05-03 NOTE — Unmapped (Signed)
Chief Complaint   Patient presents with   ??? Hematuria     New pt          History of Present Illness  HEMATURIA    Chief Complaint:  Gross Total Hematuria    Urine Color:  reddish  Previous Episodes:  No    Brief History:      Onset:  several weeks  Timing:  intermittent  Frequency:  monthly  Severity:   Pain:  No    Urologic History:  none    Associated Symptoms  Complains of: none  Denies:  urgency, frequency, weight loss, fever, chills, renal colic, CVA pain, RLQ pain, LLQ pain and dysuria    Prior Urologic Surgery:      Additional Comments:           Review of Systems   Constitutional: Negative for fever, chills, diaphoresis, activity change, appetite change, fatigue and unexpected weight change.   HENT: Negative for nosebleeds, sore throat, facial swelling, neck pain and voice change.    Eyes: Negative for pain, discharge, redness and itching.   Respiratory: Negative for apnea, cough, choking, chest tightness, shortness of breath, wheezing and stridor.    Cardiovascular: Negative for chest pain, palpitations and leg swelling.   Gastrointestinal: Negative for nausea, vomiting, abdominal pain, diarrhea, constipation, blood in stool, abdominal distention, anal bleeding and rectal pain.   Genitourinary: Positive for hematuria.   Musculoskeletal: Negative for back pain, joint swelling, arthralgias and gait problem.   Skin: Negative for color change, rash and wound.   Neurological: Negative for dizziness, seizures, syncope, facial asymmetry, speech difficulty and weakness.   Hematological: Negative for adenopathy. Does not bruise/bleed easily.   Psychiatric/Behavioral: Negative for suicidal ideas, behavioral problems, confusion and agitation.       Allergies  No known allergies    Medications  Outpatient Encounter Prescriptions as of 05/03/2011   Medication Sig Dispense Refill   ??? alendronate (FOSAMAX) 70 MG tablet TAKE 1 TABLET BY MOUTH WEEKLY  4 tablet  4   ??? cholecalciferol, vitamin D3, 1000 units tablet Take 1  tablet (1,000 Units total) by mouth daily.  30 tablet  11   ??? naproxen (NAPROSYN) 375 MG tablet TAKE 1 TABLET BY MOUTH TWICE DAILY WITH MEALS  60 tablet  0   ??? omeprazole (PRILOSEC) 40 MG capsule Take 1 capsule (40 mg total) by mouth daily.  30 capsule  11   ??? simvastatin (ZOCOR) 40 MG tablet Take 1 tablet (40 mg total) by mouth daily.  30 tablet  5   ??? SUMAtriptan (IMITREX) 100 MG tablet TAKE 1 TABLET BY MOUTH DAILY AS NEEDED FOR MIGRAINE & MAY REPEAT IN 2 HOURS IF NEEDED IN 24 HOURS  12 tablet  2        Histories  She has a past medical history of Hyperlipidemia; Kidney stones; Depression; and Dyslipidemia.    She has past surgical history that includes Tonsillectomy; Salpingoophorectomy (2004); Sinus surgery; Hysterectomy (2004); and mid urethral sling  (2002).    Her family history includes Breast cancer in her others; COPD in her brother, father, mother, other, and sister; Coronary artery disease in her father and mother; Diabetes in her mother; Emphysema in her father; Heart attack in her brother; Heart disease in her brother and mother; Heart failure in her father; Hyperlipidemia in her father and mother; Hypertension in her mother; and Other in her brother and sister.    She reports that she has quit smoking. She does not  have any smokeless tobacco history on file. She reports that she does not drink alcohol or use illicit drugs.    The following portions of the patient's history were reviewed and updated as appropriate: allergies, current medications, past family history, past medical history, past social history, past surgical history and problem list.    Resp. rate 16, height 5' 3 (1.6 m), weight 200 lb (90.719 kg).  Physical Exam   Constitutional: She is oriented to person, place, and time. She appears well-developed and well-nourished. She is active.  Non-toxic appearance. She does not have a sickly appearance. She does not appear ill.   HENT:   Head: Normocephalic and atraumatic.   Right Ear: External  ear normal.   Left Ear: External ear normal.   Nose: Nose normal.   Mouth/Throat: Oropharynx is clear and moist. No oropharyngeal exudate.   Eyes: Conjunctivae are normal. Right eye exhibits no discharge. Left eye exhibits no discharge. No scleral icterus.   Neck: Normal range of motion. No JVD present. No tracheal deviation present. No thyromegaly present.   Cardiovascular: Normal rate and regular rhythm.    Pulmonary/Chest: Effort normal. No stridor. No respiratory distress. She has no wheezes. She has no rales. She exhibits no tenderness.   Abdominal: Soft. She exhibits no distension and no mass. There is no tenderness. There is no rebound and no guarding. Hernia confirmed negative in the right inguinal area and confirmed negative in the left inguinal area.   Genitourinary: Vagina normal. No labial fusion. There is no rash, tenderness, lesion or injury on the right labia. There is no rash, tenderness, lesion or injury on the left labia. No erythema, tenderness or bleeding around the vagina. No foreign body around the vagina. No signs of injury around the vagina. No vaginal discharge found.   Musculoskeletal: Normal range of motion. She exhibits no edema and no tenderness.   Lymphadenopathy:     She has no cervical adenopathy.        Right: No inguinal adenopathy present.        Left: No inguinal adenopathy present.   Neurological: She is alert and oriented to person, place, and time. No cranial nerve deficit. Coordination normal.   Skin: Skin is warm and dry. No rash noted. She is not diaphoretic. No erythema. No pallor.   Psychiatric: She has a normal mood and affect. Her behavior is normal. Judgment and thought content normal.            Assessment  Neither her ct nor cystoscopy show a problem in her urinary tract that is responsible for her gross hematuria    Plan  See prn       Medical Decision Making  The following items were considered in medical decision making:  Discussion of test results with performing  provider  Obtain records and history from outside facility/provider  Review / order clinical lab tests  Review / order radiology tests  Review / order other diagnostic tests/interventions

## 2011-05-05 ENCOUNTER — Ambulatory Visit: Admit: 2011-05-05 | Discharge: 2011-05-05 | Payer: PRIVATE HEALTH INSURANCE

## 2011-05-05 DIAGNOSIS — M76899 Other specified enthesopathies of unspecified lower limb, excluding foot: Secondary | ICD-10-CM

## 2011-05-05 NOTE — Unmapped (Signed)
Physical Therapy Evaluation Form     Date of Evaluation: 05/05/2011  Referring MD: Gerhard Perches*  Primary Dx: (L) trochanteric bursitis  Onset Date: 9 months  PMH: See Medical History.  Medication Reviewed: See Medication List.           Subjective Information    Chief Complaint:   hurts worse at night, lying down and sitting long periods.  Throbs and hurts lateral hip, sometimes will go a little bit deeper in the back.  Right side may be starting a little bit    Mechanism of Injury: none    Diagnostic Tests/Results: none    Precautions:     Previous Treatments: chiropractor as recently as November, temporary relief.      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[employer], RN    PLOF:     Current LOF:     Patient Goal for Therapy:  Relieve the pain    Comments:         Objective Measures   Lumbar clearing:  Functional, limited by hip pain laterally (L)    Palpation:   Tender ITB proximally, gluteus medius, on (L)  Joint Mobility: WFL    Sensation: intact to light touch    Edema:  None evident    ROM / Strength:     Strength  ROM      Left  (__/5) Right  (__/5) Left  (deg) Right  (deg)   Hip         Flex 5/5  5/5 100 100    Ext 4/5 4/5 0 10    Abd 4/5 5/5      Add        IR        ER       Knee Ext 4/5* 5/5 0 0    Flex 5/5 5/5 120 120   Ankle DF 5/5 5/5 5* 5    PF        Inv        Ev             Special Tests:  Hip SIJ Knee Ankle   Thomas: Std Flex: ACL: Ant Draw:   Claiborne Rigg:  + (L) Stork:   Pain at gr troch PCL: Post Draw:   Hip Scour: Seated Flex: MCL: Inv Test:   FABER: Squish/Shear: LCLJanee Morn:   FADIR: Innom rot: P-F Grind: Homan's:    Sacral tor: Tilt test:     Spring:                     Gait: symmetric and functional        PT DAILY TREATMENT NOTE  Subjective:  (L) lateral hip pain, worse at night when trying to  sleep or sit still.  Achiness is awful.  Better when up and around all day.  Hx of plantar fascitis same side treated by chiropractor  Pain:   5/10  Objective:  EVALUATION COMPLETED TODAY. SEE EVAL DOCS FOR DETAILS    Exercise Log:  VISIT 1 2 3 4    DATE 05/06/11             gastroc stretch 30 H x 2      Hamstring stretch  30 H x 2      Arm raise  (R) 15 H x 4      Trunk twist  (L)15 H x 4                           Heat/ ice declined        Assessment:  Tight musculature throughout (L) hemibody with good response to stretching directly and indirectly.  Plan:  Continue 1-2x/ week for 6-8 visits to resolve muscular imbalances

## 2011-05-18 ENCOUNTER — Encounter: Admit: 2011-05-18 | Discharge: 2011-05-18 | Payer: PRIVATE HEALTH INSURANCE

## 2011-05-18 DIAGNOSIS — M76899 Other specified enthesopathies of unspecified lower limb, excluding foot: Secondary | ICD-10-CM

## 2011-05-18 NOTE — Unmapped (Signed)
PT DAILY TREATMENT NOTE  Subjective:  Some better, waking less at night.  Still so stiff in LE's.  Just finished 3 12 hr shifts.  Not much chance to do HEP on those days.  Some calf stretches during shifts  Pain:   2-3/10 with sitting  Objective:      Exercise Log:  VISIT 1 2 3 4    DATE 05/06/11 05/18/11            gastroc stretch 30 H x 2 30 H x 2     Hamstring stretch  30 H x 2 30 H x 2     Arm raise  (R) 15 H x 4 (R) 15H x 3     Trunk twist  (L)15 H x 4 (L)(15 H x 3)x2     SLR xxx xxx     Sit to stand Xxx (R) (2x 10) x 2     piriformis stretch xxx 30 H x 2     Fig 4 stretch xxx 30 H x 2       Assessment: Doing well with ther ex, overall. Tight musculature throughout (L) hemibody with good response to stretching directly and indirectly.  Plan:  Continue 1-2x/ week for 6-8 visits to resolve muscular imbalances

## 2011-05-27 ENCOUNTER — Encounter: Admit: 2011-05-27 | Discharge: 2011-05-27 | Payer: PRIVATE HEALTH INSURANCE

## 2011-05-27 DIAGNOSIS — M7062 Trochanteric bursitis, left hip: Secondary | ICD-10-CM

## 2011-05-27 NOTE — Unmapped (Signed)
PT DAILY TREATMENT NOTE  Subjective:  In a lot of pain.  Did 5 12 hour shifts, now 3rd day off and in a lot of pain, spreading into her back on the left.  So tight and painful  Pain:   5/10 with sitting today, 8/10 last night trying to sleep  Objective:      Exercise Log:  VISIT 1 2 3 4    DATE 05/06/11 05/18/11 05/27/11           gastroc stretch 30 H x 2 30 H x 2     Hamstring stretch  30 H x 2 30 H x 2     Arm raise  (R) 15 H x 4 (R) 15H x 3     Trunk twist  (L)15 H x 4 (L)(15 H x 3)x2     SLR xxx xxx     Sit to stand Xxx (R) (2x 10) x 2     piriformis stretch xxx 30 H x 2 30 H x 1    Fig 4 stretch xxx 30 H x 2 30 H x 1    Manual piriformis/ glut inhibition xxx xxx 15 min    Tennis ball at wall for hip inhibition xxx xxx 2 min    Bolster for ITB/ glute inhibition xxx xxx 5 min      Assessment: Patient reports significant improvement and relief that pain appears to be muscular.  She reports planning to go immediately to get a bolster for self care.  Also discussed trial of odor eater type insole in 1 shoe only to see if minor adjustment of leg length decreases tightness (L)  Plan:  Continue 1-2x/ week for 6-8 visits to resolve muscular imbalances

## 2011-06-03 MED ORDER — simvastatin (ZOCOR) 40 MG tablet
40 | ORAL_TABLET | ORAL | Status: AC
Start: 2011-06-03 — End: 2011-12-12

## 2011-09-13 MED ORDER — alendronate (FOSAMAX) 70 MG tablet
70 | ORAL_TABLET | ORAL | 1.00 refills | 90.00000 days | Status: AC
Start: 2011-09-13 — End: 2012-02-20

## 2011-09-14 NOTE — Unmapped (Signed)
Called rx to pharm for pt.  rx e-scribe failed

## 2011-12-12 MED ORDER — simvastatinZOCOR40MGtablet
40 | ORAL_TABLET | Freq: Every evening | ORAL | Status: AC
Start: 2011-12-12 — End: 2012-02-20

## 2012-01-12 MED ORDER — omeprazole (PRILOSEC) 40 MG capsule
40 | ORAL_CAPSULE | ORAL | Status: AC
Start: 2012-01-12 — End: 2012-02-20

## 2012-01-12 MED ORDER — cholecalciferol, vitamin D3, 1,000 unit capsule
25 | ORAL_CAPSULE | ORAL | Status: AC
Start: 2012-01-12 — End: 2012-02-20

## 2012-02-20 MED ORDER — omeprazole (PRILOSEC) 40 MG capsule
40 | ORAL_CAPSULE | Freq: Every day | ORAL | Status: AC
Start: 2012-02-20 — End: 2012-03-27

## 2012-02-20 MED ORDER — cholecalciferol, vitamin D3, 1,000 unit capsule
25 | ORAL_CAPSULE | Freq: Every day | ORAL | Status: AC
Start: 2012-02-20 — End: 2012-03-27

## 2012-02-20 MED ORDER — simvastatin (ZOCOR) 40 MG tablet
40 | ORAL_TABLET | Freq: Every evening | ORAL | Status: AC
Start: 2012-02-20 — End: 2012-03-28

## 2012-02-20 MED ORDER — alendronate (FOSAMAX) 70 MG tablet
70 | ORAL_TABLET | ORAL | 1.00 refills | 90.00000 days | Status: AC
Start: 2012-02-20 — End: 2012-03-27

## 2012-02-20 NOTE — Unmapped (Signed)
Rx e-scribed   I called and LM on voice mail to notify pt.

## 2012-02-20 NOTE — Unmapped (Signed)
She needs a rf on Alendronate , zocor,  prilosec, and VItamin D 3 . She states these meds were denied , however she says she is not due for her phys until March ( she set her appt up for 3/17 w. JK ) however she is out of meds and will need rf called in now.       Pharm is  [phone].

## 2012-02-20 NOTE — Unmapped (Signed)
Pt saw Dr. Betsey Holiday for phy 04-07-11.

## 2012-03-27 ENCOUNTER — Ambulatory Visit: Admit: 2012-03-27 | Discharge: 2012-03-27 | Payer: PRIVATE HEALTH INSURANCE

## 2012-03-27 ENCOUNTER — Inpatient Hospital Stay: Admit: 2012-03-27 | Payer: PRIVATE HEALTH INSURANCE

## 2012-03-27 DIAGNOSIS — E785 Hyperlipidemia, unspecified: Secondary | ICD-10-CM

## 2012-03-27 DIAGNOSIS — Z0000 Encounter for general adult medical examination without abnormal findings: Secondary | ICD-10-CM

## 2012-03-27 LAB — DIFFERENTIAL
Basophils Absolute: 24 /uL (ref 0–200)
Basophils Relative: 0.4 % (ref 0.0–1.0)
Eosinophils Absolute: 118 /uL (ref 15–500)
Eosinophils Relative: 2 % (ref 0.0–8.0)
Lymphocytes Absolute: 2301 /uL (ref 850–3900)
Lymphocytes Relative: 39 % (ref 15.0–45.0)
Monocytes Absolute: 407 /uL (ref 200–950)
Monocytes Relative: 6.9 % (ref 0.0–12.0)
Neutrophils Absolute: 3050 /uL (ref 1500–7800)
Neutrophils Relative: 51.7 % (ref 40.0–80.0)

## 2012-03-27 LAB — POCT URINALYSIS DIPSTICK, NONAUTOMATED; W/O MICRO
POCT - Ketones, UA: NEGATIVE
POCT - Leukocytes Esterase, UA: NEGATIVE
POCT - Nitrite, UA: NEGATIVE
POCT - Protein, UA: NEGATIVE
POCT - Specific Gravity, Urine: 1.03
POCT - pH, UA: 5

## 2012-03-27 LAB — CBC
Hematocrit: 40 % (ref 35.0–45.0)
Hemoglobin: 13.1 g/dL (ref 11.7–15.5)
MCH: 29.6 pg (ref 27.0–33.0)
MCHC: 32.9 g/dL (ref 32.0–36.0)
MCV: 90.2 fL (ref 80.0–100.0)
MPV: 7.8 fL (ref 7.5–11.5)
Platelets: 254 10*3/uL (ref 140–400)
RBC: 4.43 10*6/uL (ref 3.80–5.10)
RDW: 12.3 % (ref 11.0–15.0)
WBC: 5.9 10*3/uL (ref 3.8–10.8)

## 2012-03-27 LAB — COMPREHENSIVE METABOLIC PANEL, SERUM
ALT: 39 U/L (ref 13–69)
AST (SGOT): 21 U/L (ref 11–53)
Albumin: 4.2 g/dL (ref 3.6–5.1)
Alkaline Phosphatase: 68 U/L (ref 33–130)
Anion Gap: 8 mmol/L (ref 3–16)
BUN: 19 mg/dL (ref 7–25)
CO2: 28 mmol/L (ref 21–33)
Calcium: 9.2 mg/dL (ref 8.6–10.2)
Chloride: 105 mmol/L (ref 98–110)
Creatinine: 0.63 mg/dL (ref 0.50–1.20)
GFR MDRD Af Amer: 118 See note.
GFR MDRD Non Af Amer: 98 See note.
Glucose: 97 mg/dL (ref 65–99)
Osmolality, Calculated: 294 mOsm/kg (ref 278–305)
Potassium: 4.4 mmol/L (ref 3.5–5.3)
Sodium: 141 mmol/L (ref 135–146)
Total Bilirubin: 0.4 mg/dL (ref 0.2–1.2)
Total Protein: 7.3 g/dL (ref 6.2–8.3)

## 2012-03-27 LAB — TSH: TSH: 1.42 mIU/L (ref 0.45–4.50)

## 2012-03-27 LAB — LIPID PANEL
Cholesterol, Total: 210 mg/dL (ref 0–200)
HDL: 43 mg/dL (ref 30–60)
LDL Cholesterol: 134 mg/dL (ref 0–160)
Triglycerides: 165 mg/dL (ref 10–150)

## 2012-03-27 LAB — HEMOGLOBIN A1C: Hemoglobin A1C: 5.4 % (ref 4.8–6.4)

## 2012-03-27 LAB — VITAMIN D 25 HYDROXY: Vit D, 25-Hydroxy: 25.4 ng/mL (ref 30.0–100)

## 2012-03-27 MED ORDER — SUMAtriptan (IMITREX) 100 MG tablet
100 | ORAL_TABLET | ORAL | 1.00 refills | 23.00000 days | Status: AC
Start: 2012-03-27 — End: 2014-12-01

## 2012-03-27 MED ORDER — alendronate (FOSAMAX) 70 MG tablet
70 | ORAL_TABLET | ORAL | 1.00 refills | 90.00000 days | Status: AC
Start: 2012-03-27 — End: 2013-03-12

## 2012-03-27 MED ORDER — omeprazole (PRILOSEC) 40 MG capsule
40 | ORAL_CAPSULE | Freq: Every day | ORAL | Status: AC
Start: 2012-03-27 — End: 2012-07-11

## 2012-03-27 MED ORDER — cholecalciferol, vitamin D3, 1,000 unit capsule
25 | ORAL_CAPSULE | Freq: Every day | ORAL | Status: AC
Start: 2012-03-27 — End: 2012-04-03

## 2012-03-27 NOTE — Unmapped (Signed)
Subjective  HPI:   Patient ID: Sarah Huang is a 57 y.o. female.    Chief Complaint:  HPI     Here for PE. Chart reviewed in advance in preparation for this visit.  Thinks has kidney stone today, left flank pain started this AM.  Some urinary frequency, always has gross hematuria. Has been worked up by Dr. Esperanza Sheets.  Hx recurrent stones, fam hx stones.  Thinks tight muscles from simvastatin. Stopped two weeks ago.  Thinks improved slightly.  Has had a change in bowel habits in last two years. HAs had ribbon Like  stools. Has been persistent, also difficult to have BM.  Had nml colonoscopy 2010. Occ trace blood in stool.          Medications:  Current Outpatient Prescriptions   Medication Sig Dispense Refill   ??? alendronate (FOSAMAX) 70 MG tablet Take 1 tablet (70 mg total) by mouth every 7 days.  4 tablet  1   ??? cholecalciferol, vitamin D3, 1,000 unit capsule Take 1 capsule (1,000 Units total) by mouth daily.  30 capsule  1   ??? omeprazole (PRILOSEC) 40 MG capsule Take 1 capsule (40 mg total) by mouth daily.  30 capsule  1   ??? SUMAtriptan (IMITREX) 100 MG tablet TAKE 1 TABLET BY MOUTH DAILY AS NEEDED FOR MIGRAINE & MAY REPEAT IN 2 HOURS IF NEEDED IN 24 HOURS  12 tablet  2   ??? naproxen sodium (ANAPROX) 220 MG tablet Take 220 mg by mouth 2 times a day with meals.       ??? simvastatin (ZOCOR) 40 MG tablet Take 1 tablet (40 mg total) by mouth at bedtime.  30 tablet  1   ??? [DISCONTINUED] naproxen (NAPROSYN) 375 MG tablet TAKE 1 TABLET BY MOUTH TWICE DAILY WITH MEALS  60 tablet  0     No current facility-administered medications for this visit.        ROS:   Review of Systems   Constitutional: Positive for fatigue. Negative for fever, chills and unexpected weight change.   HENT: Positive for hearing loss. Negative for ear pain.    Eyes: Negative for visual disturbance (lasic surgery 2000).   Respiratory: Negative for cough and shortness of breath.    Cardiovascular: Negative for chest pain, palpitations and leg  swelling.   Gastrointestinal: Negative for nausea, vomiting, abdominal pain, diarrhea and constipation.        Change in caliber of stool.   Genitourinary: Positive for frequency, hematuria and flank pain. Negative for dysuria.        S/p hys   Musculoskeletal: Positive for myalgias and arthralgias.   Neurological: Positive for headaches (migraines, usually with allergies.).          Objective:   Physical Exam   Constitutional: She is oriented to person, place, and time. She appears well-developed and well-nourished. No distress.   HENT:   Head: Normocephalic and atraumatic.   Right Ear: Hearing, tympanic membrane, external ear and ear canal normal.   Left Ear: Hearing, tympanic membrane, external ear and ear canal normal.   Nose: Nose normal.   Mouth/Throat: Oropharynx is clear and moist and mucous membranes are normal. No oropharyngeal exudate.   Eyes: Conjunctivae and EOM are normal. Pupils are equal, round, and reactive to light. Right eye exhibits no discharge. Left eye exhibits no discharge. No scleral icterus.   Neck: Normal range of motion. Neck supple. No thyromegaly present.   Cardiovascular: Normal rate, regular rhythm, normal heart  sounds and intact distal pulses.  Exam reveals no gallop and no friction rub.    No murmur heard.  Pulmonary/Chest: Effort normal and breath sounds normal. She has no wheezes. She has no rales.   Abdominal: Bowel sounds are normal. She exhibits no distension and no mass. There is no hepatosplenomegaly. There is no tenderness. There is no rebound and no guarding.   Genitourinary:   Breasts without masses bilaterally.   Musculoskeletal: Normal range of motion. She exhibits no edema.   Right CVA tenderenss.   Lymphadenopathy:     She has no cervical adenopathy.   Neurological: She is alert and oriented to person, place, and time. She has normal reflexes.   Skin: Skin is warm and dry. No rash noted. She is not diaphoretic. No erythema.   Psychiatric: She has a normal mood and  affect. Her behavior is normal. Judgment and thought content normal.             Filed Vitals:    03/27/12 0821   BP: 103/88   Pulse: 64   Height: 5' 2.25 (1.581 m)   Weight: 201 lb (91.173 kg)     Body mass index is 36.48 kg/(m^2).  Body surface area is 2.00 meters squared.                Assessment/Plan:     Patient Active Problem List   Diagnosis   ??? Esophageal reflux- stable on PPI.   ??? Elevated blood pressure reading without diagnosis of hypertension- BP at goal today, on no meds.   ??? Calculus of kidney- symtpoms today consistent with renal stone. Pt will follow up with Dr.Bracken, had nml CT 4-13. Stong fm hx of stone.    ??? Plantar fascial fibromatosis   ??? Insomnia, unspecified   ??? Family history of diabetes mellitus   ??? Osteoporosis, unspecified- on fosamax. Check DEXA, Vit D level.   ??? Postablative ovarian failure   ??? Fatty liver   ??? Weight gain   ??? Hypercholesteremia- pt with  Myalgias she attributes to stain. She is off med now. Advised to stay off med. If symtpoms improved would try different med such as crestor to see if may be able to tolerate better.   ??? Urinary retention with incomplete bladder emptying   ??? Hearing loss   ??? Trochanteric bursitis of left hip   ??? SUI (stress urinary incontinence, female)   ??? Post-void dribbling   ??? Microscopic hematuria     Preventative care- needs mammo. Up to date immuniaztions. S/p hys, no need for Pap.    Had colonscopy 2010 but having change in bowel habits, will refer to GI for follow up.

## 2012-03-28 ENCOUNTER — Ambulatory Visit: Admit: 2012-03-28 | Payer: PRIVATE HEALTH INSURANCE | Attending: "Endocrinology

## 2012-03-28 DIAGNOSIS — M810 Age-related osteoporosis without current pathological fracture: Secondary | ICD-10-CM

## 2012-03-28 MED ORDER — rosuvastatin (CRESTOR) 5 MG tablet
5 | ORAL_TABLET | Freq: Every day | ORAL | 2.00 refills | 30.00000 days | Status: AC
Start: 2012-03-28 — End: 2013-04-02

## 2012-03-28 NOTE — Unmapped (Signed)
REFERRING PROVIDER: Jiles Crocker MD    OTHERS WHO NEED REPORTS: None      INDICATION(S) FOR PERFORMING THE STUDY:  postmenopausal osteoporosis (733.01)    CLINICAL INFORMATION PROVIDED BY THE PATIENT: 57 year old woman. She underwent surgical menopause at age 34. She has not taken estrogen. No history of fragility fractures.  No long-term corticosteroid use. She currently takes alendronate (started 2012). Family history of osteoporosis (sister).    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 Yes    T-scores  Initial study: 03/03/2010 spine L3-L4 -3.7 Lowest hip (left femoral neck) -1.4   Current study: 03/28/2012 spine L3-L4 -2.5 Lowest hip (left femoral neck) -1.3     The table below shows bone mineral density (grams/cm2), the appropriate measure for comparing serial scans An ???increase??? or ???decrease??? is significant based on precision studies done at our center according to the [facility] protocol.      PA spine Proximal Femur (left)   Date L3-L4 Femoral neck Trochanter Total hip   03/03/2010 0.692 0.695 0.587 0.787   03/28/2012 0.826 0.700 0.639 0.834     IMPRESSION:  BONE DENSITY IS LOW, CONSISTENT WITH OSTEOPOROSIS.  SINCE 2012, IT INCREASED SUBSTANTIALLY IN THE SPINE. IT ALSO INCREASED IN THE LEFT TROCHANTER AND TOTAL HIP. STABLE OR INCREASING BONE DENSITY INDICATES A GOOD RESPONSE TO TREATMENT.     Consider repeating this study in 1-2 years to assess the patient's progress.    Chasyn Cinque L. Yachet Mattson MD, Director, [facility]

## 2012-04-03 MED ORDER — ergocalciferol (ERGOCALCIFEROL) 50,000 unit capsule
1250 | ORAL_CAPSULE | ORAL | Status: AC
Start: 2012-04-03 — End: 2012-10-10

## 2012-04-27 ENCOUNTER — Ambulatory Visit: Admit: 2012-04-27 | Discharge: 2012-04-27 | Payer: PRIVATE HEALTH INSURANCE | Attending: Gastroenterology

## 2012-04-27 DIAGNOSIS — R198 Other specified symptoms and signs involving the digestive system and abdomen: Secondary | ICD-10-CM

## 2012-04-27 NOTE — Unmapped (Signed)
Chief Complaint   Patient presents with   ??? Constipation        History of Present Illness:   Sarah Huang is a 57 y.o. female who presents for evaluation of change in bowel movements.  Growing up had bm once every 2 weeks.  Went from there ot 3 bm/day with flattening of the bowel movement.  Incomplete evacuation.  Noted in last 2 years a streak of blood along the stool.  Brother and sister colon polyps.  Last colonoscopy was in 2010.  No melena or hematemesis.  Appetite good and had 20 lbs weight gain over the past year.  In the last month she has noted cramping with some urgency to have bm.    She takes prilosec for GERD and on so she has no GERD symptoms.  Notes she gets a lot of gas.       Past Medical History:  She has a past medical history of Hyperlipidemia; Kidney stones; Depression; and Dyslipidemia.    Medications:  Current outpatient prescriptions:alendronate (FOSAMAX) 70 MG tablet, Take 1 tablet (70 mg total) by mouth every 7 days., Disp: 12 tablet, Rfl: 3;  ergocalciferol (ERGOCALCIFEROL) 50,000 unit capsule, Take 1 capsule (50,000 Units total) by mouth once a week., Disp: 12 capsule, Rfl: 1;  naproxen sodium (ANAPROX) 220 MG tablet, Take 220 mg by mouth 2 times a day with meals., Disp: , Rfl:   omeprazole (PRILOSEC) 40 MG capsule, Take 1 capsule (40 mg total) by mouth daily., Disp: 30 capsule, Rfl: 1;  rosuvastatin (CRESTOR) 5 MG tablet, Take 1 tablet (5 mg total) by mouth daily., Disp: 30 tablet, Rfl: 5;  SUMAtriptan (IMITREX) 100 MG tablet, Take one tablet po daily as needed for migraine, may repeat in 2 hours if needed in 24 hours., Disp: 12 tablet, Rfl: 2    Allergies:  No known allergies    Family History:  Her family history includes Breast cancer in her others; COPD in her brother, father, mother, other, and sister; Coronary artery disease in her father and mother; Diabetes in her mother; Emphysema in her father; Heart attack in her brother; Heart disease in her brother and mother; Heart  failure in her father; Hyperlipidemia in her father and mother; Hypertension in her mother; and Other in her brother and sister.    Past Surgical History:  She has past surgical history that includes Tonsillectomy; Salpingoophorectomy (2004); Sinus surgery; Hysterectomy (2004); and mid urethral sling  (2002).    Social History:  She reports that she has quit smoking. She has never used smokeless tobacco. She reports that she does not drink alcohol or use illicit drugs.    The following portions of the patient's history were reviewed and updated as appropriate: allergies, current medications, past family history, past medical history, past social history, past surgical history and problem list.    Review of Systems:  * See scanned Review of Systems sheet.    Vital Signs:  Blood pressure 114/72, height 5' 3 (1.6 m), weight 201 lb (91.173 kg).  Physical Exam   Nursing note and vitals reviewed.  Constitutional: She is oriented to person, place, and time. She appears well-developed and well-nourished.   HENT:   Head: Normocephalic and atraumatic.   Eyes: EOM are normal. No scleral icterus.   Neck: Normal range of motion. Neck supple.   Cardiovascular: Normal rate, regular rhythm and normal heart sounds.  Exam reveals no gallop and no friction rub.    No murmur heard.  Pulmonary/Chest: Effort normal and breath sounds normal. No respiratory distress. She has no wheezes. She has no rales.   Abdominal: Soft. Bowel sounds are normal. She exhibits no distension and no mass. There is no tenderness. There is no rebound and no guarding.   Musculoskeletal: Normal range of motion. She exhibits no edema and no tenderness.   Neurological: She is alert and oriented to person, place, and time.   Skin: Skin is warm and dry.   Psychiatric: She has a normal mood and affect. Her behavior is normal. Judgment and thought content normal.       Review of Lab Results:  Lab Results   Component Value Date    WBC 5.9 03/27/2012    HGB 13.1  03/27/2012    HCT 40.0 03/27/2012    MCV 90.2 03/27/2012    PLT 254 03/27/2012    CREATININE 0.63 03/27/2012    BUN 19 03/27/2012    NA 141 03/27/2012    K 4.4 03/27/2012    CL 105 03/27/2012    CO2 28 03/27/2012    ALT 39 03/27/2012    AST 18 04/13/2011    ALKPHOS 68 03/27/2012    BILITOT 0.4 03/27/2012       Prior Diagnostic Testing:        Assessment & Plan:  At this point she has a change in bowel habits.  In addition she has chronic esophageal reflux.  As such recommend colonoscopy and upper endoscopy.  Will perform moderate sedation.  After complete she will f/u.        Medical Decision Making:  The following items were considered in medical decision making:  Permanent chart problem/surgery list reviewed

## 2012-05-01 NOTE — Unmapped (Signed)
Please call this patient to reschedule: Any Doctor  colonoscopy

## 2012-05-01 NOTE — Unmapped (Signed)
Called and LMOM

## 2012-05-29 MED ORDER — dextrose 5 % and 0.45 % NaCl infusion 1000 mL
INTRAVENOUS | Status: AC
Start: 2012-05-29 — End: 2012-05-29
  Administered 2012-05-29: 18:00:00 via INTRAVENOUS

## 2012-05-29 MED ORDER — midazolam (PF) (VERSED) 1 mg/mL injection
1 | INTRAMUSCULAR | Status: AC
Start: 2012-05-29 — End: ?

## 2012-05-29 MED ORDER — midazolam (PF) (VERSED) injection
1 | INTRAMUSCULAR | Status: AC | PRN
Start: 2012-05-29 — End: 2012-05-29
  Administered 2012-05-29 (×4): via INTRAVENOUS

## 2012-05-29 MED ORDER — fentaNYL (SUBLIMAZE) injection
50 | INTRAMUSCULAR | Status: AC | PRN
Start: 2012-05-29 — End: 2012-05-29
  Administered 2012-05-29 (×5): via INTRAVENOUS

## 2012-05-29 MED ORDER — diphenhydrAMINE (BENADRYL) 50 mg/mL injection
50 | INTRAMUSCULAR | Status: AC
Start: 2012-05-29 — End: ?

## 2012-05-29 MED ORDER — fentaNYL (SUBLIMAZE) 50 mcg/mL injection
50 | INTRAMUSCULAR | Status: AC
Start: 2012-05-29 — End: ?

## 2012-05-29 MED FILL — FENTANYL (PF) 50 MCG/ML INJECTION SOLUTION: 50 50 mcg/mL | INTRAMUSCULAR | Qty: 4

## 2012-05-29 MED FILL — DIPHENHYDRAMINE 50 MG/ML INJECTION SOLUTION: 50 50 mg/mL | INTRAMUSCULAR | Qty: 1

## 2012-05-29 MED FILL — MIDAZOLAM (PF) 1 MG/ML INJECTION SOLUTION: 1 1 mg/mL | INTRAMUSCULAR | Qty: 10

## 2012-05-29 NOTE — Unmapped (Signed)
Referring Physician:  Renee Pain    Indications:GERD     Consent:  The benefits, risks, and alternatives to the procedure were   discussed, and informed consent was obtained from the patient.     Preparation: EKG, pulse, pulse oximetry and blood pressure were   monitored throughout the procedure. ASA Class 2: Patient has mild to   moderate systemic disturbance that may not be related to the disorder   requiring surgery. Mallampati Classification: Class 2 - Faucial   pillars, soft palate visible.     Medications: Versed 6 mg IV throughout the procedure. Fentanyl 175   mcg IV throughout the procedure.     Estimated Blood Loss:     Procedure:  A physical exam was performed. The endoscope was passed   with ease through the mouth under direct visualization; it was   advanced to the 2nd portion of the duodenum. The scope was withdrawn   and the mucosa was carefully examined. The views were good. The   patient's toleration of the procedure was good. Retroflexion was   performed in the cardia.     Findings:   Esophagus: The esophagus appeared to be normal.  Stomach:   Multiple small sessile polyps were found in the stomach. They were   not bleeding. A cold forceps biopsy was taken.This was of a sample of   the polyps.  These polyps likely fundic gland polyps. The specimen   was collected for pathology.  Duodenum: The duodenum appeared to be   normal.     Unplanned Events:  There were no unplanned events.     Summary:  The esophagus appeared normal. Gastric polyps were found   (211.1). Biopsy taken. The duodenum appeared normal. GERD, gastric   polyps.     Recommendations: Arrange for repeat colonoscopy under MAC.     Procedure Codes: [43239]EGD with biopsy     Performed By: The procedure was performed by Ruston Fedora P. Andi Hence, MD.     Fellows:  Version 1, electronically signed by Dr. Langley Adie, M.D. on   05/29/2012 at 15:13.

## 2012-05-29 NOTE — Unmapped (Signed)
Referring Physician:  Renee Pain    Indications: rectal bleeding, change in bowel habits.     Consent:  The benefits, risks, and alternatives to the procedure were   discussed, and informed consent was obtained from the patient.     Preparation: EKG, pulse, pulse oximetry and blood pressure were   monitored throughout the procedure. An intravenous line was inserted.   ASA Class 2: Patient has mild to moderate systemic disturbance that   may not be related to the disorder requiring surgery. Mallampati   Classification: Class 2 - Faucial pillars, soft palate visible.     Medications: Patient was previously sedated.     Estimated Blood Loss:  None.     Rectal Exam:  Normal rectal exam.     Procedure:  The endoscope was passed with ease through the anus under   direct visualization; it was advanced to the transverse colon. In   spite of significant amount of sedative given, I could not have the   patient comfortable enough to proceed beyond the distal transverse   colon. The procedure was aborted at that point.the scope was   withdrawn and the mucosa was carefully examined. The views were good.   The quality of the preparation was good. The patient's toleration of   the procedure was poor.     Findings:  The colon appeared to be normal. There was no evidence of   polyps, mass lesion, or angioectasia/AVM in the colon.     Unplanned Events:  There were no unplanned events.     Summary:  The colon appeared normal.     Recommendations:Call the office to arrange for completion colonoscopy   under MAC.     Procedure Codes:  [45378]Colonoscopy     Performed By: The procedure was performed by Arland Usery P. Andi Hence, MD.     Fellow:  Version 1, electronically signed by Dr. Langley Adie, M.D. on   05/29/2012 at 15:21.

## 2012-05-29 NOTE — Unmapped (Signed)
[facility]  ENDOSCOPY INSTRUCTION/RELEASE FORMS    Date: 05/29/2012  Procedure: Procedure(s) with comments:  EGD  COLONOSCOPY W/ OR W/O BIOPSY - limited colonoscopy.    IF YOU DEVELOP EXCESSIVE BLEEDING, UNCONTROLLED PAIN OR SHORTNESS OF BREATH, GO TO THE EMERGENCY DEPARTMENT FOR AN EXAMINATION.  IF YOU DEVELOP CHILLS, FEVER (greater than 100.5) OR HAVE CONCERNS ABOUT YOUR RECOVERY, CALL Dr Andi Hence        AT    [phone].    1. You may experience lightheadedness and nausea.  Rest today; no strenuous activity.  2. DO NOT:  ?? Drink alcoholic beverages today.  ?? Make serious financial or legal decisions today.  ?? Operate automobiles, heavy machinery or use any appliances today.  3. Diet:  ?? Resume your diet as tolerated or recommended by your primary care provider.  4. Medications:  ?? The treatment you received today does not change your current medications.  ?? New/revised prescriptions given to patient?: No.  5. Side Effects You May Experience:  ?? Sore throat.  ?? Cramping, gas, bloating, belching.  6. Additional:  ?? Call Dr Georga Bora office for biopsy results and reschedule colonoscopy with MAC anesthesia.        All Patient Belongings Have Been Returned: _________(patient initials)  I understand and acknowledge receipt of the above instructions.                                                                                                                                          Patient or Guardian Signature                                                         Date/Time                                                                                                                                            Physician's or R.N.'s Signature  Date/Time      The discharge instructions have been reviewed with the patient and/or Guardian.  Patient and/or Guardian signed and retained a printed copy.

## 2012-05-29 NOTE — Unmapped (Signed)
Pre-sedation Endoscopy History and Physical      05/29/2012  1:31 PM    Sarah Huang is an 57 y.o. female who is here for a   ESOPHAGOSCOPY / EGD     Reason for exam:  GERD, rectal bleeding    Patient Active Problem List   Diagnosis   ??? Esophageal reflux   ??? Elevated blood pressure reading without diagnosis of hypertension   ??? Calculus of kidney   ??? Plantar fascial fibromatosis   ??? Insomnia, unspecified   ??? Family history of diabetes mellitus   ??? Osteoporosis, unspecified   ??? Postablative ovarian failure   ??? Fatty liver   ??? Weight gain   ??? Hypercholesteremia   ??? Urinary retention with incomplete bladder emptying   ??? Hearing loss   ??? Trochanteric bursitis of left hip   ??? SUI (stress urinary incontinence, female)   ??? Post-void dribbling   ??? Microscopic hematuria   ??? Flank pain   ??? Fibromyalgia       History:  Past Medical History   Diagnosis Date   ??? Hyperlipidemia    ??? Kidney stones    ??? Depression    ??? Dyslipidemia      Past Surgical History   Procedure Laterality Date   ??? Tonsillectomy       in the past   ??? Salpingoophorectomy  2004     Hysty/BSO-etopic pregnancy/prolapse   ??? Sinus surgery       hx sinus surg x4   ??? Hysterectomy  2004     Hysty/BSO 2004-etopic pregnancy/prolapse   ??? Mid urethral sling   2002     at time of [facility]     Family History   Problem Relation Age of Onset   ??? Diabetes Mother    ??? Hypertension Mother    ??? Coronary artery disease Mother    ??? Heart disease Mother      Triple Bypass   ??? COPD Mother    ??? Hyperlipidemia Mother    ??? Coronary artery disease Father    ??? Hyperlipidemia Father    ??? COPD Father    ??? Heart failure Father      CHF   ??? Emphysema Father    ??? Other Sister      Blood Disorder   ??? COPD Sister    ??? Heart attack Brother    ??? COPD Brother    ??? Heart disease Brother      1 brother -Triple Bypass   ??? Other Brother      Heart Defect, Open heart surgery   ??? Breast cancer Other    ??? COPD Other    ??? Breast cancer Other      History   Substance Use Topics   ??? Smoking status: Former  Smoker   ??? Smokeless tobacco: Never Used      Comment: 02/23/2009, no passive smoke exposure 04/15/2009   ??? Alcohol Use: No      Comment: Rare 03-27-12     Allergies   Allergen Reactions   ??? No Known Allergies          Home Medications:   Prescriptions prior to admission   Medication Sig Dispense Refill   ??? alendronate (FOSAMAX) 70 MG tablet Take 1 tablet (70 mg total) by mouth every 7 days.  12 tablet  3   ??? ergocalciferol (ERGOCALCIFEROL) 50,000 unit capsule Take 1 capsule (50,000 Units total) by mouth once a week.  12  capsule  1   ??? naproxen sodium (ANAPROX) 220 MG tablet Take 220 mg by mouth 2 times a day with meals.       ??? omeprazole (PRILOSEC) 40 MG capsule Take 1 capsule (40 mg total) by mouth daily.  30 capsule  1   ??? rosuvastatin (CRESTOR) 5 MG tablet Take 1 tablet (5 mg total) by mouth daily.  30 tablet  5   ??? SUMAtriptan (IMITREX) 100 MG tablet Take one tablet po daily as needed for migraine, may repeat in 2 hours if needed in 24 hours.  12 tablet  2       ROS:  Chest pain?  No  Shortness of breath?   No  Recent illness?   No  Prior Anesthesia?   Yes  Prior reaction to Anesthesia?   No    Review of Nursing History/Assessment:  Yes    IV site verified?   Yes    Vitals:  There were no vitals taken for this visit.    Physical Exam:  Neuro:  Alert and Oriented x 4, Follows commands  Cardio:  Regular rate and rhythm, no murmurs/rub/gallop  Pulm:  Clear to auscultation bilaterally, no wheezes, rhonchi, rub  Abdomen:  Soft, nontender, nondistended.  Positive bowel sounds.    Airway Assessment:  within normal limits    Mallampati Classification:  II (hard and soft palate, upper portion of tonsils anduvula visible)    ASA Classification:  ASA 2 - Patient with mild systemic disease with no functional limitations    Relevant diagnostic tests and images reviewed:   Yes    Medication Reconciliation Reviewed:  Yes     Labs:   Lab Results   Component Value Date    GLUCOSE 97 03/27/2012    BUN 19 03/27/2012    CO2 28  03/27/2012    CREATININE 0.63 03/27/2012    K 4.4 03/27/2012    NA 141 03/27/2012    CL 105 03/27/2012    CALCIUM 9.2 03/27/2012     Lab Results   Component Value Date    WBC 5.9 03/27/2012    HGB 13.1 03/27/2012    HCT 40.0 03/27/2012    MCV 90.2 03/27/2012    PLT 254 03/27/2012     No results found for this basename: PT, PTT, INR       Assessment/Plan:  ESOPHAGOSCOPY / EGD     Sedation plan (type/medications): Moderate Sedation fentanyl and midazolam    Site Marked:  Not Applicable    Patient / Surrogate has consented for the above procedure.  Yes     This patient is in satisfactory condition for the procedure and sedation/anesthesia.   Yes     This patient was reevaluated immediately prior to procedure/sedation.  Any changes are noted below.   Yes       Johney Frame  05/29/2012

## 2012-05-31 NOTE — Unmapped (Signed)
Patient would like to schedule colonoscopy.

## 2012-05-31 NOTE — Unmapped (Signed)
Called and LMOM.

## 2012-06-05 NOTE — Unmapped (Signed)
Pt called and schedule for June 18th at 7:00am

## 2012-06-27 MED ORDER — nalOXone (NARCAN) injection 0.04 mg
0.4 | INTRAMUSCULAR | Status: AC | PRN
Start: 2012-06-27 — End: 2012-06-27

## 2012-06-27 MED ORDER — lactated ringers infusion
INTRAVENOUS | Status: AC
Start: 2012-06-27 — End: 2012-06-27

## 2012-06-27 MED ORDER — propofol 10 mg/ml (DIPRIVAN) injection
10 | INTRAVENOUS | Status: AC | PRN
Start: 2012-06-27 — End: 2012-06-27
  Administered 2012-06-27 (×4): via INTRAVENOUS

## 2012-06-27 MED ORDER — propofol (DIPRIVAN) infusion 10 mg/mL
10 | INTRAVENOUS | Status: AC | PRN
Start: 2012-06-27 — End: 2012-06-27
  Administered 2012-06-27: 13:00:00 via INTRAVENOUS

## 2012-06-27 MED ORDER — lidocaine (XYLOCAINE) 20 mg/mL (2 %) injection
20 | INTRAMUSCULAR | Status: AC | PRN
Start: 2012-06-27 — End: 2012-06-27
  Administered 2012-06-27: 13:00:00 via INTRAVENOUS

## 2012-06-27 MED ORDER — lactated ringers infusion
INTRAVENOUS | Status: AC
Start: 2012-06-27 — End: 2012-06-27
  Administered 2012-06-27 (×2): via INTRAVENOUS

## 2012-06-27 NOTE — Unmapped (Signed)
Referring Physician:  Renee Pain    Indications: rectal bleeding     Consent:  The benefits, risks, and alternatives to the procedure were   discussed, and informed consent was obtained from the patient.     Preparation: EKG, pulse, pulse oximetry and blood pressure were   monitored throughout the procedure. An intravenous line was inserted.   ASA Class 2: Patient has mild to moderate systemic disturbance that   may not be related to the disorder requiring surgery. Mallampati   Classification: Class 2 - Faucial pillars, soft palate visible.     Medications: MAC Anesthesia administered.     Estimated Blood Loss:  None.     Rectal Exam:  Normal rectal exam.     Procedure:  The endoscope was passed with ease through the anus under   direct visualization; it was advanced to the cecum, confirmed by   ileocecal valve. The scope was withdrawn and the mucosa was carefully   examined. The views were good. The quality of the preparation was   good. Retroflexion was performed in the anus.     Findings:  There was no evidence of colitis, aphthae, mass lesion, or   polyps in the colon. Diminutive internal hemorrhoids were found. The   hemorrhoids were not bleeding.     Unplanned Events:  There were no unplanned events.     Summary:  Diminutive internal hemorrhoids. The hemorrhoids were not   bleeding.     Recommendations: F/U office in 3-4 weeks.     Procedure Codes:  [45378]Colonoscopy     Performed By: The procedure was performed by Mulan Adan P. Andi Hence, MD.     Fellow:  Version 1, electronically signed by Dr. Langley Adie, M.D. on   06/27/2012 at 09:38.

## 2012-06-27 NOTE — Unmapped (Signed)
[facility]  ENDOSCOPY INSTRUCTION/RELEASE FORMS    Date: 06/27/2012  Procedure: Procedure(s) with comments:  COLONOSCOPY W/ OR W/O BIOPSY - colonoscopy    IF YOU DEVELOP EXCESSIVE BLEEDING, UNCONTROLLED PAIN OR SHORTNESS OF BREATH, GO TO THE EMERGENCY DEPARTMENT FOR AN EXAMINATION.  IF YOU DEVELOP CHILLS, FEVER (greater than 100.5) OR HAVE CONCERNS ABOUT YOUR RECOVERY, CALL Dr. Andi Hence AT [phone].    1. You may experience lightheadedness and nausea.  Rest today; no strenuous activity.  2. DO NOT:  ?? Drink alcoholic beverages today.  ?? Make serious financial or legal decisions today.  ?? Operate automobiles, heavy machinery or use any appliances today.  ?? Consult your physician about taking over the counter medications such as Motrin, Advil, Aleve, Ibuprofen or Naprosyn if you've had polyps removed.  3. Diet:  ?? Resume your diet as tolerated or recommended by your primary care provider.  4. Medications:  ?? The treatment you received today does not change your current medications.  ?? New/revised prescriptions given to patient?: No.  5. Side Effects You May Experience:  ?? Cramping, gas, bloating, belching.  ?? Blood tinged mucous from rectum.  6. Additional:  ?? Follow up with your PCP for routine appointment/further testing as needed.      All Patient Belongings Have Been Returned: _________(patient initials)  I understand and acknowledge receipt of the above instructions.                                                                                                                                          Patient or Guardian Signature                                                         Date/Time                                                                                                                                            Physician's or R.N.'s Signature  Date/Time      The discharge instructions have been  reviewed with the patient and/or Guardian.  Patient and/or Guardian signed and retained a printed copy.

## 2012-06-27 NOTE — Unmapped (Signed)
Anesthesia Transfer of Care Note    Patient: Sarah Huang  Procedure(s) Performed: Procedure(s) with comments:  COLONOSCOPY W/ OR W/O BIOPSY - colonoscopy    Patient location: Endoscopy PACU    Post pain: Adequate analgesia    Post assessment: no apparent anesthetic complications, tolerated procedure well and no evidence of recall    Post vital signs:    Filed Vitals:    06/27/12 0746   BP: 121/55   Pulse: 65   Temp: 97.6 ??F (36.4 ??C)   Resp: 14   SpO2: 98%       Level of consciousness: sedated    Complications: None

## 2012-06-27 NOTE — Unmapped (Signed)
Pre-sedation Endoscopy History and Physical      06/27/2012  8:39 AM    CALLYN SEVERTSON is an 57 y.o. female who is here for a   COLONOSCOPY W/ OR W/O BIOPSY     Reason for exam:  Rectal bleeding, family hx or polyps    Patient Active Problem List   Diagnosis   ??? Esophageal reflux   ??? Elevated blood pressure reading without diagnosis of hypertension   ??? Calculus of kidney   ??? Plantar fascial fibromatosis   ??? Insomnia, unspecified   ??? Family history of diabetes mellitus   ??? Osteoporosis, unspecified   ??? Postablative ovarian failure   ??? Fatty liver   ??? Weight gain   ??? Hypercholesteremia   ??? Urinary retention with incomplete bladder emptying   ??? Hearing loss   ??? Trochanteric bursitis of left hip   ??? SUI (stress urinary incontinence, female)   ??? Post-void dribbling   ??? Microscopic hematuria   ??? Flank pain   ??? Fibromyalgia   ??? Rectal bleeding       History:  Past Medical History   Diagnosis Date   ??? Hyperlipidemia    ??? Kidney stones    ??? Depression    ??? Dyslipidemia      Past Surgical History   Procedure Laterality Date   ??? Tonsillectomy       in the past   ??? Salpingoophorectomy  2004     Hysty/BSO-etopic pregnancy/prolapse   ??? Sinus surgery       hx sinus surg x4   ??? Hysterectomy  2004     Hysty/BSO 2004-etopic pregnancy/prolapse   ??? Mid urethral sling   2002     at time of [facility]   ??? Esophagogastroduodenoscopy N/A 05/29/2012     Procedure: EGD;  Surgeon: Langley Adie, MD;  Location: [facility] END[facility];  Service: Gastroenterology;  Laterality: N/A;   ??? Colonoscopy N/A 05/29/2012     Procedure: COLONOSCOPY W/ OR W/O BIOPSY;  Surgeon: Langley Adie, MD;  Location: [facility] ENDOSCOPY;  Service: Gastroenterology;  Laterality: N/A;  limited colonoscopy.     Family History   Problem Relation Age of Onset   ??? Diabetes Mother    ??? Hypertension Mother    ??? Coronary artery disease Mother    ??? Heart disease Mother      Triple Bypass   ??? COPD Mother    ??? Hyperlipidemia Mother    ??? Coronary artery disease Father    ??? Hyperlipidemia Father    ??? COPD  Father    ??? Heart failure Father      CHF   ??? Emphysema Father    ??? Other Sister      Blood Disorder   ??? COPD Sister    ??? Heart attack Brother    ??? COPD Brother    ??? Heart disease Brother      1 brother -Triple Bypass   ??? Other Brother      Heart Defect, Open heart surgery   ??? Breast cancer Other    ??? COPD Other    ??? Breast cancer Other      History   Substance Use Topics   ??? Smoking status: Former Smoker   ??? Smokeless tobacco: Never Used      Comment: 02/23/2009, no passive smoke exposure 04/15/2009   ??? Alcohol Use: No      Comment: Rare 03-27-12     Allergies   Allergen Reactions   ???  No Known Allergies          Home Medications:   Prescriptions prior to admission   Medication Sig Dispense Refill   ??? alendronate (FOSAMAX) 70 MG tablet Take 1 tablet (70 mg total) by mouth every 7 days.  12 tablet  3   ??? ergocalciferol (ERGOCALCIFEROL) 50,000 unit capsule Take 1 capsule (50,000 Units total) by mouth once a week.  12 capsule  1   ??? fexofenadine (ALLEGRA) 30 MG tablet Take 30 mg by mouth 2 times a day.       ??? naproxen sodium (ANAPROX) 220 MG tablet Take 220 mg by mouth 2 times a day with meals.       ??? omeprazole (PRILOSEC) 40 MG capsule Take 1 capsule (40 mg total) by mouth daily.  30 capsule  1   ??? rosuvastatin (CRESTOR) 5 MG tablet Take 1 tablet (5 mg total) by mouth daily.  30 tablet  5   ??? SUMAtriptan (IMITREX) 100 MG tablet Take one tablet po daily as needed for migraine, may repeat in 2 hours if needed in 24 hours.  12 tablet  2       ROS:  Chest pain?  No  Shortness of breath?   No  Recent illness?   No  Prior Anesthesia?   Yes  Prior reaction to Anesthesia?   No    Review of Nursing History/Assessment:  Yes    IV site verified?   Yes    Vitals:  Blood pressure 121/55, pulse 65, temperature 97.6 ??F (36.4 ??C), temperature source Oral, resp. rate 14, height 5' 3 (1.6 m), weight 201 lb (91.173 kg), SpO2 98.00%.    Physical Exam:  Neuro:  Alert and Oriented x 4, Follows commands  Cardio:  Regular rate and rhythm, no  murmurs/rub/gallop  Pulm:  Clear to auscultation bilaterally, no wheezes, rhonchi, rub  Abdomen:  Soft, nontender, nondistended.  Positive bowel sounds.    Airway Assessment:  within normal limits    Mallampati Classification:  II (hard and soft palate, upper portion of tonsils anduvula visible)    ASA Classification:  ASA 2 - Patient with mild systemic disease with no functional limitations    Relevant diagnostic tests and images reviewed:   Yes    Medication Reconciliation Reviewed:  Yes     Labs:   Lab Results   Component Value Date    GLUCOSE 97 03/27/2012    BUN 19 03/27/2012    CO2 28 03/27/2012    CREATININE 0.63 03/27/2012    K 4.4 03/27/2012    NA 141 03/27/2012    CL 105 03/27/2012    CALCIUM 9.2 03/27/2012     Lab Results   Component Value Date    WBC 5.9 03/27/2012    HGB 13.1 03/27/2012    HCT 40.0 03/27/2012    MCV 90.2 03/27/2012    PLT 254 03/27/2012     No results found for this basename: PT, PTT, INR       Assessment/Plan:  COLONOSCOPY W/ OR W/O BIOPSY     Sedation plan (type/medications): Moderate Sedation fentanyl and versed.    Site Marked:  Not Applicable    Patient / Surrogate has consented for the above procedure.  Yes     This patient is in satisfactory condition for the procedure and sedation/anesthesia.   Yes     This patient was reevaluated immediately prior to procedure/sedation.  Any changes are noted below.   Yes  Kaydince Towles Bosie Helper  06/27/2012

## 2012-06-27 NOTE — Unmapped (Addendum)
ANESTHESIOLOGY PRE-PROCEDURAL EVALUATION    Sarah Huang is a 57 y.o. year old female presenting for:    Procedure(s):  COLONOSCOPY W/ OR W/O BIOPSY    Surgeon:   Langley Adie, MD    Anesthesia Evaluation    Patient summary reviewed and nursing notes reviewed.       No history of anesthetic complications   I have reviewed the History and Physical Exam, any relevant changes are noted in the anesthesia pre-operative evaluation.    Medical History / Review of Systems:    Cardiovascular:    Exercise tolerance: good  Duke Met score: 3 - Walking on a flat surface for one or two blocks.    Neuro/Muscoloskeletal/Psych:    (+) psychiatric history (depression).      Pulmonary:    Active tuberculosis.  ROS comment: Obstructive sleep apnea      GI/Hepatic/Renal:    GERD is.      Comments: Kidney stones    Endo/Other:          PAST MEDICAL HISTORY:  Past Medical History   Diagnosis Date   ??? Hyperlipidemia    ??? Kidney stones    ??? Depression    ??? Dyslipidemia        PAST SURGICAL HISTORY:  Past Surgical History   Procedure Laterality Date   ??? Tonsillectomy       in the past   ??? Salpingoophorectomy  2004     Hysty/BSO-etopic pregnancy/prolapse   ??? Sinus surgery       hx sinus surg x4   ??? Hysterectomy  2004     Hysty/BSO 2004-etopic pregnancy/prolapse   ??? Mid urethral sling   2002     at time of [facility]   ??? Esophagogastroduodenoscopy N/A 05/29/2012     Procedure: EGD;  Surgeon: Langley Adie, MD;  Location: [facility];  Service: Gastroenterology;  Laterality: N/A;   ??? Colonoscopy N/A 05/29/2012     Procedure: COLONOSCOPY W/ OR W/O BIOPSY;  Surgeon: Langley Adie, MD;  Location: [facility];  Service: Gastroenterology;  Laterality: N/A;  limited colonoscopy.       FAMILY HISTORY:  Family History   Problem Relation Age of Onset   ??? Diabetes Mother    ??? Hypertension Mother    ??? Coronary artery disease Mother    ??? Heart disease Mother      Triple Bypass   ??? COPD Mother    ??? Hyperlipidemia Mother    ??? Coronary artery disease Father    ???  Hyperlipidemia Father    ??? COPD Father    ??? Heart failure Father      CHF   ??? Emphysema Father    ??? Other Sister      Blood Disorder   ??? COPD Sister    ??? Heart attack Brother    ??? COPD Brother    ??? Heart disease Brother      1 brother -Triple Bypass   ??? Other Brother      Heart Defect, Open heart surgery   ??? Breast cancer Other    ??? COPD Other    ??? Breast cancer Other        SOCIAL HISTORY:  History     Social History   ??? Marital Status: Divorced     Spouse Name: N/A     Number of Children: N/A   ??? Years of Education: N/A     Occupational History   ??? Not on file.  Social History Main Topics   ??? Smoking status: Former Smoker   ??? Smokeless tobacco: Never Used      Comment: 02/23/2009, no passive smoke exposure 04/15/2009   ??? Alcohol Use: No      Comment: Rare 03-27-12   ??? Drug Use: No      Comment: 03-27-12   ??? Sexually Active: Not on file     Other Topics Concern   ??? Caffeine Use Yes   ??? Occupational Exposure No   ??? Exercise No   ??? Seat Belt Yes     Social History Narrative   ??? No narrative on file       MEDICATIONS:  No current facility-administered medications on file prior to encounter.     Current Outpatient Prescriptions on File Prior to Encounter   Medication Sig Dispense Refill   ??? alendronate (FOSAMAX) 70 MG tablet Take 1 tablet (70 mg total) by mouth every 7 days.  12 tablet  3   ??? ergocalciferol (ERGOCALCIFEROL) 50,000 unit capsule Take 1 capsule (50,000 Units total) by mouth once a week.  12 capsule  1   ??? fexofenadine (ALLEGRA) 30 MG tablet Take 30 mg by mouth 2 times a day.       ??? naproxen sodium (ANAPROX) 220 MG tablet Take 220 mg by mouth 2 times a day with meals.       ??? omeprazole (PRILOSEC) 40 MG capsule Take 1 capsule (40 mg total) by mouth daily.  30 capsule  1   ??? rosuvastatin (CRESTOR) 5 MG tablet Take 1 tablet (5 mg total) by mouth daily.  30 tablet  5   ??? SUMAtriptan (IMITREX) 100 MG tablet Take one tablet po daily as needed for migraine, may repeat in 2 hours if needed in 24 hours.  12 tablet   2       ALLERGIES:  Allergies   Allergen Reactions   ??? No Known Allergies        VITAL SIGNS:  Wt Readings from Last 3 Encounters:   06/27/12 201 lb (91.173 kg)   06/27/12 201 lb (91.173 kg)   05/29/12 201 lb (91.173 kg)     Ht Readings from Last 3 Encounters:   06/27/12 5' 3 (1.6 m)   06/27/12 5' 3 (1.6 m)   05/29/12 5' 3 (1.6 m)     Temp Readings from Last 3 Encounters:   06/27/12 97.6 ??F (36.4 ??C) Oral   06/27/12 97.6 ??F (36.4 ??C) Oral   05/29/12 98.1 ??F (36.7 ??C)      BP Readings from Last 3 Encounters:   06/27/12 121/55   06/27/12 121/55   05/29/12 163/98     Pulse Readings from Last 3 Encounters:   06/27/12 65   06/27/12 65   05/29/12 73     SpO2 Readings from Last 3 Encounters:   06/27/12 98%   06/27/12 98%   05/29/12 96%       Physical Exam:    Airway:     Mallampati: III  TM distance: > = 3 FB  Neck ROM: full  (-) no facial hair, neck not short      Dental:        Pulmonary:       Breath sounds clear to auscultation.       Cardiovascular:     Rhythm: regular  Rate: normal    Neuro/Musculoskeletal/Psych:    Mental status: alert and oriented to person, place and time.  Motor strength is normal.      Abdominal:     Abdomen: soft.    Current OB Status:       Other Findings:        LAB RESULTS:  Lab Results   Component Value Date    WBC 5.9 03/27/2012    HGB 13.1 03/27/2012    HCT 40.0 03/27/2012    MCV 90.2 03/27/2012    PLT 254 03/27/2012       No results found for this basename: ABORH       Lab Results   Component Value Date    GLUCOSE 97 03/27/2012    BUN 19 03/27/2012    CO2 28 03/27/2012    CREATININE 0.63 03/27/2012    K 4.4 03/27/2012    NA 141 03/27/2012    CL 105 03/27/2012    CALCIUM 9.2 03/27/2012    ALBUMIN 4.2 03/27/2012    PROT 7.3 03/27/2012    ALKPHOS 68 03/27/2012    ALT 39 03/27/2012    AST 18 04/13/2011    BILITOT 0.4 03/27/2012       No results found for this basename: PT, PTT, INR       No results found for this basename: PREGTESTUR, PREGSERUM, HCG, HCGQUANT       Anesthesia Plan:    ASA 3      Anesthesia Type:  MAC.     Intravenous induction.    Anesthetic plan and risks discussed with patient.    Plan, alternatives, and risks of anesthesia, including death, have been explained to and discussed with the patient/legal guardian.  By my assessment, the patient/legal guardian understands and agrees.  Scenario presented in detail.  Questions answered.      Plan discussed with CRNA and attending.

## 2012-06-27 NOTE — Unmapped (Signed)
Anesthesia Post Note    Patient: Sarah Huang    Procedure(s) Performed: Procedure(s) with comments:  COLONOSCOPY W/ OR W/O BIOPSY - colonoscopy    Anesthesia type: MAC    Patient location: Endoscopy PACU    Post pain: Adequate analgesia    Post assessment: no apparent anesthetic complications, tolerated procedure well and no evidence of recall    Last Vitals:   Filed Vitals:    06/27/12 1000   BP: 96/54   Pulse: 65   Temp:    Resp: 16   SpO2: 95%       Post vital signs: stable    Level of consciousness: awake, alert  and oriented    Complications: None

## 2012-07-12 MED ORDER — omeprazole (PRILOSEC) 40 MG capsule
40 | ORAL_CAPSULE | ORAL | Status: AC
Start: 2012-07-12 — End: 2013-01-29

## 2012-07-12 NOTE — Unmapped (Signed)
Last refill and OV 03-27-12, no appt sch.

## 2012-08-05 NOTE — Unmapped (Signed)
Pt coming in 7-28 for labs, orders in EPIC for lipids and vit D.

## 2012-08-06 ENCOUNTER — Ambulatory Visit: Admit: 2012-08-06 | Payer: PRIVATE HEALTH INSURANCE | Attending: Gastroenterology

## 2012-08-06 DIAGNOSIS — K219 Gastro-esophageal reflux disease without esophagitis: Secondary | ICD-10-CM

## 2012-08-06 NOTE — Unmapped (Signed)
Chief Complaint   Patient presents with   ??? Gastroesophageal Reflux        History of Present Illness:   Sarah Huang is a 57 y.o. female who presents in followup.  Colon negative.  Still notes feeling of incomplete evacuation.        Past Medical History:  She has a past medical history of Hyperlipidemia; Kidney stones; Depression; and Dyslipidemia.    Medications:  Current outpatient prescriptions:alendronate (FOSAMAX) 70 MG tablet, Take 1 tablet (70 mg total) by mouth every 7 days., Disp: 12 tablet, Rfl: 3;  ergocalciferol (ERGOCALCIFEROL) 50,000 unit capsule, Take 1 capsule (50,000 Units total) by mouth once a week., Disp: 12 capsule, Rfl: 1;  fexofenadine (ALLEGRA) 30 MG tablet, Take 30 mg by mouth 2 times a day., Disp: , Rfl:   omeprazole (PRILOSEC) 40 MG capsule, TAKE 1 CAPSULE BY MOUTH DAILY, Disp: 30 capsule, Rfl: 5;  rosuvastatin (CRESTOR) 5 MG tablet, Take 1 tablet (5 mg total) by mouth daily., Disp: 30 tablet, Rfl: 5;  SUMAtriptan (IMITREX) 100 MG tablet, Take one tablet po daily as needed for migraine, may repeat in 2 hours if needed in 24 hours., Disp: 12 tablet, Rfl: 2    Allergies:  No known allergies    Family History:  Her family history includes Breast cancer in her others; COPD in her brother, father, mother, other, and sister; Coronary artery disease in her father and mother; Diabetes in her mother; Emphysema in her father; Heart attack in her brother; Heart disease in her brother and mother; Heart failure in her father; Hyperlipidemia in her father and mother; Hypertension in her mother; and Other in her brother and sister.    Past Surgical History:  She has past surgical history that includes Tonsillectomy; Salpingoophorectomy (2004); Sinus surgery; Hysterectomy (2004); mid urethral sling  (2002); Esophagogastroduodenoscopy (N/A, 05/29/2012); Colonoscopy (N/A, 05/29/2012); and Colonoscopy (N/A, 06/27/2012).    Social History:  She reports that she has quit smoking. She has never used  smokeless tobacco. She reports that she does not drink alcohol or use illicit drugs.    The following portions of the patient's history were reviewed and updated as appropriate: allergies, current medications, past family history, past medical history, past social history, past surgical history and problem list.    Review of Systems:  * See scanned Review of Systems sheet.    Vital Signs:  Blood pressure 126/77, pulse 72, height 5' 3 (1.6 m), weight 208 lb (94.348 kg).  Physical Exam   Constitutional: She is oriented to person, place, and time. She appears well-developed and well-nourished.   HENT:   Head: Normocephalic and atraumatic.   Eyes: EOM are normal. No scleral icterus.   Neck: Normal range of motion. Neck supple.   Cardiovascular: Normal rate, regular rhythm and normal heart sounds.  Exam reveals no gallop and no friction rub.    No murmur heard.  Pulmonary/Chest: Effort normal and breath sounds normal. No respiratory distress. She has no wheezes. She has no rales.   Abdominal: Soft. Bowel sounds are normal. She exhibits no distension and no mass. There is no tenderness. There is no rebound and no guarding.   Musculoskeletal: Normal range of motion. She exhibits no edema and no tenderness.   Neurological: She is alert and oriented to person, place, and time.   Skin: Skin is warm and dry.   Psychiatric: She has a normal mood and affect. Her behavior is normal. Judgment and thought content normal.  Review of Lab Results:  Lab Results   Component Value Date    WBC 5.9 03/27/2012    HGB 13.1 03/27/2012    HCT 40.0 03/27/2012    MCV 90.2 03/27/2012    PLT 254 03/27/2012    CREATININE 0.63 03/27/2012    BUN 19 03/27/2012    NA 141 03/27/2012    K 4.4 03/27/2012    CL 105 03/27/2012    CO2 28 03/27/2012    ALT 39 03/27/2012    AST 18 04/13/2011    ALKPHOS 68 03/27/2012    BILITOT 0.4 03/27/2012       Prior Diagnostic Testing:        Assessment & Plan:  S/P EGD and colonoscopy.  At this time will wait and watch.  If  continues with bleeding, may consider referral to [facility].        Medical Decision Making:  The following items were considered in medical decision making:  Permanent chart problem/surgery list reviewed  Permanent chart chronic medication/allergy list reviewed  Permanent chart social/family history reviewed  PFSH and Health Screening (patient reported) reviewed  Rev iew old records

## 2012-10-01 NOTE — Unmapped (Signed)
Pt needs to come in to get vitamin D level checked  Before will refill weekly supplement.  May not need the high dose any longer.  She can make appt or just come in for blood work.

## 2012-10-02 NOTE — Unmapped (Signed)
Spoke with pt she has orders in the chart for a Lipid and Vit D. Pt stated she will come in Monday fasting to get labs done.

## 2012-10-08 ENCOUNTER — Inpatient Hospital Stay: Admit: 2012-10-08 | Payer: PRIVATE HEALTH INSURANCE

## 2012-10-08 DIAGNOSIS — E559 Vitamin D deficiency, unspecified: Secondary | ICD-10-CM

## 2012-10-08 LAB — LIPID PANEL
Cholesterol, Total: 147 mg/dL (ref 0–200)
HDL: 43 mg/dL (ref 30–60)
LDL Cholesterol: 73 mg/dL (ref 0–160)
Triglycerides: 157 mg/dL (ref 10–150)

## 2012-10-08 LAB — VITAMIN D 25 HYDROXY: Vit D, 25-Hydroxy: 35.3 ng/mL (ref 30.0–100)

## 2012-10-10 MED ORDER — ergocalciferol (ERGOCALCIFEROL) 50,000 unit capsule
1250 | ORAL_CAPSULE | ORAL | Status: AC
Start: 2012-10-10 — End: 2013-04-10

## 2012-11-20 ENCOUNTER — Ambulatory Visit: Admit: 2012-11-20 | Discharge: 2012-11-20 | Payer: PRIVATE HEALTH INSURANCE

## 2012-11-20 DIAGNOSIS — R42 Dizziness and giddiness: Secondary | ICD-10-CM

## 2012-11-20 LAB — POCT URINALYSIS DIPSTICK, NONAUTOMATED; W/O MICRO
POCT - Bilirubin, UA: NEGATIVE
POCT - Glucose, UA: NEGATIVE
POCT - Ketones, UA: NEGATIVE
POCT - Leukocytes Esterase, UA: NEGATIVE
POCT - Nitrite, UA: NEGATIVE
POCT - Protein, UA: NEGATIVE
POCT - Specific Gravity, Urine: 1.03
POCT - Urobilinogen, UA: NEGATIVE
POCT - pH, UA: 5

## 2012-11-20 MED ORDER — montelukast (SINGULAIR) 10 mg tablet
10 | ORAL_TABLET | Freq: Every evening | ORAL | Status: AC
Start: 2012-11-20 — End: 2013-07-08

## 2012-11-20 MED ORDER — omega-3 fatty acids-fish oil (FISH OIL) 360-1,200 mg Cap
360-1200 | ORAL_CAPSULE | Freq: Every day | ORAL | 2.00 refills | 30.00000 days | Status: AC
Start: 2012-11-20 — End: 2014-07-25

## 2012-11-20 NOTE — Unmapped (Addendum)
Lotrimin cream to area on feet daily.Back Exercises  Back exercises help treat and prevent back injuries. The goal of back exercises is to increase the strength of your abdominal and back muscles and the flexibility of your back. These exercises should be started when you no longer have back pain. Back exercises include:  ?? Pelvic Tilt. Lie on your back with your knees bent. Tilt your pelvis until the lower part of your back is against the floor. Hold this position 5 to 10 sec and repeat 5 to 10 times.  ?? Knee to Chest. Pull first 1 knee up against your chest and hold for 20 to 30 seconds, repeat this with the other knee, and then both knees. This may be done with the other leg straight or bent, whichever feels better.  ?? Sit-Ups or Curl-Ups. Bend your knees 90 degrees. Start with tilting your pelvis, and do a partial, slow sit-up, lifting your trunk only 30 to 45 degrees off the floor. Take at least 2 to 3 seconds for each sit-up. Do not do sit-ups with your knees out straight. If partial sit-ups are difficult, simply do the above but with only tightening your abdominal muscles and holding it as directed.  ?? Hip-Lift. Lie on your back with your knees flexed 90 degrees. Push down with your feet and shoulders as you raise your hips a couple inches off the floor; hold for 10 seconds, repeat 5 to 10 times.  ?? Back arches. Lie on your stomach, propping yourself up on bent elbows. Slowly press on your hands, causing an arch in your low back. Repeat 3 to 5 times. Any initial stiffness and discomfort should lessen with repetition over time.  ?? Shoulder-Lifts. Lie face down with arms beside your body. Keep hips and torso pressed to floor as you slowly lift your head and shoulders off the floor.  Do not overdo your exercises, especially in the beginning. Exercises may cause you some mild back discomfort which lasts for a few minutes; however, if the pain is more severe, or lasts for more than 15 minutes, do not continue  exercises until you see your caregiver. Improvement with exercise therapy for back problems is slow.   See your caregivers for assistance with developing a proper back exercise program.  Document Released: 02/04/2004 Document Revised: 03/21/2011 Document Reviewed: 10/28/2010  ExitCare?? Patient Information ??2014 [employer]

## 2012-11-20 NOTE — Unmapped (Signed)
Subjective  HPI:   Patient ID: Sarah Huang is a 57 y.o. female.    Chief Complaint:  Flank Pain  Pertinent negatives include no abdominal pain, dysuria, fever, headaches, numbness or weakness.        C/o dizzy spell, more lightheaded sensation.  Started this AM when woke up, has been going on all day.  Sometimes feels like going to pass out, sometimes feels like going to throw up.  If doing something , feels better. Worse when sitting and goes to stand up.  EAting ok.  No ear pain.  Thought may be due to allerigies, mold count high.  Takes imitrex for headaches.  TAkes antihistamine, whatever is cheapest over the counter.  States kidneys hurting her, pain in lower back bilat. Wakes at night with discomfort, hurts to change positions.  Present for months. Chronic microscopic hematuria, had workup with no cause found.  No dysuria, nocturia two to three times nightly.  Only seems to urinate a little bit. This only occurs at night. Chronic.  Spot on foot.  Stopped taking crestor, was causing muscle pain. Had similar pain with zocor.      Medications:  Current Outpatient Prescriptions   Medication Sig Dispense Refill   ??? alendronate (FOSAMAX) 70 MG tablet Take 1 tablet (70 mg total) by mouth every 7 days.  12 tablet  3   ??? cetirizine (ZYRTEC) 10 MG tablet Take 10 mg by mouth daily.       ??? omeprazole (PRILOSEC) 40 MG capsule TAKE 1 CAPSULE BY MOUTH DAILY  30 capsule  5   ??? ergocalciferol (ERGOCALCIFEROL) 50,000 unit capsule Take 1 capsule (50,000 Units total) by mouth once a week.  12 capsule  0   ??? rosuvastatin (CRESTOR) 5 MG tablet Take 1 tablet (5 mg total) by mouth daily.  30 tablet  5   ??? SUMAtriptan (IMITREX) 100 MG tablet Take one tablet po daily as needed for migraine, may repeat in 2 hours if needed in 24 hours.  12 tablet  2     No current facility-administered medications for this visit.        ROS:   Review of Systems   Constitutional: Positive for fatigue. Negative for fever, chills, appetite change  and unexpected weight change.   HENT: Positive for congestion and rhinorrhea. Negative for ear pain, sore throat and tinnitus.    Eyes: Negative for visual disturbance.   Respiratory: Negative for cough and shortness of breath.    Gastrointestinal: Positive for nausea and constipation (chronic). Negative for vomiting, abdominal pain and diarrhea.   Genitourinary: Positive for flank pain. Negative for dysuria, urgency, frequency, vaginal bleeding and vaginal discharge.   Musculoskeletal: Negative for neck pain and neck stiffness.   Neurological: Positive for light-headedness. Negative for dizziness, tremors, seizures, syncope, speech difficulty, weakness, numbness and headaches.   All other systems reviewed and are negative.           Objective:   Physical Exam   Vitals reviewed.  Constitutional: She is oriented to person, place, and time. She appears well-developed and well-nourished. No distress.   No orthostatic BP change.   HENT:   Right Ear: Tympanic membrane, external ear and ear canal normal.   Left Ear: Tympanic membrane, external ear and ear canal normal.   Mouth/Throat: Oropharynx is clear and moist. No oropharyngeal exudate.   Eyes: Conjunctivae and EOM are normal. Pupils are equal, round, and reactive to light. Right eye exhibits no discharge. Left eye exhibits  no discharge.   Neck: Normal range of motion. Neck supple. No thyromegaly present.   Cardiovascular: Normal rate, regular rhythm and normal heart sounds.  Exam reveals no gallop and no friction rub.    No murmur heard.  Pulmonary/Chest: Effort normal and breath sounds normal. She has no wheezes. She has no rales.   Abdominal: Soft. Bowel sounds are normal. She exhibits no distension and no mass. There is no tenderness. There is no rebound and no guarding.   Musculoskeletal: She exhibits no edema.   Lymphadenopathy:     She has no cervical adenopathy.   Neurological: She is alert and oriented to person, place, and time. She has normal reflexes. No  cranial nerve deficit.   Skin: No rash noted. She is not diaphoretic.   Macular erythematous patch medial mid foot, right, some scaling at edges, 1 x 2 cm.     Psychiatric: Her behavior is normal. Judgment and thought content normal.   Depressed affect.             Filed Vitals:    11/20/12 1700   BP: 114/80   Pulse: 88   Weight: 205 lb (92.987 kg)     Body mass index is 36.32 kg/(m^2).  There is no height on file to calculate BSA.                Assessment/Plan:     Patient Active Problem List   Diagnosis   ??? Esophageal reflux   ??? Elevated blood pressure reading without diagnosis of hypertension   ??? Calculus of kidney   ??? Plantar fascial fibromatosis   ??? Insomnia, unspecified   ??? Family history of diabetes mellitus   ??? Osteoporosis, unspecified   ??? Postablative ovarian failure   ??? Fatty liver   ??? Weight gain   ??? Hypercholesteremia- pt unable to tolerate statin. Consider trial of pravachol, has had side effects to simvastatin, atorvastatin. Will address next visit.   ??? Urinary retention with incomplete bladder emptying- persistent symptoms, has had GU eval with no benefit for symptoms per pt.  Recommended follow up with GU for persistent symptoms.   ??? Hearing loss   ??? Trochanteric bursitis of left hip   ??? SUI (stress urinary incontinence, female)   ??? Post-void dribbling   ??? Microscopic hematuria   ??? Flank pain   ??? Fibromyalgia   ??? Rectal bleeding     Lightheadedness, nausea, off balance sensation- may be viral URI, though pt relates has been there off and on for some time. Has been given meclizine to try in past. Does not think has had eval by ENT before.  Recommend rest, fluids.  If persistent pt advised to call , would then refer to ENT.  Nml exam today.    Rash , foot- likely Athlete's foot, recommend topical treatment with lotrimin cream.    Back pain- lower bilat back, not really flank pain. Suspect muscular as opposed to kidney pain.  REcommend back exercises, stretches. May benefit from PT referral if this is  not helpful.

## 2012-12-07 MED ORDER — acyclovir (ZOVIRAX) 5 % cream
5 | TOPICAL | Status: AC
Start: 2012-12-07 — End: 2014-01-06

## 2012-12-07 NOTE — Unmapped (Signed)
Lips are broken out in fever blisters;wants rx for zovirax.

## 2012-12-07 NOTE — Unmapped (Signed)
Rx for zovirax cream sent to pharm, pt notified.

## 2013-01-30 MED ORDER — omeprazole (PRILOSEC) 40 MG capsule
40 | ORAL_CAPSULE | ORAL | Status: AC
Start: 2013-01-30 — End: 2014-01-20

## 2013-01-30 NOTE — Unmapped (Signed)
Last refill 07-11-12  Last OV 11-20-12

## 2013-03-12 MED ORDER — alendronate (FOSAMAX) 70 MG tablet
70 | ORAL_TABLET | ORAL | 1.00 refills | 90.00000 days | Status: AC
Start: 2013-03-12 — End: 2014-01-20

## 2013-03-12 NOTE — Unmapped (Signed)
Refill request: Aledronate  Last OV: 11.11.14  Last filled: 3.18.14

## 2013-04-02 ENCOUNTER — Ambulatory Visit: Admit: 2013-04-02 | Discharge: 2013-04-02 | Payer: PRIVATE HEALTH INSURANCE

## 2013-04-02 ENCOUNTER — Inpatient Hospital Stay: Admit: 2013-04-02 | Payer: PRIVATE HEALTH INSURANCE

## 2013-04-02 DIAGNOSIS — Z833 Family history of diabetes mellitus: Secondary | ICD-10-CM

## 2013-04-02 DIAGNOSIS — Z0000 Encounter for general adult medical examination without abnormal findings: Secondary | ICD-10-CM

## 2013-04-02 LAB — VITAMIN D 25 HYDROXY: Vit D, 25-Hydroxy: 33.9 ng/mL (ref 30.0–100)

## 2013-04-02 LAB — COMPREHENSIVE METABOLIC PANEL, SERUM
ALT: 16 U/L (ref 7–52)
AST (SGOT): 16 U/L (ref 13–39)
Albumin: 4.5 g/dL (ref 3.5–5.7)
Alkaline Phosphatase: 60 U/L (ref 34–104)
Anion Gap: 7 mmol/L (ref 3–16)
BUN: 15 mg/dL (ref 7–25)
CO2: 30 mmol/L (ref 21–31)
Calcium: 9.5 mg/dL (ref 8.6–10.3)
Chloride: 105 mmol/L (ref 98–110)
Creatinine: 0.62 mg/dL (ref 0.60–1.30)
GFR MDRD Af Amer: 120 See note.
GFR MDRD Non Af Amer: 99 See note.
Glucose: 94 mg/dL (ref 70–100)
Osmolality, Calculated: 295 mOsm/kg (ref 278–305)
Potassium: 4.5 mmol/L (ref 3.5–5.3)
Sodium: 142 mmol/L (ref 133–146)
Total Bilirubin: 0.4 mg/dL (ref 0.3–1.2)
Total Protein: 7.4 g/dL (ref 6.4–8.9)

## 2013-04-02 LAB — LIPID PANEL
Cholesterol, Total: 263 mg/dL (ref 0–200)
HDL: 45 mg/dL (ref 23–92)
LDL Cholesterol: 178 mg/dL (ref 0–100)
Triglycerides: 199 mg/dL (ref 48–352)

## 2013-04-02 LAB — TSH: TSH: 1.36 u[IU]/mL (ref 0.34–5.60)

## 2013-04-02 LAB — MAGNESIUM: Magnesium: 2 mg/dL (ref 1.9–2.7)

## 2013-04-02 LAB — HEMOGLOBIN A1C: Hemoglobin A1C: 5.4 % (ref 4.8–6.4)

## 2013-04-02 NOTE — Unmapped (Signed)
Subjective  HPI:   Patient ID: Sarah Huang is a 58 y.o. female.    Chief Complaint:  HPI     Here for annual exam.  Chart reviewed in advance in preparation for this visit.  Pt states in past month has woken with regurgitation.  No heartburn. Taking prilosec once daily.  Feels hungry all the time. Craves pasta.  Requests appetite suppressant.    Off crestor due to muscle aching.  Had similar problem with other statins.  Taking fish oil.        Medications:  Current Outpatient Prescriptions   Medication Sig Dispense Refill   ??? acyclovir (ZOVIRAX) 5 % cream Apply topically 5 times daily as needed.  15 g  1   ??? alendronate (FOSAMAX) 70 MG tablet TAKE 1 TABLET BY MOUTH EVERY 7 DAYS  12 tablet  3   ??? cetirizine (ZYRTEC) 10 MG tablet Take 10 mg by mouth daily.       ??? ergocalciferol (ERGOCALCIFEROL) 50,000 unit capsule Take 1 capsule (50,000 Units total) by mouth once a week.  12 capsule  0   ??? glucosamine-chondroitin 500-400 mg tablet Take 1 tablet by mouth 3 times a day.       ??? magnesium 250 mg Tab Take by mouth.       ??? montelukast (SINGULAIR) 10 mg tablet Take 1 tablet (10 mg total) by mouth at bedtime.  30 tablet  3   ??? omega-3 fatty acids-fish oil (FISH OIL) 360-1,200 mg Cap Take 1 capsule (1,200 mg total) by mouth daily.  30 capsule  5   ??? omeprazole (PRILOSEC) 40 MG capsule TAKE ONE CAPSULE BY MOUTH DAILY  30 capsule  11   ??? SUMAtriptan (IMITREX) 100 MG tablet Take one tablet po daily as needed for migraine, may repeat in 2 hours if needed in 24 hours.  12 tablet  2   ???    30 tablet  5     No current facility-administered medications for this visit.        ROS:   Review of Systems   Constitutional: Positive for weight gain. Negative for fever and chills.   HENT: Positive for hearing loss.         Frequent cold sores.   Eyes: Positive for visual disturbance (s/p lasik, some blurry vision).   Respiratory: Positive for shortness of breath (stable with exertion). Negative for cough and wheezing.         Sleep  apnea, can't wear CPAP   Cardiovascular: Positive for chest pain. Negative for palpitations.   Gastrointestinal: Negative for heartburn, nausea, vomiting, abdominal pain, diarrhea and constipation.        Reflux   Neurological: Positive for headaches (migraine h/a, related to allergies.).   All other systems reviewed and are negative.           Objective:   Physical Exam   Vitals reviewed.  Constitutional: She is oriented to person, place, and time. She appears well-developed and well-nourished. No distress.   HENT:   Head: Normocephalic and atraumatic.   Right Ear: Hearing, tympanic membrane, external ear and ear canal normal.   Left Ear: Hearing, tympanic membrane, external ear and ear canal normal.   Nose: Nose normal.   Mouth/Throat: Oropharynx is clear and moist and mucous membranes are normal. No oropharyngeal exudate.   Eyes: Conjunctivae and EOM are normal. Pupils are equal, round, and reactive to light. Right eye exhibits no discharge. Left eye exhibits no discharge. No  scleral icterus.   Neck: Normal range of motion. Neck supple. No thyromegaly present.   Cardiovascular: Normal rate, regular rhythm, normal heart sounds and intact distal pulses.  Exam reveals no gallop and no friction rub.    No murmur heard.  Pulmonary/Chest: Effort normal and breath sounds normal. She has no wheezes. She has no rales.   Abdominal: Bowel sounds are normal. She exhibits no distension and no mass. There is no hepatosplenomegaly. There is no tenderness. There is no rebound and no guarding.   Genitourinary:   Breasts without masses bilaterally.   Musculoskeletal: Normal range of motion. She exhibits no edema.   Lymphadenopathy:     She has no cervical adenopathy.   Neurological: She is alert and oriented to person, place, and time. She has normal reflexes.   Skin: Skin is warm and dry. No rash noted. She is not diaphoretic. No erythema.   Psychiatric: She has a normal mood and affect. Her behavior is normal. Judgment and  thought content normal.             Filed Vitals:    04/02/13 0937   BP: 120/80   Pulse: 84   Height: 5' 3 (1.6 m)   Weight: 202 lb (91.627 kg)     Body mass index is 35.79 kg/(m^2).  Body surface area is 2.02 meters squared.                Assessment/Plan:     Patient Active Problem List   Diagnosis   ??? Esophageal reflux- some increased reflux. Discussed nonpharmacologic methods , not eating late, avoiding triggers. conitnue omeprazole.   ??? Elevated blood pressure reading without diagnosis of hypertension- nml BP today.   ??? Calculus of kidney   ??? Plantar fascial fibromatosis   ??? Insomnia, unspecified   ??? Family history of diabetes mellitus   ??? Osteoporosis, unspecified- DEXA scan improved. Continue fosamax, repeat 2 yrs.   ??? Postablative ovarian failure   ??? Fatty liver   ??? Weight gain- check TSH. Discussed diet and exercise. Advised pt I do not recommend weight loss med. Information given on 1500 cal exchange diet.   ??? Hypercholesteremia- check lipid panel. Pt intolerant of statins, crestor, lipitor , zocor. Consider trial of pravachol. Check labs.   ??? Urinary retention with incomplete bladder emptying   ??? Hearing loss   ??? Trochanteric bursitis of left hip   ??? SUI (stress urinary incontinence, female)   ??? Post-void dribbling   ??? Microscopic hematuria - neg evaluation.   ??? Fibromyalgia

## 2013-04-08 MED ORDER — atorvastatin (LIPITOR) 40 MG tablet
40 | ORAL_TABLET | Freq: Every day | ORAL | Status: AC
Start: 2013-04-08 — End: 2013-09-20

## 2013-04-10 MED ORDER — VITAMIN D2 50,000 unit capsule
1250 | ORAL_CAPSULE | ORAL | 0.00 refills | 30.00000 days | Status: AC
Start: 2013-04-10 — End: 2013-06-11

## 2013-04-18 ENCOUNTER — Inpatient Hospital Stay: Admit: 2013-04-18 | Payer: PRIVATE HEALTH INSURANCE

## 2013-04-18 DIAGNOSIS — Z1231 Encounter for screening mammogram for malignant neoplasm of breast: Secondary | ICD-10-CM

## 2013-04-23 ENCOUNTER — Inpatient Hospital Stay: Admit: 2013-04-23 | Payer: PRIVATE HEALTH INSURANCE | Attending: Diagnostic Radiology

## 2013-04-23 DIAGNOSIS — N63 Unspecified lump in unspecified breast: Secondary | ICD-10-CM

## 2013-05-03 ENCOUNTER — Inpatient Hospital Stay: Admit: 2013-05-03 | Payer: PRIVATE HEALTH INSURANCE

## 2013-05-03 ENCOUNTER — Ambulatory Visit: Admit: 2013-05-03 | Discharge: 2013-05-03 | Payer: PRIVATE HEALTH INSURANCE

## 2013-05-03 DIAGNOSIS — J111 Influenza due to unidentified influenza virus with other respiratory manifestations: Secondary | ICD-10-CM

## 2013-05-03 LAB — INFLUENZA A AND B ASSAY, NAA
Influenza A - H1N1: NEGATIVE
Influenza A: NEGATIVE
Influenza B: POSITIVE

## 2013-05-03 NOTE — Unmapped (Signed)
Called Dr. Dessa Phi--  Called pt --notified --Told pt to keep hydrated and get plenty of rest

## 2013-05-03 NOTE — Unmapped (Signed)
Natural cold remedies  Cold Remedy #1: Drink plenty of fluids to help break up your congestion. Drinking water or juice will prevent dehydration and keep your throat moist. You should drink at least 8 to 10 eight-ounce glasses of water daily. Include fluids such as water, sports drinks, herbal teas, fruit drinks, or ginger ale. Your mother's chicken soup might help too! (Limit cola, coffee, and other drinks with caffeine because it acts like a diuretic and may dehydrate you.)  Cold Remedy #2: Inhale steam to ease your congestion and drippy nose. Hold your head over a pot of boiling water and breathe through your nose. Be careful. If the steam burns your nose, breathe in more slowly. You can buy a humidifier, but the steam will be the same as the water on the stove. Moisture from a hot shower with the door closed, saline nasal spray, or a room humidifier is just as helpful to ease congestion.  Cold Remedy #3: Blow your nose often, but do it the proper way. It's important to blow your nose regularly when you have a cold rather than sniffling mucus back into your head. But when you blow hard, pressure can carry germ-carrying phlegm back into your ear passages, causing earache. The best way to blow your nose is to press a finger over one nostril while you blow gently to clear the other.  Cold Remedy #4: Use saline nasal sprays or make your own salt water rinse to irrigate your nose. Salt-water rinsing helps break nasal congestion while also removing virus particles and bacteria from your nose. Here's a popular recipe:  Mix 1/4 teaspoon salt and 1/4 teaspoon baking soda in 8 ounces of warm water. Fill a bulb syringe with this mixture (or use a Neti pot, available at most health foods stores). Lean your head over a basin, and using the bulb syringe, gently squirt the salt water into your nose. Hold one nostril closed by applying light finger pressure while squirting the salt mixture into the other nostril. Let it drain.  Repeat two to three times, and then treat the other nostril.  To avoid exposing yourself to other bacteria and infections, it's important to watch what you put in your nose. According to the CDC, if you are irrigating, flushing, or rinsing your sinuses, use distilled, sterile, or previously boiled water to make up the irrigation solution. It???s also important to rinse the irrigation device after each use and leave open to air dry.  Cold Remedy #5: Stay warm and rested. Staying warm and resting when you first come down with a cold or the flu helps your body direct its energy toward the immune battle. This battle taxes the body. So give it a little help by lying down under a blanket to stay warm if necessary.    Cold Remedy #6: Gargle with warm salt water. Gargling can moisten a sore or scratchy throat and bring temporary relief. Try a half teaspoon of salt dissolved in 8 ounces of warm water four times daily. To reduce the tickle in your throat, try an astringent gargle -- such as tea that contains tannin -- to tighten the membranes. Or use a thick, viscous gargle made with honey, popular in folk medicine. Steep one tablespoon of raspberry leaves or lemon juice in two cups of hot water; mix with one teaspoon of honey. Let the mixture cool to room temperature before gargling.  Cold Remedy #7: Drink hot liquids. Hot liquids relieve nasal congestion, prevent dehydration, and soothe the   uncomfortably inflamed membranes that line your nose and throat. If you're so congested you can't sleep at night, try a hot toddy, an age-old remedy. Make a cup of hot herbal tea. Add one teaspoon of honey and one small shot (about 1 ounce) of whiskey or bourbon if you wish. Limit yourself to one. Too much alcohol inflames those membranes and is counterproductive.  Cold Remedy #8: Take a steamy shower. Steamy showers moisturize your nasal passages and relax you. If you're dizzy from the flu, run a steamy shower while you sit on a chair  nearby and take a sponge bath.  Cold Remedy #9: Try a small dab of mentholated salve under your nose to help open breathing passages and help restore the irritated skin at the base of the nose. Menthol, eucalyptus, and camphor all have mild numbing ingredients that may help relieve the pain of a nose rubbed raw.  Cold Remedy #10: Apply hot packs around your congested sinuses. You can buy reusable hot packs at a drugstore. Or make your own. Take a damp washcloth and heat it for 30 seconds in a microwave. (Test the temperature first to make sure it's right for you.)  Cold Remedy #11: Sleep with an extra pillow under your head. This will help relieve congested nasal passages. If the angle is too awkward, try placing the pillows between the mattress and the box springs to create a more gradual slope.  Cold Remedy #12: Learn about natural remedies like zinc, echinacea, and vitamin C. People looking for natural cold remedies often turn to supplements. Many of these remedies have not been shown to help and some hurt.  Zinc: While early studies showed that zinc could help fight off a cold more quickly, the latest consensus seems to be that zinc has a minimal benefit at best. According to the Food and Drug Administration, zinc nasal spray can cause permanent loss of smell.

## 2013-05-03 NOTE — Unmapped (Signed)
C/c  Sick x 2 wks  Initially thought it was allergies -- takes meds daily.  Was feeling a little better.  Then worse over past 5 days.    +fevers. On going. +chills.  Took acetaminophen this AM.     Patient Active Problem List   Diagnosis   ??? Esophageal reflux   ??? Elevated blood pressure reading without diagnosis of hypertension   ??? Calculus of kidney   ??? Plantar fascial fibromatosis   ??? Insomnia, unspecified   ??? Family history of diabetes mellitus   ??? Osteoporosis, unspecified   ??? Postablative ovarian failure   ??? Fatty liver   ??? Weight gain   ??? Hypercholesteremia   ??? Urinary retention with incomplete bladder emptying   ??? Hearing loss   ??? Trochanteric bursitis of left hip   ??? SUI (stress urinary incontinence, female)   ??? Post-void dribbling   ??? Microscopic hematuria   ??? Fibromyalgia       Current Outpatient Prescriptions on File Prior to Visit   Medication Sig Dispense Refill   ??? acyclovir (ZOVIRAX) 5 % cream Apply topically 5 times daily as needed.  15 g  1   ??? alendronate (FOSAMAX) 70 MG tablet TAKE 1 TABLET BY MOUTH EVERY 7 DAYS  12 tablet  3   ??? atorvastatin (LIPITOR) 40 MG tablet Take 1 tablet (40 mg total) by mouth daily.  30 tablet  5   ??? cetirizine (ZYRTEC) 10 MG tablet Take 10 mg by mouth daily.       ??? glucosamine-chondroitin 500-400 mg tablet Take 1 tablet by mouth 3 times a day.       ??? magnesium 250 mg Tab Take by mouth.       ??? montelukast (SINGULAIR) 10 mg tablet Take 1 tablet (10 mg total) by mouth at bedtime.  30 tablet  3   ??? omega-3 fatty acids-fish oil (FISH OIL) 360-1,200 mg Cap Take 1 capsule (1,200 mg total) by mouth daily.  30 capsule  5   ??? omeprazole (PRILOSEC) 40 MG capsule TAKE ONE CAPSULE BY MOUTH DAILY  30 capsule  11   ??? SUMAtriptan (IMITREX) 100 MG tablet Take one tablet po daily as needed for migraine, may repeat in 2 hours if needed in 24 hours.  12 tablet  2   ??? VITAMIN D2 50,000 unit capsule TAKE 1 CAPSULE BY MOUTH WEEKLY  12 capsule  3     No current facility-administered  medications on file prior to visit.       ROS: all negative except what is outlined in HPI.    BP 114/68   Pulse 80   Temp(Src) 98.8 ??F (37.1 ??C) (Oral)   Wt 204 lb (92.534 kg)   BMI 36.15 kg/m2    PE  gen well appearing, lungs cta, heart rrr, ears grey tm, mouth no erythema, cervical LN +palpable, mild sinus tenderness      A/p  Problem List Items Addressed This Visit    None      Visit Diagnoses    Flu syndrome    -  Primary     Relevant Orders        Influenza A+B PCR       if can not do sinus rinse/flonase, then steam.       Lovett Sox MD  General Internal Medicine  [facility]

## 2013-05-03 NOTE — Unmapped (Signed)
[facility] lab called for Dr Azell Der with a critical result for pt; pt is positive for: Influenza PCR and Influenza B.

## 2013-05-10 ENCOUNTER — Inpatient Hospital Stay: Admit: 2013-05-10 | Payer: PRIVATE HEALTH INSURANCE

## 2013-05-10 DIAGNOSIS — R928 Other abnormal and inconclusive findings on diagnostic imaging of breast: Secondary | ICD-10-CM

## 2013-05-10 DIAGNOSIS — N63 Unspecified lump in unspecified breast: Secondary | ICD-10-CM

## 2013-05-16 NOTE — Unmapped (Signed)
Sarah Huang is a 58 year old female who went for routine screening mammogram in early April which with suspicious finding in both breasts. Findings from the left breast revealed a cyst which was aspirated. She proceeded to undergo stereotactic biopsies of 2 areas of the right breast. Results from the right breast mass in the 12:00 o'clock position revealed invasive ductal carcinoma. The second biopsy from and area of  right breast density did reveal  low grade ductal carcinoma in situ.      She has a family history of breast cancer in her paternal aunt and cousin.

## 2013-05-16 NOTE — Unmapped (Signed)
Referred by/Date of Referral: 05/15/13 Radiology Dr. Shirline Frees  Introduction to Navigator: yes  Explanation of Navigator Role: yes  Physical Needs: None at this time.  Psychological Needs: Indicated she is nervous, but taking the out of sight, out of mind approach.  Reported that her daughter is already looking up information and changing her diet and habits.  Social Needs (Transportation/Family Support): No issues at this time. 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  She has 2 children and is one of 14 children herself (7 still alive).  Reported she is the first person in her family that she knows of that has been diagnosed with cancer.  Reports she had a hysterectomy in 2000.  Education Needs:  Will need all standard education material. Let her know she would get a binder and any appropriate handouts for surgery at her visit.  Coordination/Appointment needs: Dr. Melvyn Neth New patient consult 05/17/13@[facility]   Discussed with this patient my role and what to expect at her first visit with Dr. Melvyn Neth.  Explained to patient that I would not meet her in person at the initial visit at [facility], but would plan to see her at visits at [facility].  Answered all questions.  Patient thanked me for my time. Took my name and phone number.  Instructed her to call with any follow up questions from her visit with Dr. Melvyn Neth.

## 2013-05-17 ENCOUNTER — Ambulatory Visit: Admit: 2013-05-17 | Discharge: 2013-05-17 | Payer: PRIVATE HEALTH INSURANCE

## 2013-05-17 DIAGNOSIS — C50919 Malignant neoplasm of unspecified site of unspecified female breast: Secondary | ICD-10-CM

## 2013-05-17 NOTE — Unmapped (Signed)
You are being referred to Dr. Thurnell Lose, Plastic Surgeon.  The office will contact you withing 1-2 days for an appointment. If you have any questions or concerns, please contact his assistant Kandis Mannan at [phone],      The office has faxed over a referral to the genetic counselors. Their office will call you for an appointment.  If you do not hear from them within 3days please call their office to schedule your consultation.  Phone# [phone] option 1      Dennie Fetters  Administrative Assistant  Lucille Passy, MD  Silva Bandy, MD  Main Office Ph# [phone]  Line to leave voice message: [phone]  Fax# [phone]    Serita Butcher, RN  Office phone# [phone]  After Hours phone #[phone]    Hours of Operation  Monday through Friday  8 am to 5 pm

## 2013-05-17 NOTE — Unmapped (Signed)
History of Present Illness:   Sarah Huang is a 58 y.o. female who presents due to newly diagnosed right breast cancer. She presented for screening mammography in early April where several changes were noted in both breasts. 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 The left breast lesion turned out to be a cyst which was aspirated and resolved.    She denies and breast changes, symptoms, or other concerns.    HPI Breast History:   Race: caucasian  Ethnicity: No Ashkenazi [place], [place], or Amish ancestry  Age of Menarche:15  LMP: December 2002  Gravida  3 Para 2  Misc 1  Age at first childbirth:21  History of breast feeding: no  History of hormonal contraceptives:yes about 4 years  Age of Menopause:  Total hysterectomy at 46  History of HRT: yes, 3 months premarin  Assisted Reproduction Treatments: no  Hereditary risk factors (pre-breast biopsy): no  Prior history of breast biopsy: 05-10-13  Family history of breast or ovarian cancer: yes, paternal aunt diagnosed with breast cancer age of diagnosis unknown, paternal cousin diagnosed with breast cancer at age 4  Family history of other cancers: yes, maternal aunt diagnosed with colon cancer at age 73, maternal aunt diagnosed with pancreatic cancer at age 20  Patient interested in future childbearing:no  Patient interested in meeting with a fertility specialist: no  Comments:           Histories:     She has a past medical history of Hyperlipidemia; Kidney stones; Depression; and Dyslipidemia.    She has past surgical history that includes Tonsillectomy; Salpingoophorectomy (2004); Sinus surgery; mid urethral sling  (2002); Esophagogastroduodenoscopy (N/A, 05/29/2012); Colonoscopy (N/A, 05/29/2012); Colonoscopy (N/A, 06/27/2012); and Hysterectomy (2004).    Her family history includes Breast cancer in her other, other, and paternal aunt; COPD in her brother, father, mother,  other, and sister; Coronary artery disease in her father and mother; Diabetes in her mother; Emphysema in her father; Heart attack in her brother; Heart disease in her brother and mother; Heart failure in her father; Hyperlipidemia in her father and mother; Hypertension in her mother; Other in her brother and sister.    She reports that she has quit smoking. She has never used smokeless tobacco. She reports that she does not drink alcohol or use illicit drugs.      Review of Systems   Constitutional: Positive for appetite change. Negative for fever, weight loss, diaphoresis, activity change and fatigue.   HENT: Positive for postnasal drip and sinus pressure. Negative for congestion and sore throat.    Respiratory: Negative for cough, chest tightness and shortness of breath.    Cardiovascular: Negative for chest pain and palpitations.   Gastrointestinal: Negative for nausea, vomiting, abdominal pain and constipation.   Genitourinary: Positive for hematuria. Negative for dysuria, urgency, frequency, decreased urine volume and difficulty urinating.   Musculoskeletal: Negative for arthralgias, back pain and neck pain.   Skin: Positive for wound (right breast biopsy site ecchymosis).   Neurological: Positive for light-headedness and headaches (allergies ). Negative for dizziness and weakness.   Psychiatric/Behavioral: Negative for depression, suicidal ideas and dysphoric mood. The patient is not nervous/anxious.        Allergies:   Crestor and No known allergies    Medications:     Outpatient Encounter Prescriptions as of 05/17/2013   Medication Sig Dispense Refill   ??? acyclovir (ZOVIRAX) 5 % cream Apply topically  5 times daily as needed.  15 g  1   ??? alendronate (FOSAMAX) 70 MG tablet TAKE 1 TABLET BY MOUTH EVERY 7 DAYS  12 tablet  3   ??? atorvastatin (LIPITOR) 40 MG tablet Take 1 tablet (40 mg total) by mouth daily.  30 tablet  5   ??? cetirizine (ZYRTEC) 10 MG tablet Take 10 mg by mouth daily.       ???  glucosamine-chondroitin 500-400 mg tablet Take 1 tablet by mouth 3 times a day.       ??? magnesium 250 mg Tab Take by mouth.       ??? montelukast (SINGULAIR) 10 mg tablet Take 1 tablet (10 mg total) by mouth at bedtime.  30 tablet  3   ??? omega-3 fatty acids-fish oil (FISH OIL) 360-1,200 mg Cap Take 1 capsule (1,200 mg total) by mouth daily.  30 capsule  5   ??? omeprazole (PRILOSEC) 40 MG capsule TAKE ONE CAPSULE BY MOUTH DAILY  30 capsule  11   ??? SUMAtriptan (IMITREX) 100 MG tablet Take one tablet po daily as needed for migraine, may repeat in 2 hours if needed in 24 hours.  12 tablet  2   ??? VITAMIN D2 50,000 unit capsule TAKE 1 CAPSULE BY MOUTH WEEKLY  12 capsule  3     No facility-administered encounter medications on file as of 05/17/2013.        Objective:       Blood pressure 137/60, pulse 82, temperature 96.9 ??F (36.1 ??C), temperature source Oral, resp. rate 16, height 5' 3.3 (1.608 m), weight 200 lb (90.719 kg), SpO2 97.00%.    Physical Exam   Nursing note and vitals reviewed.  Constitutional: She is oriented to person, place, and time. She appears well-developed and well-nourished.   HENT:   Head: Normocephalic and atraumatic.   Eyes: Conjunctivae are normal. Pupils are equal, round, and reactive to light.   Neck: Normal range of motion. Neck supple.   Cardiovascular: Normal rate and regular rhythm.    Pulmonary/Chest: Effort normal and breath sounds normal. Right breast exhibits tenderness (Diffuse). Right breast exhibits no inverted nipple and no nipple discharge. Left breast exhibits no inverted nipple, no mass, no nipple discharge, no skin change and no tenderness. Breasts are symmetrical.       Abdominal: Soft. She exhibits no distension.   Musculoskeletal: Normal range of motion. She exhibits no edema and no tenderness.   Lymphadenopathy:     She has no cervical adenopathy.     She has no axillary adenopathy.        Right: No supraclavicular adenopathy present.        Left: No supraclavicular adenopathy  present.   Neurological: She is alert and oriented to person, place, and time.   Skin: Skin is warm and dry.   Psychiatric: She has a normal mood and affect. Her behavior is normal. Judgment and thought content normal.       Review of Lab Results:      Lab Results   Component Value Date    WBC 5.9 03/27/2012    HGB 13.1 03/27/2012    HCT 40.0 03/27/2012    PLT 254 03/27/2012    GLUCOSE 94 04/02/2013    GLUCOSE 86 10/08/2010    CREATININE 0.62 04/02/2013    NA 142 04/02/2013    K 4.5 04/02/2013    CL 105 04/02/2013    CO2 30 04/02/2013    GFRNONAFRAM 107 04/13/2011  BILITOT 0.4 04/02/2013    PROT 7.4 04/02/2013    PROT 7.2 09/20/2005    AST 18 04/13/2011    ALT 16 04/02/2013    ALKPHOS 60 04/02/2013       Imaging:       Pathology:       Cancer Staging:   Breast cancer    Primary site: Breast (Right)    Staging method: AJCC 7th Edition    Clinical: Stage IA (T1b, N0, cM0) signed by Cristal Generous, MD on 05/17/2013  9:23 AM    Summary: Stage IA (T1b, N0, cM0)       Assessment:   58 y.o. 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 Options for surgical treatment of the breast primary:  In general options include breast conservation or mastectomy. She is not an ideal candidate for lumpectomy due to the multicentric nature of the cancer.    She had already elected to proceed with right mastectomy and is considering contralateral prophylactic mastectomy. We discussed the pros and cons and will await genetics evaluation.    2. Lymph node evaluation: will require sentinel lymph node biopsy at the time of surgery    3. Systemic therapy:  We spoke briefly about adjuvant therapies which may include chemotherapy, immunotherapy, and/or hormonal therapy.   -she may chemotherapy   -she will require hormonal therapy.  -she does not want a pre-operative referral to medical oncology.    4. Radiation  therapy:  -she will require radiation therapy after breast conservation.  -she may require post-mastectomy radiation therapy.  -she does not want a pre-operative referral to radiation oncology.     5. Plastic and reconstructive surgery:  -she is interested mastectomy and may be a candidate for breast reconstruction.  -she does want a referral to plastic and reconstructive surgery. A referral has been sent to Dr. Jens Som.    6. Staging studies are not indicated at this time.     7. Imaging - her imaging and reports have been reviewed. Will not proceed with MRI as she is planning mastectomy.    8. Genetics   -she does want a referral to genetic counseling. A referral has been sent.    9. Fertility   - pt is post-menopausal.    10. Multidisciplinary breast conference - we will submit her case for review after surgery.    11. Follow up - after consultation with plastics and genetics, plan surgery in 4-6 weeks to allow for genetics evaluation

## 2013-06-04 ENCOUNTER — Ambulatory Visit: Admit: 2013-06-04 | Payer: PRIVATE HEALTH INSURANCE

## 2013-06-04 DIAGNOSIS — C50919 Malignant neoplasm of unspecified site of unspecified female breast: Secondary | ICD-10-CM

## 2013-06-07 NOTE — Unmapped (Signed)
Contacted patient to see how her visit with Dr. Jens Som went. She stated that she has decided to undergo tissue expander placement and not autologous reconstruction. I informed her that we are holding June the 10th for her procedure and would be in touch with more details next week. She states that she did give the plastic surgery office her FMLA papers and they were willing to complete and send in. Questions were addressed and the patient was appreciative of the call.

## 2013-06-10 NOTE — Unmapped (Signed)
Spoke with Sarah Huang in Dr. Melvyn Neth' office will request surgery Wednesday June 10 Sarah Huang 10:00am start Bilateral total mastectomy right axillary sentinel lymph node biopsy-(Dr Melvyn Neth)  1:30pm placement of bilateral tissue expanders (Dr. Jens Som)  Sarah Huang will contact her family doctor for pre admission testing.

## 2013-06-10 NOTE — Unmapped (Signed)
PT calling to ask if [facility] has faxed to [facility] paperwork to [facility] [employer].

## 2013-06-10 NOTE — Unmapped (Signed)
Pt is having a pre-op physical with MD on 06/13/13. She is asking [facility] to fax over any needed infor and form to [facility] at [phone]

## 2013-06-10 NOTE — Unmapped (Signed)
I called patient back letting her know we are waiting to hear back from Sarah Huang in Dr Melvyn Neth' office

## 2013-06-11 ENCOUNTER — Ambulatory Visit: Admit: 2013-06-11 | Discharge: 2013-06-11 | Payer: PRIVATE HEALTH INSURANCE

## 2013-06-11 ENCOUNTER — Other Ambulatory Visit: Admit: 2013-06-11 | Payer: PRIVATE HEALTH INSURANCE

## 2013-06-11 DIAGNOSIS — C50911 Malignant neoplasm of unspecified site of right female breast: Secondary | ICD-10-CM

## 2013-06-11 DIAGNOSIS — C50919 Malignant neoplasm of unspecified site of unspecified female breast: Secondary | ICD-10-CM

## 2013-06-11 LAB — RENAL FUNCTION PANEL W/EGFR
Albumin: 4.4 g/dL (ref 3.5–5.7)
Anion Gap: 7 mmol/L (ref 3–16)
BUN: 15 mg/dL (ref 7–25)
CO2: 24 mmol/L (ref 21–33)
Calcium: 10.1 mg/dL (ref 8.6–10.3)
Chloride: 106 mmol/L (ref 98–110)
Creatinine: 0.6 mg/dL (ref 0.60–1.30)
GFR MDRD Af Amer: 125 See note.
GFR MDRD Non Af Amer: 103 See note.
Glucose: 88 mg/dL (ref 70–100)
Osmolality, Calculated: 284 mOsm/kg (ref 278–305)
Phosphorus: 4.9 mg/dL (ref 2.1–4.7)
Potassium: 4.5 mmol/L (ref 3.5–5.3)
Sodium: 137 mmol/L (ref 133–146)

## 2013-06-11 NOTE — Unmapped (Signed)
Pre-Procedure Instructions    We???re pleased that you have chosen [facility] for your upcoming procedure.  The staff serving you is professionally trained to provide the highest quality care.  We encourage you to ask questions and to let the staff know your special needs.  We want your visit to be as comfortable as possible.    Your procedure is scheduled on: Wed. 06-19-13 with Dr. Melvyn Neth and Dr. Jens Som  Please arrive at: 7:30am and check into the Nuclear Medicine Department located on the ground floor (main level)      DO NOT EAT OR DRINK ANYTHING (including gum, mints, water, etc.) after midnight the night before your procedure.  You may brush your teeth and gargle on the morning of surgery, but do not swallow any water.  You may take the following medications with a small sip of water:      Please avoid aspirin or ibuprofen containing products for 1 week prior to surgery.  Please notify the surgeon or nurse if you are taking any blood thinners for any reason as there may be special instructions regarding these medications.      Please make transportation arrangements and bring a responsible adult to accompany you home and remain with you for 24 hours.    Leave valuables (money, jewelry, credit cards) at home.  If you wear glasses or contacts, bring a case for safekeeping.    Wear casual, loose fitting, and comfortable clothing.  A gown will be provided.  If you are staying overnight, bring a small overnight bag.  (Storage space is limited.)    Please remove all makeup, jewelry, body piercings, powder, perfume, and nail polish before you arrive.    Bring a list of your medications and dose including herbal and over-the-counter medications.  Do not bring any pills or medications to the hospital. (Exception: transplant patients.)    Bring a photo ID and your insurance card so we can bill your insurance company directly.    Please do not bring any children under the age of 30 to the hospital.    Do not  shave in the area of the surgery for 2 days prior to surgery.  If needed, a trained staff member will clip the area immediately before your surgery.    Quit smoking as far in advance of surgery as possible.  Patients who quit at least 30 days before surgery may have better outcomes.    If you are diabetic, pay close attention to your blood sugar and try to keep it in the range your doctor wants it to be in.  If you have low blood sugar prior to coming in to the hospital you may drink a small amount of CLEAR SUGAR-CONTAINING FLUIDS WITHOUT PULP SUCH AS APPLE JUICE OR CLEAR SODA.  Depending on the procedure you are having this could impact the timing of your surgery.    Discuss discontinuing herbal medications with your doctor before surgery.    Please shower at home the evening before and the morning of surgery using an antiseptic agent, if provided, or antibacterial soap.    If you suspect that you have an infection prior to surgery, contact your doctor and surgeon prior to surgery.  Also tell the pre-surgery nurse on the day of surgery.    Additional instructions:    Contact information:    Surgical Oncology Main office # [phone]  Nurse :  Serita Butcher, RN  [phone]  Administrative Assistant: Wylene Men [phone]

## 2013-06-12 ENCOUNTER — Inpatient Hospital Stay: Admit: 2013-06-12 | Payer: PRIVATE HEALTH INSURANCE

## 2013-06-12 DIAGNOSIS — Z01818 Encounter for other preprocedural examination: Secondary | ICD-10-CM

## 2013-06-12 LAB — CBC
Hematocrit: 38.2 % (ref 35.0–45.0)
Hemoglobin: 13 g/dL (ref 11.7–15.5)
MCH: 30.7 pg (ref 27.0–33.0)
MCHC: 34.1 g/dL (ref 32.0–36.0)
MCV: 89.8 fL (ref 80.0–100.0)
MPV: 7.7 fL (ref 7.5–11.5)
Platelets: 283 10*3/uL (ref 140–400)
RBC: 4.26 10*6/uL (ref 3.80–5.10)
RDW: 12.9 % (ref 11.0–15.0)
WBC: 9.9 10*3/uL (ref 3.8–10.8)

## 2013-06-15 NOTE — Unmapped (Signed)
Here to discuss options for breast reconstruction  For right mastectomy for multicentriv disease and left prophylactice  PMH sig fro breast ca, fibromyalgia    On exam alert and pleasant  Large ptotic breasts  w no definite masses or adenoathy  Obese  Abdomen soft    Discussed options for breast reconstruction in detail  Rec bilateral expander placements at time of mastectomies  Risks, potential benefits, and alternatives reviewed in detail. All questions answered and she agrees to proceed.

## 2013-06-17 NOTE — Unmapped (Signed)
History of Present Illness:   Sarah Huang is a 58 y.o. female  Who returns for consent prior to bilateral mastectomies with reconstruction for recently diagnosed right breast cancer.    HPI Breast History:   Race: caucasian   Ethnicity: No Ashkenazi [place], [place], or Amish ancestry   Age of Menarche:15   LMP: December 2002   Gravida 3 Para 2 Misc 1   Age at first childbirth:21   History of breast feeding: no   History of hormonal contraceptives:yes about 4 years   Age of Menopause: Total hysterectomy at 46   History of HRT: yes, 3 months premarin   Assisted Reproduction Treatments: no   Hereditary risk factors (pre-breast biopsy): no   Prior history of breast biopsy: 05-10-13   Family history of breast or ovarian cancer: yes, paternal aunt diagnosed with breast cancer age of diagnosis unknown, paternal cousin diagnosed with breast cancer at age 84   Family history of other cancers: yes, maternal aunt diagnosed with colon cancer at age 6, maternal aunt diagnosed with pancreatic cancer at age 27   Patient interested in future childbearing:no   Patient interested in meeting with a fertility specialist: no   Comments:      HPI          Histories:     She has a past medical history of Hyperlipidemia; Kidney stones; Depression; and Dyslipidemia.    She has past surgical history that includes Tonsillectomy; Salpingoophorectomy (2004); Sinus surgery; mid urethral sling  (2002); Esophagogastroduodenoscopy (N/A, 05/29/2012); Colonoscopy (N/A, 05/29/2012); Colonoscopy (N/A, 06/27/2012); and Hysterectomy (2004).    Her family history includes Breast cancer in her other, other, and paternal aunt; COPD in her brother, father, mother, other, and sister; Coronary artery disease in her father and mother; Diabetes in her mother; Emphysema in her father; Heart attack in her brother; Heart disease in her brother and mother; Heart failure in her father; Hyperlipidemia in her father and mother; Hypertension in her mother;  Other in her brother and sister.    She reports that she has quit smoking. She has never used smokeless tobacco. She reports that she does not drink alcohol or use illicit drugs.      Review of Systems   Constitutional: Positive for appetite change. Negative for fever, weight loss, diaphoresis, activity change and fatigue.   HENT: Positive for postnasal drip and sinus pressure. Negative for congestion and sore throat.    Respiratory: Negative for cough, chest tightness and shortness of breath.    Cardiovascular: Negative for chest pain and palpitations.   Gastrointestinal: Negative for nausea, vomiting, abdominal pain and constipation.   Genitourinary: Negative for dysuria, urgency, frequency, decreased urine volume and difficulty urinating.   Musculoskeletal: Negative for arthralgias, back pain and neck pain.   Skin: Positive for wound (healing core biopsy sites).   Neurological: Positive for light-headedness and headaches (allergies ). Negative for dizziness and weakness.   Psychiatric/Behavioral: Negative for depression, suicidal ideas and dysphoric mood. The patient is not nervous/anxious.        Allergies:   Crestor and No known allergies    Medications:     Outpatient Encounter Prescriptions as of 06/11/2013   Medication Sig Dispense Refill   ??? acyclovir (ZOVIRAX) 5 % cream Apply topically 5 times daily as needed.  15 g  1   ??? alendronate (FOSAMAX) 70 MG tablet TAKE 1 TABLET BY MOUTH EVERY 7 DAYS  12 tablet  3   ??? atorvastatin (LIPITOR) 40 MG tablet  Take 1 tablet (40 mg total) by mouth daily.  30 tablet  5   ??? cetirizine (ZYRTEC) 10 MG tablet Take 10 mg by mouth daily.       ??? ergocalciferol (VITAMIN D2) 50,000 unit capsule TAKE 1 CAPSULE BY MOUTH WEEKLY- VIT D #3       ??? glucosamine-chondroitin 500-400 mg tablet Take 1 tablet by mouth 3 times a day.       ??? magnesium 250 mg Tab Take by mouth.       ??? montelukast (SINGULAIR) 10 mg tablet Take 1 tablet (10 mg total) by mouth at bedtime.  30 tablet  3   ??? omega-3  fatty acids-fish oil (FISH OIL) 360-1,200 mg Cap Take 1 capsule (1,200 mg total) by mouth daily.  30 capsule  5   ??? omeprazole (PRILOSEC) 40 MG capsule TAKE ONE CAPSULE BY MOUTH DAILY  30 capsule  11   ??? SUMAtriptan (IMITREX) 100 MG tablet Take one tablet po daily as needed for migraine, may repeat in 2 hours if needed in 24 hours.  12 tablet  2   ??? [DISCONTINUED] VITAMIN D2 50,000 unit capsule TAKE 1 CAPSULE BY MOUTH WEEKLY  12 capsule  3     No facility-administered encounter medications on file as of 06/11/2013.        Objective:       Blood pressure 135/77, pulse 72, temperature 97.2 ??F (36.2 ??C), temperature source Oral, height 5' 3.75 (1.619 m), weight 205 lb (92.987 kg), SpO2 99.00%.    Physical Exam   Nursing note and vitals reviewed.  Constitutional: She is oriented to person, place, and time. She appears well-developed and well-nourished.   HENT:   Head: Normocephalic and atraumatic.   Eyes: Conjunctivae are normal. Pupils are equal, round, and reactive to light.   Neck: Normal range of motion. Neck supple.   Cardiovascular: Normal rate and regular rhythm.    Pulmonary/Chest: Effort normal and breath sounds normal. No respiratory distress.   Musculoskeletal: Normal range of motion. She exhibits no edema.   Neurological: She is alert and oriented to person, place, and time.   Skin: Skin is warm and dry.   Psychiatric: She has a normal mood and affect. Her behavior is normal. Judgment and thought content normal.       Review of Lab Results:      Lab Results   Component Value Date    WBC 9.9 06/12/2013    HGB 13.0 06/12/2013    HCT 38.2 06/12/2013    PLT 283 06/12/2013    GLUCOSE 88 06/11/2013    GLUCOSE 86 10/08/2010    CREATININE 0.60 06/11/2013    NA 137 06/11/2013    K 4.5 06/11/2013    CL 106 06/11/2013    CO2 24 06/11/2013    GFRNONAFRAM 107 04/13/2011    BILITOT 0.4 04/02/2013    PROT 7.4 04/02/2013    PROT 7.2 09/20/2005    AST 18 04/13/2011    ALT 16 04/02/2013    ALKPHOS 60 04/02/2013       Imaging:       Pathology:       Cancer  Staging:   Breast cancer    Primary site: Breast (Right)    Staging method: AJCC 7th Edition    Clinical: Stage IA (T1b, N0, cM0) signed by Cristal Generous, MD on 05/17/2013  9:23 AM    Summary: Stage IA (T1b, N0, cM0)       Assessment:  58 y.o. 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 Options for surgical treatment of the breast primary:   In general options include breast conservation or mastectomy. She is not an ideal candidate for lumpectomy due to the multicentric nature of the cancer.     She had already elected to proceed with right mastectomy and has also decided to proceed with contralateral prophylactic mastectomy.     Skin sparing mastectomy removes the nipple areolar complex and some portion of the breast skin but leaves the majority for reconstruction. Risks include bleeding, infection, scarring, pain and/or numbness, partial/full thickness skin loss, and seroma/hematoma formation. While the risks are much lower than with breast conservation, she is still at a small risk of local recurrence after mastectomy. Reconstruction and drain placement will be according to the reconstructive surgeon.     She is interested in contralateral prophylactic mastectomy. I explained that contralateral prophylactic mastectomy in a patient without a strong family history or known genetic mutation does not have a known survival benefit. It is associated with an increased risk of surgical complications. However, in select patients it may be an appropriate choice due to the decreased risk of developing a second breast cancer. There is an approximately a 3-6% (likely lower if pre-op MRI is unremarkable) chance of identifying an imaging occult breast cancer in a contralateral prophylactic mastectomy specimen.     2. Lymph node evaluation:   For this procedure she will receive an injection of technetium dye pre-operatively and  possibly blue (lymphazurin or methylene) dye intra-operatively in her RIGHT breast the day of surgery. These nodes will be sent for intra-operative pathologic evaluation which will help [place] determine whether she needs any additional surgery or radiation. Risks include bleeding, infection, injury to neurovascular structures resulting in pain/numbness/motor dysfunction, seroma formation, and lymphedema. Blue dye may cause discoloration of the skin (may rarely last up to a year) and urine (transient), blue urticaria, and rare anaphylaxis or skin necrosis.     We did also discuss the possibility of an axillary lymph node dissection in the event of inability to identify a sentinel lymph node or if her sentinel lymph node biopsy is positive. She understands that the risks of axillary lymph node dissection are essentially those of sentinel lymph node biopsy but the likelihood of experiencing side effect (namely lymphedema and pain) are higher. Risks of axillary lymph node dissection include bleeding, infection, injury to neurovascular structures resulting in pain/numbness/motor dysfunction, seroma formation, and lymphedema. She also understands that if an axillary lymph node dissection is required she will require drain placement for approximately 2 weeks and will need to stay in the hospital overnight.     We did discuss the risks and benefits of performing a sentinel lymph node biopsy on the contralateral side. The benefits of this procedure are of questionable value and the procedure while low risk is not without risk. We will NOT perform a left sentinel lymph node biopsy.    3. Systemic therapy:   We spoke briefly about adjuvant therapies which may include chemotherapy, immunotherapy, and/or hormonal therapy.   -she may chemotherapy   -she will require hormonal therapy.   -she does not want a pre-operative referral to medical oncology.     4. Radiation therapy:   -she understands there is a small chance that she will  require post-mastectomy radiation therapy.   -she does not  want a pre-operative referral to radiation oncology.     5. Plastic and reconstructive surgery:   -she has seen Dr. Jens Som who is planning to place tissue expanders at the time of her mastectomies    6. Staging studies are not indicated at this time.     7. Imaging - her imaging and reports have been reviewed. Will not proceed with MRI as she is planning mastectomy.     8. Genetics   -she has seen genetics, testing results pending    9. Fertility   - pt is post-menopausal.     10. Multidisciplinary breast conference - we will submit her case for review after surgery.     11. Follow up - return after surgery

## 2013-06-17 NOTE — Unmapped (Signed)
History of Present Illness:   Sarah Huang is a 58 y.o. female  Who returns for consent prior to bilateral mastectomies with reconstruction for recently diagnosed right breast cancer.    HPI Breast History:   Race: caucasian   Ethnicity: No Ashkenazi Jewish, [place], or Amish ancestry   Age of Menarche:15   LMP: December 2002   Gravida 3 Para 2 Misc 1   Age at first childbirth:21   History of breast feeding: no   History of hormonal contraceptives:yes about 4 years   Age of Menopause: Total hysterectomy at 46   History of HRT: yes, 3 months premarin   Assisted Reproduction Treatments: no   Hereditary risk factors (pre-breast biopsy): no   Prior history of breast biopsy: 05-10-13   Family history of breast or ovarian cancer: yes, paternal aunt diagnosed with breast cancer age of diagnosis unknown, paternal cousin diagnosed with breast cancer at age 40   Family history of other cancers: yes, maternal aunt diagnosed with colon cancer at age 49, maternal aunt diagnosed with pancreatic cancer at age 72   Patient interested in future childbearing:no   Patient interested in meeting with a fertility specialist: no   Comments:      HPI          Histories:     She has a past medical history of Hyperlipidemia; Kidney stones; Depression; and Dyslipidemia.    She has past surgical history that includes Tonsillectomy; Salpingoophorectomy (2004); Sinus surgery; mid urethral sling  (2002); Esophagogastroduodenoscopy (N/A, 05/29/2012); Colonoscopy (N/A, 05/29/2012); Colonoscopy (N/A, 06/27/2012); and Hysterectomy (2004).    Her family history includes Breast cancer in her other, other, and paternal aunt; COPD in her brother, father, mother, other, and sister; Coronary artery disease in her father and mother; Diabetes in her mother; Emphysema in her father; Heart attack in her brother; Heart disease in her brother and mother; Heart failure in her father; Hyperlipidemia in her father and mother; Hypertension in her mother;  Other in her brother and sister.    She reports that she has quit smoking. She has never used smokeless tobacco. She reports that she does not drink alcohol or use illicit drugs.      Review of Systems   Constitutional: Positive for appetite change. Negative for fever, weight loss, diaphoresis, activity change and fatigue.   HENT: Positive for postnasal drip and sinus pressure. Negative for congestion and sore throat.    Respiratory: Negative for cough, chest tightness and shortness of breath.    Cardiovascular: Negative for chest pain and palpitations.   Gastrointestinal: Negative for nausea, vomiting, abdominal pain and constipation.   Genitourinary: Negative for dysuria, urgency, frequency, decreased urine volume and difficulty urinating.   Musculoskeletal: Negative for arthralgias, back pain and neck pain.   Skin: Positive for wound (healing core biopsy sites).   Neurological: Positive for light-headedness and headaches (allergies ). Negative for dizziness and weakness.   Psychiatric/Behavioral: Negative for depression, suicidal ideas and dysphoric mood. The patient is not nervous/anxious.        Allergies:   Crestor and No known allergies    Medications:     Outpatient Encounter Prescriptions as of 06/11/2013   Medication Sig Dispense Refill   ??? acyclovir (ZOVIRAX) 5 % cream Apply topically 5 times daily as needed.  15 g  1   ??? alendronate (FOSAMAX) 70 MG tablet TAKE 1 TABLET BY MOUTH EVERY 7 DAYS  12 tablet  3   ??? atorvastatin (LIPITOR) 40 MG tablet   Take 1 tablet (40 mg total) by mouth daily.  30 tablet  5   ??? cetirizine (ZYRTEC) 10 MG tablet Take 10 mg by mouth daily.       ??? ergocalciferol (VITAMIN D2) 50,000 unit capsule TAKE 1 CAPSULE BY MOUTH WEEKLY- VIT D #3       ??? glucosamine-chondroitin 500-400 mg tablet Take 1 tablet by mouth 3 times a day.       ??? magnesium 250 mg Tab Take by mouth.       ??? montelukast (SINGULAIR) 10 mg tablet Take 1 tablet (10 mg total) by mouth at bedtime.  30 tablet  3   ??? omega-3  fatty acids-fish oil (FISH OIL) 360-1,200 mg Cap Take 1 capsule (1,200 mg total) by mouth daily.  30 capsule  5   ??? omeprazole (PRILOSEC) 40 MG capsule TAKE ONE CAPSULE BY MOUTH DAILY  30 capsule  11   ??? SUMAtriptan (IMITREX) 100 MG tablet Take one tablet po daily as needed for migraine, may repeat in 2 hours if needed in 24 hours.  12 tablet  2   ??? [DISCONTINUED] VITAMIN D2 50,000 unit capsule TAKE 1 CAPSULE BY MOUTH WEEKLY  12 capsule  3     No facility-administered encounter medications on file as of 06/11/2013.        Objective:       Blood pressure 135/77, pulse 72, temperature 97.2 ??F (36.2 ??C), temperature source Oral, height 5' 3.75 (1.619 m), weight 205 lb (92.987 kg), SpO2 99.00%.    Physical Exam   Nursing note and vitals reviewed.  Constitutional: She is oriented to person, place, and time. She appears well-developed and well-nourished.   HENT:   Head: Normocephalic and atraumatic.   Eyes: Conjunctivae are normal. Pupils are equal, round, and reactive to light.   Neck: Normal range of motion. Neck supple.   Cardiovascular: Normal rate and regular rhythm.    Pulmonary/Chest: Effort normal and breath sounds normal. No respiratory distress.   Musculoskeletal: Normal range of motion. She exhibits no edema.   Neurological: She is alert and oriented to person, place, and time.   Skin: Skin is warm and dry.   Psychiatric: She has a normal mood and affect. Her behavior is normal. Judgment and thought content normal.       Review of Lab Results:      Lab Results   Component Value Date    WBC 9.9 06/12/2013    HGB 13.0 06/12/2013    HCT 38.2 06/12/2013    PLT 283 06/12/2013    GLUCOSE 88 06/11/2013    GLUCOSE 86 10/08/2010    CREATININE 0.60 06/11/2013    NA 137 06/11/2013    K 4.5 06/11/2013    CL 106 06/11/2013    CO2 24 06/11/2013    GFRNONAFRAM 107 04/13/2011    BILITOT 0.4 04/02/2013    PROT 7.4 04/02/2013    PROT 7.2 09/20/2005    AST 18 04/13/2011    ALT 16 04/02/2013    ALKPHOS 60 04/02/2013       Imaging:       Pathology:       Cancer  Staging:   Breast cancer    Primary site: Breast (Right)    Staging method: AJCC 7th Edition    Clinical: Stage IA (T1b, N0, cM0) signed by Rayni Nemitz D Aniesha Haughn, MD on 05/17/2013  9:23 AM    Summary: Stage IA (T1b, N0, cM0)       Assessment:     58 y.o. 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 Options for surgical treatment of the breast primary:   In general options include breast conservation or mastectomy. She is not an ideal candidate for lumpectomy due to the multicentric nature of the cancer.     She had already elected to proceed with right mastectomy and has also decided to proceed with contralateral prophylactic mastectomy.     Skin sparing mastectomy removes the nipple areolar complex and some portion of the breast skin but leaves the majority for reconstruction. Risks include bleeding, infection, scarring, pain and/or numbness, partial/full thickness skin loss, and seroma/hematoma formation. While the risks are much lower than with breast conservation, she is still at a small risk of local recurrence after mastectomy. Reconstruction and drain placement will be according to the reconstructive surgeon.     She is interested in contralateral prophylactic mastectomy. I explained that contralateral prophylactic mastectomy in a patient without a strong family history or known genetic mutation does not have a known survival benefit. It is associated with an increased risk of surgical complications. However, in select patients it may be an appropriate choice due to the decreased risk of developing a second breast cancer. There is an approximately a 3-6% (likely lower if pre-op MRI is unremarkable) chance of identifying an imaging occult breast cancer in a contralateral prophylactic mastectomy specimen.     2. Lymph node evaluation:   For this procedure she will receive an injection of technetium dye pre-operatively and  possibly blue (lymphazurin or methylene) dye intra-operatively in her RIGHT breast the day of surgery. These nodes will be sent for intra-operative pathologic evaluation which will help us determine whether she needs any additional surgery or radiation. Risks include bleeding, infection, injury to neurovascular structures resulting in pain/numbness/motor dysfunction, seroma formation, and lymphedema. Blue dye may cause discoloration of the skin (may rarely last up to a year) and urine (transient), blue urticaria, and rare anaphylaxis or skin necrosis.     We did also discuss the possibility of an axillary lymph node dissection in the event of inability to identify a sentinel lymph node or if her sentinel lymph node biopsy is positive. She understands that the risks of axillary lymph node dissection are essentially those of sentinel lymph node biopsy but the likelihood of experiencing side effect (namely lymphedema and pain) are higher. Risks of axillary lymph node dissection include bleeding, infection, injury to neurovascular structures resulting in pain/numbness/motor dysfunction, seroma formation, and lymphedema. She also understands that if an axillary lymph node dissection is required she will require drain placement for approximately 2 weeks and will need to stay in the hospital overnight.     We did discuss the risks and benefits of performing a sentinel lymph node biopsy on the contralateral side. The benefits of this procedure are of questionable value and the procedure while low risk is not without risk. We will NOT perform a left sentinel lymph node biopsy.    3. Systemic therapy:   We spoke briefly about adjuvant therapies which may include chemotherapy, immunotherapy, and/or hormonal therapy.   -she may chemotherapy   -she will require hormonal therapy.   -she does not want a pre-operative referral to medical oncology.     4. Radiation therapy:   -she understands there is a small chance that she will  require post-mastectomy radiation therapy.   -she does not   want a pre-operative referral to radiation oncology.     5. Plastic and reconstructive surgery:   -she has seen Dr. Kitzmiller who is planning to place tissue expanders at the time of her mastectomies    6. Staging studies are not indicated at this time.     7. Imaging - her imaging and reports have been reviewed. Will not proceed with MRI as she is planning mastectomy.     8. Genetics   -she has seen genetics, testing results pending    9. Fertility   - pt is post-menopausal.     10. Multidisciplinary breast conference - we will submit her case for review after surgery.     11. Follow up - return after surgery

## 2013-06-17 NOTE — Unmapped (Signed)
pre-op, med. & arrival time (0800) instructions left on home/moble VM. request call back.

## 2013-06-17 NOTE — Unmapped (Signed)
Pt. Returned call - phone screen completed.

## 2013-06-19 ENCOUNTER — Inpatient Hospital Stay: Admit: 2013-06-19 | Payer: PRIVATE HEALTH INSURANCE

## 2013-06-19 ENCOUNTER — Inpatient Hospital Stay
Admit: 2013-06-19 | Discharge: 2013-06-21 | Disposition: A | Discharge status: Home / Self Care | Payer: PRIVATE HEALTH INSURANCE | Source: Ambulatory Visit

## 2013-06-19 DIAGNOSIS — C50919 Malignant neoplasm of unspecified site of unspecified female breast: Secondary | ICD-10-CM

## 2013-06-19 DIAGNOSIS — C50911 Malignant neoplasm of unspecified site of right female breast: Secondary | ICD-10-CM

## 2013-06-19 MED ORDER — acetaminophen (TYLENOL) tablet 650 mg
325 | Freq: Once | ORAL | Status: AC
Start: 2013-06-19 — End: 2013-06-19
  Administered 2013-06-19: 14:00:00 650 mg via ORAL

## 2013-06-19 MED ORDER — HYDROcodone-acetaminophen (NORCO) 5-325 mg per tablet 2 tablet
5-325 | ORAL | Status: AC | PRN
Start: 2013-06-19 — End: 2013-06-20

## 2013-06-19 MED ORDER — fentaNYL (SUBLIMAZE) injection 50 mcg
50 | INTRAMUSCULAR | Status: AC | PRN
Start: 2013-06-19 — End: 2013-06-19

## 2013-06-19 MED ORDER — oxyCODONE (ROXICODONE) immediate release tablet 5 mg
5 | Freq: Once | ORAL | Status: AC
Start: 2013-06-19 — End: 2013-06-19
  Administered 2013-06-19: 21:00:00 5 mg via ORAL

## 2013-06-19 MED ORDER — cyclobenzaprine (FLEXERIL) tablet 5 mg
5 | Freq: Three times a day (TID) | ORAL | Status: AC
Start: 2013-06-19 — End: 2013-06-21
  Administered 2013-06-20 – 2013-06-21 (×4): 5 mg via ORAL

## 2013-06-19 MED ORDER — HYDROmorphone (DILAUDID) injection Syrg 0.5 mg
1 | INTRAMUSCULAR | Status: AC | PRN
Start: 2013-06-19 — End: 2013-06-19

## 2013-06-19 MED ORDER — albumin human bottle 5%
5 | INTRAVENOUS | Status: AC | PRN
Start: 2013-06-19 — End: 2013-06-19
  Administered 2013-06-19: 17:00:00 via INTRAVENOUS

## 2013-06-19 MED ORDER — fish oil-omega-3 fatty acids Cap 1,000 mg
340-1000 | Freq: Every day | ORAL | Status: AC
Start: 2013-06-19 — End: 2013-06-21
  Administered 2013-06-20 – 2013-06-21 (×2): 1000 mg via ORAL

## 2013-06-19 MED ORDER — Tc-99m technetium tilmanocept (LYMPHOSEEK) 0.55 milli Curie
Freq: Once | Status: AC | PRN
Start: 2013-06-19 — End: 2013-06-19
  Administered 2013-06-19: 11:00:00 0.55 via SUBCUTANEOUS

## 2013-06-19 MED ORDER — fentaNYL (SUBLIMAZE) injection 25 mcg
50 | INTRAMUSCULAR | Status: AC | PRN
Start: 2013-06-19 — End: 2013-06-19
  Administered 2013-06-19 (×2): 25 ug via INTRAVENOUS

## 2013-06-19 MED ORDER — ondansetron (ZOFRAN) 4 mg/2 mL injection 4 mg
4 | Freq: Three times a day (TID) | INTRAMUSCULAR | Status: AC | PRN
Start: 2013-06-19 — End: 2013-06-19

## 2013-06-19 MED ORDER — fentaNYL (SUBLIMAZE) injection 12.5 mcg
50 | INTRAMUSCULAR | Status: AC | PRN
Start: 2013-06-19 — End: 2013-06-19

## 2013-06-19 MED ORDER — fentaNYL (SUBLIMAZE) injection
50 | INTRAMUSCULAR | Status: AC | PRN
Start: 2013-06-19 — End: 2013-06-19
  Administered 2013-06-19: 14:00:00 150 via INTRAVENOUS
  Administered 2013-06-19 (×2): 50 via INTRAVENOUS

## 2013-06-19 MED ORDER — ondansetron (ZOFRAN) 4 mg/2 mL injection 8 mg
4 | Freq: Four times a day (QID) | INTRAMUSCULAR | Status: AC | PRN
Start: 2013-06-19 — End: 2013-06-21

## 2013-06-19 MED ORDER — lidocaine (PF) 20 mg/mL (2 %) Soln
20 | INTRAVENOUS | Status: AC | PRN
Start: 2013-06-19 — End: 2013-06-19
  Administered 2013-06-19: 14:00:00 60 via INTRAVENOUS

## 2013-06-19 MED ORDER — nalOXone (NARCAN) injection 0.04 mg
0.4 | INTRAMUSCULAR | Status: AC | PRN
Start: 2013-06-19 — End: 2013-06-19

## 2013-06-19 MED ORDER — ondansetron (ZOFRAN) 4 mg/2 mL injection
4 | INTRAMUSCULAR | Status: AC | PRN
Start: 2013-06-19 — End: 2013-06-19
  Administered 2013-06-19: 18:00:00 4 via INTRAVENOUS

## 2013-06-19 MED ORDER — atorvastatin (LIPITOR) tablet 40 mg
40 | Freq: Every day | ORAL | Status: AC
Start: 2013-06-19 — End: 2013-06-21
  Administered 2013-06-21: 01:00:00 40 mg via ORAL

## 2013-06-19 MED ORDER — midazolam (PF) (VERSED) injection
1 | INTRAMUSCULAR | Status: AC | PRN
Start: 2013-06-19 — End: 2013-06-19
  Administered 2013-06-19: 14:00:00 2 via INTRAVENOUS

## 2013-06-19 MED ORDER — propofol 10 mg/ml (DIPRIVAN) injection
10 | INTRAVENOUS | Status: AC | PRN
Start: 2013-06-19 — End: 2013-06-19
  Administered 2013-06-19: 14:00:00 200 via INTRAVENOUS

## 2013-06-19 MED ORDER — montelukast (SINGULAIR) tablet 10 mg
10 | Freq: Every evening | ORAL | Status: AC
Start: 2013-06-19 — End: 2013-06-21
  Administered 2013-06-20 – 2013-06-21 (×2): 10 mg via ORAL

## 2013-06-19 MED ORDER — sodium chloride, irrigation 0.9 % irrigation
0.9 | Status: AC | PRN
Start: 2013-06-19 — End: 2013-06-19
  Administered 2013-06-19 (×2): 100

## 2013-06-19 MED ORDER — esmolol (BREVIBLOC) injection
100 | INTRAVENOUS | Status: AC | PRN
Start: 2013-06-19 — End: 2013-06-20
  Administered 2013-06-19: 19:00:00 30 via INTRAVENOUS

## 2013-06-19 MED ORDER — scopolamine (TRANSDERM-SCOP) 1.5 mg
1 | TRANSDERMAL | Status: AC | PRN
Start: 2013-06-19 — End: 2013-06-19
  Administered 2013-06-19: 13:00:00 1 via TRANSDERMAL

## 2013-06-19 MED ORDER — HYDROmorphone (DILAUDID) injection Syrg 0.2 mg
1 | INTRAMUSCULAR | Status: AC | PRN
Start: 2013-06-19 — End: 2013-06-19

## 2013-06-19 MED ORDER — HYDROmorphone (DILAUDID) injection Syrg 0.6 mg
1 | INTRAMUSCULAR | Status: AC | PRN
Start: 2013-06-19 — End: 2013-06-19

## 2013-06-19 MED ORDER — cetirizine (ZYRTEC) tablet 10 mg
10 | Freq: Every day | ORAL | Status: AC
Start: 2013-06-19 — End: 2013-06-21
  Administered 2013-06-20 – 2013-06-21 (×2): 10 mg via ORAL

## 2013-06-19 MED ORDER — promethazine (PHENERGAN) injection 6.25 mg
25 | Freq: Four times a day (QID) | INTRAMUSCULAR | Status: AC | PRN
Start: 2013-06-19 — End: 2013-06-19

## 2013-06-19 MED ORDER — HYDROcodone-acetaminophen (NORCO) 5-325 mg per tablet 1 tablet
5-325 | ORAL | Status: AC | PRN
Start: 2013-06-19 — End: 2013-06-20
  Administered 2013-06-20: 01:00:00 1 via ORAL

## 2013-06-19 MED ORDER — HYDROmorphone (DILAUDID) injection Syrg 1 mg
1 | INTRAMUSCULAR | Status: AC | PRN
Start: 2013-06-19 — End: 2013-06-19

## 2013-06-19 MED ORDER — lactated ringers infusion
INTRAVENOUS | Status: AC
Start: 2013-06-19 — End: 2013-06-20
  Administered 2013-06-20: 04:00:00 75 mL/h via INTRAVENOUS

## 2013-06-19 MED ORDER — dexamethasone (DECADRON) injection 4 mg
4 | Freq: Once | INTRAMUSCULAR | Status: AC | PRN
Start: 2013-06-19 — End: 2013-06-19

## 2013-06-19 MED ORDER — lactated ringers infusion
INTRAVENOUS | Status: AC | PRN
Start: 2013-06-19 — End: 2013-06-19
  Administered 2013-06-19 (×2): via INTRAVENOUS

## 2013-06-19 MED ORDER — HYDROmorphone (DILAUDID) injection Syrg
2 | INTRAMUSCULAR | Status: AC | PRN
Start: 2013-06-19 — End: 2013-06-19
  Administered 2013-06-19 (×4): .5 via INTRAVENOUS

## 2013-06-19 MED ORDER — dexamethasone (DECADRON) injection
4 | INTRAMUSCULAR | Status: AC | PRN
Start: 2013-06-19 — End: 2013-06-19
  Administered 2013-06-19: 18:00:00 4 via INTRAVENOUS

## 2013-06-19 MED ORDER — ceFAZolin (ANCEF) IVPB 2 g in D5W (duplex)
2 | INTRAVENOUS | Status: AC | PRN
Start: 2013-06-19 — End: 2013-06-19
  Administered 2013-06-19 (×2): 2 g via INTRAVENOUS

## 2013-06-19 MED ORDER — ondansetron (ZOFRAN) 4 mg/2 mL injection 4 mg
4 | Freq: Four times a day (QID) | INTRAMUSCULAR | Status: AC | PRN
Start: 2013-06-19 — End: 2013-06-21

## 2013-06-19 MED ORDER — gentamicin 160 mg in sodium chloride, irrigation 0.9 % 1,000 mL IRRIGATION
0.9 | Status: AC | PRN
Start: 2013-06-19 — End: 2013-06-19
  Administered 2013-06-19: 17:00:00 160 mg

## 2013-06-19 MED ORDER — HYDROmorphone (DILAUDID) injection Syrg 0.4 mg
1 | INTRAMUSCULAR | Status: AC | PRN
Start: 2013-06-19 — End: 2013-06-19
  Administered 2013-06-19: 19:00:00 0.4 mg via INTRAVENOUS

## 2013-06-19 MED ORDER — pantoprazole (PROTONIX) EC tablet 40 mg
40 | Freq: Every day | ORAL | Status: AC
Start: 2013-06-19 — End: 2013-06-21
  Administered 2013-06-20 – 2013-06-21 (×2): 40 mg via ORAL

## 2013-06-19 MED ORDER — sodium chloride, irrigation 0.9 % irrigation
0.9 | Status: AC | PRN
Start: 2013-06-19 — End: 2013-06-19
  Administered 2013-06-19 (×2): 1000

## 2013-06-19 MED ORDER — ceFAZolin (ANCEF) IVPB 2 g in D5W (duplex)
2 | INTRAVENOUS | Status: AC | PRN
Start: 2013-06-19 — End: 2013-06-19

## 2013-06-19 MED ORDER — rocuroniumZEMURONinjection
10 | INTRAVENOUS | Status: AC | PRN
Start: 2013-06-19 — End: 2013-06-19
  Administered 2013-06-19: 14:00:00 50 via INTRAVENOUS

## 2013-06-19 MED ORDER — SUMAtriptan (IMITREX) tablet Tab 100 mg
100 | Freq: Two times a day (BID) | ORAL | Status: AC | PRN
Start: 2013-06-19 — End: 2013-06-21

## 2013-06-19 MED FILL — PANTOPRAZOLE 40 MG TABLET,DELAYED RELEASE: 40 40 MG | ORAL | Qty: 1

## 2013-06-19 MED FILL — TC-99M TECHNETIUM TILMANOCEPT: 0.55 0.55 millicurie | Qty: 1

## 2013-06-19 MED FILL — SINGULAIR 10 MG TABLET: 10 10 mg | ORAL | Qty: 1

## 2013-06-19 MED FILL — OXYCODONE 5 MG TABLET: 5 5 MG | ORAL | Qty: 1

## 2013-06-19 MED FILL — ATORVASTATIN 40 MG TABLET: 40 40 MG | ORAL | Qty: 1

## 2013-06-19 MED FILL — FENTANYL (PF) 50 MCG/ML INJECTION SOLUTION: 50 50 mcg/mL | INTRAMUSCULAR | Qty: 5

## 2013-06-19 MED FILL — SUMATRIPTAN 100 MG TABLET: 100 100 MG | ORAL | Qty: 1

## 2013-06-19 MED FILL — CETIRIZINE 10 MG TABLET: 10 10 MG | ORAL | Qty: 1

## 2013-06-19 MED FILL — FISH OIL 340 MG-1,000 MG CAPSULE: 340-1000 340-1,000 mg | ORAL | Qty: 1

## 2013-06-19 MED FILL — CYCLOBENZAPRINE 5 MG TABLET: 5 5 MG | ORAL | Qty: 1

## 2013-06-19 MED FILL — HYDROMORPHONE 1 MG/ML INJECTION SYRINGE: 1 1 mg/mL | INTRAMUSCULAR | Qty: 1

## 2013-06-19 MED FILL — CEFAZOLIN 2 GRAM/50 ML IN DEXTROSE (ISO-OSMOTIC) INTRAVENOUS PIGGYBACK: 2 2 gram/50 mL | INTRAVENOUS | Qty: 50

## 2013-06-19 MED FILL — TYLENOL 325 MG TABLET: 325 325 mg | ORAL | Qty: 2

## 2013-06-19 MED FILL — HYDROCODONE 5 MG-ACETAMINOPHEN 325 MG TABLET: 5-325 5-325 mg | ORAL | Qty: 1

## 2013-06-19 NOTE — Unmapped (Signed)
Dr Melvyn Neth at bedside to check pt operative sites and update pt and family.  Pt daughter ordering dinner.

## 2013-06-19 NOTE — Unmapped (Signed)
Family called and updated on procedure.   Sarah Huang JO BAKER

## 2013-06-19 NOTE — Unmapped (Signed)
SURGEON: Maximino Sarin. Melvyn Neth, M.D.   ASSISTANT: Angus Palms, MD    DATE OF SURGERY: 06/19/2013    PREOPERATIVE DIAGNOSIS:   1. Right breast cancer   2. Desire for left prophylactic mastectomy     POSTOPERATIVE DIAGNOSIS:   1. Right breast cancer   2. Desire for left prophylactic mastectomy     PROCEDURE(S) PERFORMED:   1. Bilateral skin sparing mastectomies   2. Right sentinel lymph node biopsy     ANESTHESIA: general endotracheal     ESTIMATED BLOOD LOSS: less than 50 ml     SPECIMENS REMOVED:   1. Left breast (short stitch superior, long stitch lateral)   2. Right breast (short stitch superior, long stitch lateral)   3. Right axillary sentinel lymph node #1, count [phone]    INDICATION FOR PROCEDURE: The patient is a 58 year old woman recently diagnosed with multicentric right breast cancer. After evaluation and consultation she elected bilateral skin sparing mastectomies and right sentinel lymph node biopsies. Risks, benefits, and alternatives were discussed and she elected to proceed. She also met with Dr. Jens Som to plan immediate reconstruction.    DETAILS OF PROCEDURE:   On the morning of the patient's operation, she initially underwent a right technetium injection for sentinel lymph node mapping. Mrs. Pearlman was then brought to the operating room where she was placed supine on the OR table. After undergoing general endotracheal anesthesia, she was positioned and all bony prominences padded.Her chest wall, neck, and right upper extremity were then prepped with ChloraPrep and draped in a sterile manner. A timeout was performed according to institutional standards.     Starting with the left side, an elliptical incision was created sharply around the nipple areolar complex. Electrocautery was then used to raise skin flaps superiorly to the clavicle, inferiorly to the inframammary fold, medially to the lateral sternal border and laterally to the latissimus dorsi. The breast was then elevated off of the chest wall  taking the pectoral fascia. The breast was oriented with a short stitch superiorly and a long stitch laterally.     At this point Dr. Jens Som was called into the room and he started her reconstruction on this side. His portion of the case will be dictated in a separate operative note.     Moving to the right side, an elliptical incision was again created sharply. Electrocautery was then used to raise skin flaps superiorly to the clavicle, inferiorly to the inframammary fold, medially to the lateral sternal border and laterally to the latissimus dorsi. The breast was then elevated off of the chest wall taking the pectoral fascia. The breast was oriented with a short stitch superiorly and a long stitch laterally.     The gamma probe was used to identify the area of highest signal in the left axilla. The clavipectoral fascia was opened and the axillary contents identified. The probe was used to identify the area of highest signal and a long soft node was found and carefully excised. A 10 second count of the node was 13,872. This was sent for intraoperative evaluation as sentinel lymph node #1. The axilla was re-examined and no further areas of high signal identified. There were no abnormal palpable nodes identified. While we did not identify them during the course of the operation, care was taken to protect the major neurovascular structures of the axilla. The cavity was irrigated and hemostasis ensured. I proceeded to review the touch prep and frozen section with the pathologist and they were  considered negative.     The case was turned over to Dr. Jens Som who completed the reconstruction.     Of note, the complexity of and time required for completion of the case was increased by at least 30 minutes per side due to the small incisions required to maintain her skin flaps for reconstruction.     I was present and scrubbed for the critical portions of the bilateral mastectomies and directed all aspects of its  conduct. Instrument, sponge, and needle counts for the bilateral mastectomies were correct at the completion of this portion of the case.

## 2013-06-19 NOTE — Unmapped (Signed)
Dr. Jens Som at 1:30pm-placement of bilateral tissue expanders  Procedure Note    CORAYMA CASHATT  06/19/2013      Pre-op Diagnosis: breast cancer       Post-op Diagnosis: same    Procedure(s):  placement of bilateral tissue expanders     Surgeon:  Jens Som, MD    Anesthesia: General    Staff:   Circulator: Nelly Rout, RN; Joie Bimler, RN  Scrub Person: Octaviano Batty, CST  2nd Circulator: Purvis Kilts, RN  2nd Scrub: Burnard Hawthorne, RN  Resident: Angus Palms, MD  Gaspar Cola, MD  Ned Card, MD    Estimated Blood Loss: Minimal                 Specimens:   ID Type Source Tests Collected by Time Destination   A : Left Breast, short stitch superior, long stitch lateral Tissue Breast Left SURGICAL PATHOLOGY EXAM Yolanda Manges, MD 06/19/2013 11:37 AM    B : Right Breast, short stitch superior, long stitch lateral Tissue Breast Right SURGICAL PATHOLOGY EXAM Cristal Generous, MD 06/19/2013 12:42 PM    C : Right Axillary Sentinel Lymph node #1 (16109) Tissue Lymph Node SURGICAL PATHOLOGY EXAM Yolanda Manges, MD 06/19/2013  1:03 PM               Drains:   Drain 1 Breast Right (Active)   Number of days:0       Drain 2 Breast Right (Active)   Number of days:0       Drain 3 Breast Left (Active)   Number of days:0       Drain 4 Breast Left (Active)   Number of days:0       Urethral Catheter Straight-tip;Non-latex 16 Fr. (Active)   Number of days:0             There were no complications unless listed below.        Angus Palms, MD    Date: 06/19/2013  Time: 2:38 PM

## 2013-06-19 NOTE — Unmapped (Signed)
Anesthesia Transfer of Care Note    Patient: Sarah Huang  Procedure(s) Performed: Procedure(s):  Dr. Jens Som at 1:30pm-placement of bilateral tissue expanders   Dr. Melvyn Neth to start at 10:00am bilateral total mastectomy   right axillary sentinel lymph node biopsy  -POSS RIGHT AXILLARY NODE DISSECTION    Patient location: PACU    Post pain: Adequate analgesia    Post assessment: no apparent anesthetic complications    Post vital signs:    Filed Vitals:    06/19/13 1449   BP: 123/64   Pulse: 90   Temp: 97 ??F (36.1 ??C)   Resp: 7   SpO2: 95%       Level of consciousness: awake    Complications: None

## 2013-06-19 NOTE — Unmapped (Signed)
Anesthesia Post Note    Patient: Sarah Huang    Procedure(s) Performed: Procedure(s):  Dr. Jens Som at 1:30pm-placement of bilateral tissue expanders   Dr. Melvyn Neth to start at 10:00am bilateral total mastectomy   right axillary sentinel lymph node biopsy  -POSS RIGHT AXILLARY NODE DISSECTION    Anesthesia type: general endotracheal    Patient location: PACU    Post pain: Adequate analgesia    Post assessment: no apparent anesthetic complications    Last Vitals:   Filed Vitals:    06/19/13 1515   BP: 107/60   Pulse: 86   Temp:    Resp: 8   SpO2: 96%       Post vital signs: stable    Level of consciousness: awake, alert  and oriented    Complications: None

## 2013-06-19 NOTE — Unmapped (Signed)
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      S: Doing well. Pain well-controlled at baseline; sharp pain with R arm movement. Minimal nausea.   O: BP 91/56   Pulse 65   Temp(Src) 97 ??F (36.1 ??C) (Temporal)   Resp 11   Ht 5' 3.75 (1.619 m)   Wt 205 lb (92.987 kg)   BMI 35.48 kg/m2   SpO2 98%  Gen: Resting comfortably in bed  CV: RRR, intact distal perfusion  Resp: Breathing comfortably. Lungs CTAB  Chest: Bilateral breast incisions c/d/i with steri strips and gauze in place. Support bra in place. 4 JP drains (2R, 2L) holding suction, small amount of sanguinous drainage, sites c/d/i.     A/P: Doing well postoperatively with no complaints. VSS. Pain well-controlled. Incisions c/d/i. No change to plan at this time. Awaiting transfer to floor. Plastics primary.    Annette Stable MD  Surgery PGY-1

## 2013-06-19 NOTE — Unmapped (Signed)
Anesthesia Extubation Criteria:    Airway Device: endotracheal tube    Emergence Details:      Smooth      _x_      Stormy       __       Prolonged   __     Extubation Criteria:      Motor strength intact       _x_      Follows commands        _x_      Good airway reflexes      _x_      OP suctioned                  _x_        Follows commands:  Yes     Patient extubated:  Yes

## 2013-06-19 NOTE — Unmapped (Signed)
Brief Op Note Procedure Note    Sarah Huang  06/19/2013      Pre-op Diagnosis: breast cancer       Post-op Diagnosis: same    Procedure(s):  bilateral total mastectomy   right axillary sentinel lymph node biopsy    Surgeon:  Maximino Sarin. Melvyn Neth, MD    Anesthesia: General    Staff:   Circulator: Nelly Rout, RN; Joie Bimler, RN  Scrub Person: Octaviano Batty, CST  2nd Circulator: Purvis Kilts, RN  2nd Scrub: Burnard Hawthorne, RN  Resident: Angus Palms, MD  Gaspar Cola, MD    Estimated Blood Loss: less than 50 mL                 Specimens:   ID Type Source Tests Collected by Time Destination   A : Left Breast, short stitch superior, long stitch lateral Tissue Breast Left SURGICAL PATHOLOGY EXAM Yolanda Manges, MD 06/19/2013 11:37 AM    B : Right Breast, short stitch superior, long stitch lateral Tissue Breast Right SURGICAL PATHOLOGY EXAM Cristal Generous, MD 06/19/2013 12:42 PM    C : Right Axillary Sentinel Lymph node #1 (16109) Tissue Lymph Node SURGICAL PATHOLOGY EXAM Yolanda Manges, MD 06/19/2013  1:03 PM               Drains:   Drain 1 Breast Right (Active)   Number of days:0       Drain 2 Breast Right (Active)   Number of days:0       Drain 3 Breast Left (Active)   Number of days:0       Drain 4 Breast Left (Active)   Number of days:0       Urethral Catheter Straight-tip;Non-latex 16 Fr. (Active)   Number of days:0         There were no complications unless listed below.      Angus Palms, MD    Left Breast:  Excision: 1136  Arrived at Pathology: 1152  Wt: 850g    Right Breast:  Excision 1241  Arrived at Pathology: 1245  Wt: 755g      Date: 06/19/2013  Time: 2:32 PM

## 2013-06-19 NOTE — Unmapped (Signed)
Family called and updated on start of procedure.   Sarah Huang

## 2013-06-19 NOTE — Unmapped (Signed)
Admitted to PACU 10 via OR.  Anesthesia report received.  Pt sleeping.

## 2013-06-19 NOTE — Unmapped (Signed)
Pt sleeping unless disturbed by nurse or family.  sts pain tolerable.  Taking po with no nausea.  Meets requirements for PACU discharge.  Has already been signed out by anesthesia.

## 2013-06-19 NOTE — Unmapped (Signed)
H&P reviewed, patient examined, no changes to H&P.

## 2013-06-19 NOTE — Unmapped (Addendum)
ANESTHESIOLOGY PRE-PROCEDURAL EVALUATION    Sarah Huang is a 58 y.o. year old female presenting for:    Procedure(s):  Dr. Jens Som at 1:30pm-placement of bilateral tissue expanders   Dr. Melvyn Neth to start at 10:00am bilateral total mastectomy   right axillary sentinel lymph node biopsy  -POSS RIGHT AXILLARY NODE DISSECTION    Surgeon:   Yolanda Manges, MD    Anesthesia Evaluation         No history of anesthetic complications   I have reviewed the History and Physical Exam, any relevant changes are noted in the anesthesia pre-operative evaluation.    Medical History / Review of Systems:    Cardiovascular:    Exercise tolerance: good  Duke Met score: 8 - Moving heavy furniture. Rapidly climbing stairs. Carrying 20 pounds up stairs.  (-) pacemaker, hypertension, pulmonary hypertension, valvular problems/murmurs, past MI, CAD, cardiomyopathy, CABG/stent, dysrhythmias, angina, CHF, orthopnea, PND.    Neuro/Muscoloskeletal/Psych:      (-) seizures, neuromuscular disease, TIA, CVA, headaches, psychiatric history, arthritis, back problems.   Comments: osteoporosis    Pulmonary:    Sleep apnea (not currently using CPAP).    (-) no pneumonia, COPD, asthma, shortness of breath, recent URI, no active tuberculosis.       GI/Hepatic/Renal:    (+) liver disease (fatty liver).  GERD is well controlled.  Chronic renal disease (kidney stones).  No bowel prep.  (-) no hiatal hernia, PUD, hepatitis, no difficulty swallowing, no end stage liver disease.    Endo/Other:        (-) diabetes mellitus, hypothyroidism, hyperthyroidism, no anemia, no thrombocytopenia, no bleeding disorder. No opioid use      PAST MEDICAL HISTORY:  Past Medical History   Diagnosis Date   ??? Hyperlipidemia    ??? Kidney stones    ??? Depression    ??? Dyslipidemia    ??? Sleep apnea 06/17/2013     not using C-PAP   ??? GERD (gastroesophageal reflux disease)    ??? Constipation 06/17/2013     IIBS       PAST SURGICAL HISTORY:  Past Surgical History   Procedure  Laterality Date   ??? Tonsillectomy       in the past   ??? Salpingoophorectomy  2004     Hysty/BSO-etopic pregnancy/prolapse   ??? Sinus surgery       hx sinus surg x4   ??? Mid urethral sling   2002     at time of [facility]   ??? Esophagogastroduodenoscopy N/A 05/29/2012     Procedure: EGD;  Surgeon: Langley Adie, MD;  Location: [facility];  Service: Gastroenterology;  Laterality: N/A;   ??? Colonoscopy N/A 05/29/2012     Procedure: COLONOSCOPY W/ OR W/O BIOPSY;  Surgeon: Langley Adie, MD;  Location: [facility];  Service: Gastroenterology;  Laterality: N/A;  limited colonoscopy.   ??? Colonoscopy N/A 06/27/2012     Procedure: COLONOSCOPY W/ OR W/O BIOPSY;  Surgeon: Langley Adie, MD;  Location: [facility];  Service: Gastroenterology;  Laterality: N/A;  colonoscopy   ??? Hysterectomy  0220     Hysty/BSO 2004-etopic pregnancy/prolapse       FAMILY HISTORY:  Family History   Problem Relation Age of Onset   ??? Diabetes Mother    ??? Hypertension Mother    ??? Coronary artery disease Mother    ??? Heart disease Mother      Triple Bypass   ??? COPD Mother    ??? Hyperlipidemia Mother    ???  Coronary artery disease Father    ??? Hyperlipidemia Father    ??? COPD Father    ??? Heart failure Father      CHF   ??? Emphysema Father    ??? Other Sister      Blood Disorder   ??? COPD Sister    ??? Heart attack Brother    ??? COPD Brother    ??? Heart disease Brother      1 brother -Triple Bypass   ??? Other Brother      Heart Defect, Open heart surgery   ??? Breast cancer Other    ??? COPD Other    ??? Breast cancer Other    ??? Breast cancer Paternal Aunt        SOCIAL HISTORY:  History     Social History   ??? Marital Status: Divorced     Spouse Name: N/A     Number of Children: N/A   ??? Years of Education: N/A     Occupational History   ??? Not on file.     Social History Main Topics   ??? Smoking status: Former Smoker   ??? Smokeless tobacco: Never Used      Comment: 02/23/2009, no passive smoke exposure 04/15/2009   ??? Alcohol Use: No      Comment: Rare 04-02-13   ??? Drug Use: No      Comment:  04-02-13   ??? Sexual Activity: No     Other Topics Concern   ??? Caffeine Use Yes   ??? Occupational Exposure No   ??? Exercise No   ??? Seat Belt Yes     Social History Narrative   ??? No narrative on file       MEDICATIONS:  No current facility-administered medications on file prior to encounter.     Current Outpatient Prescriptions on File Prior to Encounter   Medication Sig Dispense Refill   ??? acyclovir (ZOVIRAX) 5 % cream Apply topically 5 times daily as needed.  15 g  1   ??? alendronate (FOSAMAX) 70 MG tablet TAKE 1 TABLET BY MOUTH EVERY 7 DAYS  12 tablet  3   ??? atorvastatin (LIPITOR) 40 MG tablet Take 1 tablet (40 mg total) by mouth daily.  30 tablet  5   ??? cetirizine (ZYRTEC) 10 MG tablet Take 10 mg by mouth daily.       ??? ergocalciferol (VITAMIN D2) 50,000 unit capsule TAKE 1 CAPSULE BY MOUTH WEEKLY- VIT D #3       ??? glucosamine-chondroitin 500-400 mg tablet Take 1 tablet by mouth 3 times a day.       ??? magnesium 250 mg Tab Take by mouth.       ??? montelukast (SINGULAIR) 10 mg tablet Take 1 tablet (10 mg total) by mouth at bedtime.  30 tablet  3   ??? omega-3 fatty acids-fish oil (FISH OIL) 360-1,200 mg Cap Take 1 capsule (1,200 mg total) by mouth daily.  30 capsule  5   ??? omeprazole (PRILOSEC) 40 MG capsule TAKE ONE CAPSULE BY MOUTH DAILY  30 capsule  11   ??? SUMAtriptan (IMITREX) 100 MG tablet Take one tablet po daily as needed for migraine, may repeat in 2 hours if needed in 24 hours.  12 tablet  2       ALLERGIES:  Allergies   Allergen Reactions   ??? No Known Allergies    ??? Crestor [Rosuvastatin] Other (See Comments)     Causes tightening in leg  muscles.       VITAL SIGNS:  Wt Readings from Last 3 Encounters:   06/17/13 205 lb (92.987 kg)   06/17/13 205 lb (92.987 kg)   06/11/13 205 lb (92.987 kg)     Ht Readings from Last 3 Encounters:   06/17/13 5' 3.75 (1.619 m)   06/17/13 5' 3.75 (1.619 m)   06/11/13 5' 3.75 (1.619 m)     Temp Readings from Last 3 Encounters:   06/11/13 97.2 ??F (36.2 ??C) Oral   05/17/13 96.9 ??F  (36.1 ??C) Oral   05/03/13 98.8 ??F (37.1 ??C) Oral     BP Readings from Last 3 Encounters:   06/11/13 135/77   06/04/13 147/70   05/17/13 137/60     Pulse Readings from Last 3 Encounters:   06/11/13 72   06/04/13 78   05/17/13 82     SpO2 Readings from Last 3 Encounters:   06/11/13 99%   05/17/13 97%   06/27/12 99%       Physical Exam:    Airway:     Mallampati: II  Mouth Opening: >2 FB  TM distance: > = 3 FB  Neck ROM: full  (-) no facial hair, neck not short      Dental:            Pulmonary:   - normal exam    Breath sounds clear to auscultation.       Cardiovascular:  - normal exam   Rhythm: regular  Rate: normal    Neuro/Musculoskeletal/Psych:  - normal neurological exam.   Mental status: alert and oriented to person, place and time.      Motor strength is normal.      Abdominal:    - normal exam    Current OB Status:       Other Findings:        LAB RESULTS:  Lab Results   Component Value Date    WBC 9.9 06/12/2013    HGB 13.0 06/12/2013    HCT 38.2 06/12/2013    MCV 89.8 06/12/2013    PLT 283 06/12/2013       No results found for this basename: ABORH       Lab Results   Component Value Date    GLUCOSE 88 06/11/2013    BUN 15 06/11/2013    CO2 24 06/11/2013    CREATININE 0.60 06/11/2013    K 4.5 06/11/2013    NA 137 06/11/2013    CL 106 06/11/2013    CALCIUM 10.1 06/11/2013    ALBUMIN 4.4 06/11/2013    PROT 7.4 04/02/2013    ALKPHOS 60 04/02/2013    ALT 16 04/02/2013    AST 18 04/13/2011    BILITOT 0.4 04/02/2013       No results found for this basename: PT, PTT, INR       No results found for this basename: PREGTESTUR, PREGSERUM, HCG, HCGQUANT       Anesthesia Plan:    ASA 2     Anesthesia Type:  general endotracheal.   (PIV, PACU postop.  IV opioids and tylenol for postop pain)  Intravenous induction.    Anesthetic plan and risks discussed with patient and family.    Plan, alternatives, and risks of anesthesia, including death, have been explained to and discussed with the patient/legal guardian.  By my assessment, the patient/legal guardian  understands and agrees.  Scenario presented in detail.  Questions answered.    Use of blood  products discussed with patient whom.   Plan discussed with CRNA and attending.

## 2013-06-20 MED ORDER — oxyCODONE (ROXICODONE) immediate release tablet 10 mg
5 | ORAL | Status: AC | PRN
Start: 2013-06-20 — End: 2013-06-21

## 2013-06-20 MED ORDER — oxyCODONE (ROXICODONE) immediate release tablet 5 mg
5 | ORAL | Status: AC | PRN
Start: 2013-06-20 — End: 2013-06-21

## 2013-06-20 MED ORDER — acetaminophen (TYLENOL) tablet 650 mg
325 | Freq: Four times a day (QID) | ORAL | Status: AC | PRN
Start: 2013-06-20 — End: 2013-06-21
  Administered 2013-06-20 – 2013-06-21 (×4): 650 mg via ORAL

## 2013-06-20 MED ORDER — acetaminophen (TYLENOL) tablet 650 mg
325 | Freq: Four times a day (QID) | ORAL | Status: AC | PRN
Start: 2013-06-20 — End: 2013-06-20

## 2013-06-20 MED ORDER — acetaminophen (TYLENOL) 325 MG tablet
325 | ORAL | Status: AC
Start: 2013-06-20 — End: 2013-06-20
  Administered 2013-06-20: 10:00:00 650 via ORAL

## 2013-06-20 MED FILL — CYCLOBENZAPRINE 5 MG TABLET: 5 5 MG | ORAL | Qty: 1

## 2013-06-20 MED FILL — TYLENOL 325 MG TABLET: 325 325 mg | ORAL | Qty: 2

## 2013-06-20 MED FILL — CETIRIZINE 10 MG TABLET: 10 10 MG | ORAL | Qty: 1

## 2013-06-20 MED FILL — SINGULAIR 10 MG TABLET: 10 10 mg | ORAL | Qty: 1

## 2013-06-20 MED FILL — ATORVASTATIN 40 MG TABLET: 40 40 MG | ORAL | Qty: 1

## 2013-06-20 MED FILL — FISH OIL 340 MG-1,000 MG CAPSULE: 340-1000 340-1,000 mg | ORAL | Qty: 1

## 2013-06-20 MED FILL — PANTOPRAZOLE 40 MG TABLET,DELAYED RELEASE: 40 40 MG | ORAL | Qty: 1

## 2013-06-20 NOTE — Unmapped (Signed)
Plastic Surgery Progress Note     Date: 06/20/2013     Hospital Day # 1    ID: Sarah Huang is POD 1 s/p s/p bilateral total mastectomy, R axillary SLNB, and placement of bilateral TEs with plastic surgery.       Interval Events:  - No acute events overnight      I/O:  I/O last 3 completed shifts:  In: 1940 [P.O.:440; I.V.:1000; IV Piggyback:500]  Out: 3870 [Urine:3530; Drains:265; Blood:75]    Vitals:  Temp:  [96.5 ??F (35.8 ??C)-98 ??F (36.7 ??C)] 98 ??F (36.7 ??C)  Heart Rate:  [55-99] 72  Resp:  [6-20] 18  BP: (91-138)/(45-73) 117/51 mmHg  FiO2:  [52 %-100 %] 100 %  Temp Readings from Last 1 Encounters:   06/20/13 98 ??F (36.7 ??C) Oral       Physical Exam:  Gen: Resting comfortably in bed   CV: RRR, intact distal perfusion   Resp: Breathing comfortably. Lungs CTAB   Chest: Bilateral breast incisions c/d/i with steri strips and gauze in place. Support bra in place. 4 JP drains (2R, 2L) holding suction, small amount of sanguinous drainage, sites c/d/i. No evidence of skin ischemia or necrosis.  No hematoma          Meds:  Scheduled Meds:  ??? atorvastatin  40 mg Oral Daily 0900   ??? cetirizine  10 mg Oral Daily 0900   ??? cyclobenzaprine  5 mg Oral TID   ??? fish oil-omega-3 fatty acids  1,000 mg Oral Daily 0900   ??? montelukast  10 mg Oral Nightly (2100)   ??? pantoprazole  40 mg Oral DAILY 0600     Continuous Infusions:   PRN Meds:acetaminophen, HYDROcodone-acetaminophen, HYDROcodone-acetaminophen, ondansetron, ondansetron, SUMAtriptan    Labs:  No results found for this basename: WBC, HGB, HCT, PLATELET, MCV, PT, APTT, INR, PTT, FIBRINOGEN,  in the last 72 hours  No results found for this basename: BUN, CREATININEL, NALEVEL, POTASSIUML, CHLORIDELEL, CO2LEVEL, GLUCOSE, CALCIUM, PHOSPHOR, MAGNESIUM,  in the last 72 hours    A/P: ROSIA Huang is s/p  s/p bilateral total mastectomy, R axillary SLNB, and placement of bilateral TEs with plastic surgery.  -pt c/o drowsiness with narcotics.  Would prefer tylenol  -home health  for drain care  -d/c foley  -reg diet  -HLIVF    Vicki Mallet  Plastic Surgery - [phone]

## 2013-06-20 NOTE — Unmapped (Signed)
Per MD order, foley catheter removed.  Approximately 8ml removed from balloon.  Patient tolerated well.  Advised to let us know when voids, placed hat in bathroom.  Will continue to monitor.

## 2013-06-20 NOTE — Unmapped (Signed)
Pt admitted to 5150 from PACU. A&O X4, oriented to room and call light. Daughter at bedside. Support bra C/D/I with JP X4 draining. Pt has no c/o pain. IVF infusing per MD order. Currently resting comfortably with no needs or concerns at this time, CLWR, will continue to monitor.    Gladstone Pih, RN

## 2013-06-20 NOTE — Unmapped (Signed)
I did not see or examine the patient. I discussed with the resident and agree with Dr. Roderic Ovens findings and plan as documented in the resident's note.    Cristal Generous, MD

## 2013-06-20 NOTE — Unmapped (Signed)
Surgery Progress Note    Patient: Sarah Huang  Admit Date: 06/19/2013  OR Date: 06/19/2013    Subjective:   Patient seen and examined this morning. No acute events overnight. AFVSS. Pain is well-controlled- notes worsening with movement, but still mostly controlled with tylenol. Doesn't like flexeril as it makes her tired. Denies n/v. No additional voiced complaints this morning.    Objective:   Vitals:  Temp:  [96.5 ??F (35.8 ??C)-98 ??F (36.7 ??C)] 97.1 ??F (36.2 ??C)  Heart Rate:  [55-99] 66  Resp:  [6-20] 18  BP: (91-138)/(45-73) 111/61 mmHg  FiO2:  [52 %-100 %] 100 %    Date 06/19/13 0700 - 06/20/13 0659 06/20/13 0700 - 06/21/13 0659   Shift 0700-1459 1500-2259 2300-0659 24 Hour Total 0700-1459 1500-2259 2300-0659 24 Hour Total   I  N  T  A  K  E   P.O.  240 200 440          P.O.  240 200 440        I.V.  (mL/kg) 1000  (10.8)   1000  (10.8)          Volume (mL) (lactated ringers infusion) 1000   1000        IV Piggyback 500   500          Volume (mL) (albumin human bottle 5%) 500   500        Shift Total  (mL/kg) 1500  (16.1) 240  (2.6) 200  (2.2) 1940  (20.9)       O  U  T  P  U  T   Urine  (mL/kg/hr) 180  (0.2) 1350  (1.8) 2000  (2.7) 3530  (1.6) 525   525      Urine 180  1000 1180          Output (mL) ([REMOVED] Urethral Catheter Straight-tip;Non-latex 16 Fr.)  1350 1000 2350 525   525    Drains  215 50 265          Output (mL) (Drain 1 Breast Right)  20 5 25           Output (mL) (Drain 2 Breast Right)  90 18 108          Output (mL) (Drain 1 Breast Left)  35 6 41          Output (mL) (Drain 2 Breast Left)  70 21 91        Blood 75   75          Est Blood Loss 75   75        Shift Total  (mL/kg) 255  (2.7) 1565  (16.8) 2050  (22) 3870  (41.6) 525  (5.6)   525  (5.6)   Weight (kg) 93 93 93 93 93 93 93 93       Physical Exam:  Gen: Alert and oriented x3, no acute distress  CV: Regular rate and rhythm  Resp: No respiratory distress  Abd: Soft, non-distended, nontender  Chest: bilateral breasts absent, no palpable  fluid collections, expected post-op swelling, JP drains x4 serosanguinous  Incision: c/d/i and covered by steri-strips      Labs:  No results found for this basename: WBC, HGB, HCT, PLT,  in the last 72 hours  No results found for this basename: NA, K, CL, CO2, BUN, CREATININE, GLUCOSE, CALCIUM, MG, PHOS,  in the last 72 hours    Current Medications:  Scheduled Meds:  ??? atorvastatin  40 mg Oral Daily 0900   ??? cetirizine  10 mg Oral Daily 0900   ??? cyclobenzaprine  5 mg Oral TID   ??? fish oil-omega-3 fatty acids  1,000 mg Oral Daily 0900   ??? montelukast  10 mg Oral Nightly (2100)   ??? pantoprazole  40 mg Oral DAILY 0600     Continuous Infusions:   PRN Meds: acetaminophen, HYDROcodone-acetaminophen, HYDROcodone-acetaminophen, ondansetron, ondansetron, SUMAtriptan    Assessment/Plan:   Sarah Huang is a 58 y.o. female s/p bilateral mastectomy, R SLNB, and tissue expander placement on 06/19/2013.    - Foley ordered to be removed, f/u on void check (patient previously had difficulties voiding following hysterectomy)  - Discussed with patient that she may choose tylenol or Norco based on the severity of her pain but that she needs to adequately control it enough to ambulate and do activities of daily living. She voiced understanding. She also was educated that she can refuse scheduled flexeril or have it on a prn basis so long as pain remains controlled.  - Patient thinks that she would be able to care for drains at home (she is a Engineer, civil (consulting) at [employer]) with assistance of daugher. Will place order for drain teaching. Patient can meet with Social Work to discuss HHC options if she chooses.  - Patient ready for discharge from a Breast Surgery standpoint. Pt thinks that she would like one more day in the hospital while addressing pain control. Will defer to Plastic Surgery team on disposition.    Angus Palms, MD  Breast Surgery  Pager [phone]

## 2013-06-21 MED ORDER — oxyCODONE (ROXICODONE) 5 MG immediate release tablet
5 | ORAL_TABLET | ORAL | 0.00 refills | 6.00000 days | Status: AC | PRN
Start: 2013-06-21 — End: 2013-07-23

## 2013-06-21 MED ORDER — acetaminophen (TYLENOL) 325 MG tablet
325 | ORAL_TABLET | Freq: Four times a day (QID) | ORAL | 0.00 refills | 11.00000 days | Status: AC | PRN
Start: 2013-06-21 — End: 2014-10-30

## 2013-06-21 MED ORDER — cyclobenzaprine (FLEXERIL) 5 MG tablet
5 | ORAL_TABLET | Freq: Three times a day (TID) | ORAL | Status: AC
Start: 2013-06-21 — End: 2013-07-05

## 2013-06-21 MED FILL — PANTOPRAZOLE 40 MG TABLET,DELAYED RELEASE: 40 40 MG | ORAL | Qty: 1

## 2013-06-21 MED FILL — TYLENOL 325 MG TABLET: 325 325 mg | ORAL | Qty: 2

## 2013-06-21 NOTE — Unmapped (Signed)
Plastic Surgery Progress Note     Date: 06/21/2013     Hospital Day # 2    ID: Sarah Huang is POD 2 s/p s/p bilateral total mastectomy, R axillary SLNB, and placement of bilateral TEs with plastic surgery.       Interval Events:  - No acute events overnight  - Pain controlled, tolerating diet  - Doing well this AM    I/O:  I/O last 3 completed shifts:  In: 320 [P.O.:320]  Out: 3534 [Urine:3125; Drains:409]    Vitals:  Temp:  [97 ??F (36.1 ??C)-97.8 ??F (36.6 ??C)] 97.6 ??F (36.4 ??C)  Heart Rate:  [59-72] 67  Resp:  [16-20] 16  BP: (108-132)/(53-66) 127/57 mmHg  Temp Readings from Last 1 Encounters:   06/21/13 97.6 ??F (36.4 ??C) Oral       Physical Exam:  Gen: Resting comfortably in bed   CV: RRR, intact distal perfusion   Resp: Breathing comfortably. Lungs CTAB   Chest: Bilateral breast incisions c/d/i with steri strips and gauze in place. Support bra in place. 3 JP drains (2R, 2L) holding suction s/p removal of R1 drain, small amount of sanguinous drainage, sites c/d/i. No evidence of skin ischemia or necrosis.  No hematoma      Meds:  Scheduled Meds:  ??? atorvastatin  40 mg Oral Daily 0900   ??? cetirizine  10 mg Oral Daily 0900   ??? cyclobenzaprine  5 mg Oral TID   ??? fish oil-omega-3 fatty acids  1,000 mg Oral Daily 0900   ??? montelukast  10 mg Oral Nightly (2100)   ??? pantoprazole  40 mg Oral DAILY 0600     Continuous Infusions:   PRN Meds:acetaminophen, ondansetron, ondansetron, oxyCODONE, oxyCODONE, SUMAtriptan    Labs:  No results found for this basename: WBC, HGB, HCT, PLATELET, MCV, PT, APTT, INR, PTT, FIBRINOGEN,  in the last 72 hours  No results found for this basename: BUN, CREATININEL, NALEVEL, POTASSIUML, CHLORIDELEL, CO2LEVEL, GLUCOSE, CALCIUM, PHOSPHOR, MAGNESIUM,  in the last 72 hours    A/P: Sarah Huang is s/p  s/p bilateral total mastectomy, R axillary SLNB, and placement of bilateral TEs with plastic surgery.  -s/p removal of R1 drain today --- still with 3 JP drains in place  -Pt and daughter  would not like HHC as they feel they can take of drains themselves --- bedside RN to complete drain teaching prior to d/c  -Dispo: home today  -f/u with Dr. Jens Som at [facility] on Tuesday 06/25/2013 --- please have Pt call [phone] to confirm appointment.    Pricilla Holm, MD, MS  Plastic Surgery - [phone]  06/21/2013  1:51 PM

## 2013-06-21 NOTE — Unmapped (Signed)
PLASTIC SURGERY DISCHARGE INSTRUCTIONS    General Information:    You will have a clear glue or steri-strips covered with gauze and adhesive dressing/tape on your incision. A small amount of blood may be present - this is normal.    If you have a dressing:  X    Remove it two days after surgery, leave steri-strips in place    Keep it in place until first post-op visit    You may shower (do not scrub at incisions, okay to allow water to run over them):  X   Two days after surgery    You may not shower, may only bathe with incisions and drains covered.    Once you have showered, pat incisions dry and leave steri-strips/glue in place.    No submersing in bath, hot tub, swimming pool, or lake for 6 weeks.    Do not use anti-perspirant under your arm for at least 2 weeks if you have an incision here. Baby powder and deodorant are okay.    Ice packs may be applied to the incisions for 20 minutes per hour for 48 hours while awake to help with pain and swelling.    Drain Care:    The drain site should be cleaned and re-dressed daily (more frequently if needed). It is normal for the drain site to have a small amount of surrounding redness (1-2 cm) and drainage (similar to drainage in JP bulb).    Before you shower (if okay with your surgeon), remove the drain dressing. Pin drains to a string or shoelace tied loosely around your neck to support the drains while you are undressed. Shower. Pat drain sites dry and apply a topical antibiotic and reapply a new dressing (gauze/tape or band-aid).    Your drain will remain in place until it is draining less than 30 ml per day or your surgeon feels that it must be removed.    Medications:    Take your pain medication (usually Vicodin or Percocet) as directed. Take with food to minimize nausea and vomiting.     You may also take the additional medications if approved by your surgeon:    Acetaminophen 500 mg every 6 hours as needed for pain (do not take WITH another pain medication that  contains acetaminophen).    Ibuprofen 600 mg four times a day with food.    If you have been prescribed antibiotics, it is important to take them until they are finished.    Activities:    You may resume light activities and a regular diet. Do not lift anything heavier than a half-gallon of milk for at least 2 weeks.    Do not drive   X    While taking pain medicine.    While drains are in place.    You may return to work in approximately:    1-2 days.  X    2-4 weeks or longer (after follow up with your surgeon).    CALL THE CLINIC [phone]) OR YOUR SURGEON [phone]) IF YOU EXPERIENCE ANY OF THE FOLLOWING:    If your incision(s) becomes increasingly red, sore/painful, swollen, or warm to the touch.    If you develop a fever greater than 101.5.    If you experience chills, nausea, and/or vomiting.    If your incision starts to drain large amounts of purulent fluid or blood.    If you need medical assistance:    Please call the clinic at [phone] or call [phone] and ask for your plastic surgery team. They will address concerns about your drains, wound care, and antibiotics.

## 2013-06-21 NOTE — Unmapped (Signed)
Surgery Progress Note    Patient: Sarah Huang  Admit Date: 06/19/2013  OR Date: 06/19/2013    Subjective:   Patient seen and examined this morning. No acute events overnight. AFVSS. Pain is well-controlled though notes it limits ability to abduct arms fully. Denies n/v. No additional voiced complaints this morning.    Objective:   Vitals:  Temp:  [97 ??F (36.1 ??C)-97.8 ??F (36.6 ??C)] 97.8 ??F (36.6 ??C)  Heart Rate:  [64-72] 64  Resp:  [16-20] 20  BP: (89-132)/(53-61) 132/57 mmHg    Date 06/20/13 0700 - 06/21/13 0659 06/21/13 0700 - 06/22/13 0659   Shift 0700-1459 1500-2259 2300-0659 24 Hour Total 0700-1459 1500-2259 2300-0659 24 Hour Total   I  N  T  A  K  E   P.O. 120   120          P.O. 120   120        Shift Total  (mL/kg) 120  (1.3)   120  (1.3)       O  U  T  P  U  T   Urine  (mL/kg/hr) 825  (1.1) 300  (0.4)  1125          Urine 300 300  600          Output (mL) ([REMOVED] Urethral Catheter Straight-tip;Non-latex 16 Fr.) 525   525        Drains 62 102 85 249          Output (mL) (Drain 1 Breast Right) 6 7 10 23           Output (mL) (Drain 2 Breast Right) 28 45 35 108          Output (mL) (Drain 1 Breast Left) 7 10 30  47          Output (mL) (Drain 2 Breast Left) 21 40 10 71        Shift Total  (mL/kg) 887  (9.5) 402  (4.3) 85  (0.9) 1374  (14.8)       Weight (kg) 93 93 93 93 93 93 93 93       Physical Exam:  Gen: Alert and oriented x3, no acute distress  CV: Regular rate and rhythm  Resp: No respiratory distress  Abd: Soft, non-distended, nontender  Chest: bilateral breasts absent, expected post-op swelling, JP drains x4 serosanguinous  Incision: c/d/i and covered by steri-strips      Labs:  No results found for this basename: WBC, HGB, HCT, PLT,  in the last 72 hours  No results found for this basename: NA, K, CL, CO2, BUN, CREATININE, GLUCOSE, CALCIUM, MG, PHOS,  in the last 72 hours    Current Medications:  Scheduled Meds:  ??? atorvastatin  40 mg Oral Daily 0900   ??? cetirizine  10 mg Oral Daily 0900   ???  cyclobenzaprine  5 mg Oral TID   ??? fish oil-omega-3 fatty acids  1,000 mg Oral Daily 0900   ??? montelukast  10 mg Oral Nightly (2100)   ??? pantoprazole  40 mg Oral DAILY 0600     Continuous Infusions:   PRN Meds: acetaminophen, ondansetron, ondansetron, oxyCODONE, oxyCODONE, SUMAtriptan    Assessment/Plan:   Sarah Huang is a 58 y.o. female s/p bilateral mastectomy, R SLNB, and tissue expander placement on 06/19/2013.    - D/c to home. F/u appt scheduled with Breast Surgery for 0945 on 07/02/13. Drain plan per [employer].    Erline Siddoway  Carlyon Prows, MD  Breast Surgery  Pager [phone]

## 2013-06-22 NOTE — Unmapped (Signed)
Physician Discharge Summary     Patient ID:  Sarah Huang  [id number]  1955/03/04    Admit date: 06/19/2013    Discharge date: 06/21/2013    Attending Physician: Cristal Generous, MD     Admission Diagnoses:   breast cancer    Discharge Diagnoses:   Active Problems:    S/P bilateral mastectomy    Breast cancer, female    Past Medical History   Diagnosis Date   ??? Hyperlipidemia    ??? Kidney stones    ??? Depression    ??? Dyslipidemia    ??? Sleep apnea 06/17/2013     not using C-PAP   ??? GERD (gastroesophageal reflux disease)    ??? Constipation 06/17/2013     IIBS       Indication for Admission: Sarah Huang is a 58 y.o. woman recently diagnosed with multicentric right breast cancer. After evaluation and consultation she elected bilateral skin sparing mastectomies and right sentinel lymph node biopsies followed by placement of bilateral tissue expanders for reconstruction. Risks, benefits, and alternatives were discussed and she elected to proceed.      Operations/Procedures Performed:   bilateral total mastectomy   right axillary sentinel lymph node biopsy  placement of bilateral tissue expanders      Hospital Course: Patient admitted on 06/19/2013 and underwent abovementioned procedure(s) on 06/19/2013. Tolerated the procedure well with no complications. Please see full operative report for further details regarding the operation. Postoperatilvely transferred to PACU in stable condition. Pain controlled post-op with IV medications transitioned to oral. Foley removed on POD 1 and voided spontaneously. Diet was advanced and tolerated this well. At time of discharge, the patient was tolerating oral food and hydration, voiding spontaneously, had return of bowel function, was ambulating without difficulty, and pain was controlled on oral medications. The patient was determined to be suitable for discharge and the patient felt comfortable with that decision. Patient was discharged in good condition.    Consults: Breast Service,  Plastics Service      Discharge Exam:  Blood pressure 127/57, pulse 67, temperature 97.6 ??F (36.4 ??C), temperature source Oral, resp. rate 16, height 5' 3.75 (1.619 m), weight 205 lb (92.987 kg), SpO2 100.00%.    Gen: Resting comfortably in bed   CV: RRR, intact distal perfusion   Resp: Breathing comfortably. Lungs CTAB   Chest: Bilateral breast incisions c/d/i with steri strips and gauze in place. Support bra in place. 3 JP drains (2R, 2L) holding suction s/p removal of R1 drain, small amount of sanguinous drainage, sites c/d/i. No evidence of skin ischemia or necrosis. No hematoma      Disposition: Home    Discharge Medications:  Discharge Medication List as of 06/21/2013  1:47 PM      START taking these medications    Details   acetaminophen (TYLENOL) 325 MG tablet Take 2 tablets (650 mg total) by mouth every 6 hours as needed for Pain., Starting 06/21/2013, Until Discontinued, Print      cyclobenzaprine (FLEXERIL) 5 MG tablet Take 1 tablet (5 mg total) by mouth 3 times a day. Indications: Muscle Spasm, Starting 06/21/2013, Until Discontinued, Print      oxyCODONE (ROXICODONE) 5 MG immediate release tablet Take 1-2 tablets (5-10 mg total) by mouth every 4 hours as needed for Pain., Starting 06/21/2013, Until Discontinued, Print         CONTINUE these medications which have NOT CHANGED    Details   acyclovir (ZOVIRAX) 5 % cream Apply  topically 5 times daily as needed., Normal      alendronate (FOSAMAX) 70 MG tablet TAKE 1 TABLET BY MOUTH EVERY 7 DAYS, Normal      atorvastatin (LIPITOR) 40 MG tablet Take 1 tablet (40 mg total) by mouth daily., Starting 04/08/2013, Until Discontinued, Normal      cetirizine (ZYRTEC) 10 MG tablet Take 10 mg by mouth daily., Until Discontinued, Historical Med      ergocalciferol (VITAMIN D2) 50,000 unit capsule TAKE 1 CAPSULE BY MOUTH WEEKLY- VIT D #3, Historical Med      glucosamine-chondroitin 500-400 mg tablet Take 1 tablet by mouth 3 times a day., Until Discontinued, Historical Med       magnesium 250 mg Tab Take by mouth., Until Discontinued, Historical Med      montelukast (SINGULAIR) 10 mg tablet Take 1 tablet (10 mg total) by mouth at bedtime., Starting 11/20/2012, Until Discontinued, Normal      omega-3 fatty acids-fish oil (FISH OIL) 360-1,200 mg Cap Take 1 capsule (1,200 mg total) by mouth daily., Starting 11/20/2012, Until Discontinued, Normal      omeprazole (PRILOSEC) 40 MG capsule TAKE ONE CAPSULE BY MOUTH DAILY, Normal      SUMAtriptan (IMITREX) 100 MG tablet Take one tablet po daily as needed for migraine, may repeat in 2 hours if needed in 24 hours., Normal             Patient Instructions:  See attached instructions    Future Appointments  Date Time Provider Department Center   07/02/2013 9:45 AM Cristal Generous, MD [facility]   07/05/2013 2:45 PM Yolanda Manges, MD [facility]       [facility], MD  06/30/2013

## 2013-06-25 ENCOUNTER — Ambulatory Visit: Admit: 2013-06-25 | Discharge: 2013-06-25 | Payer: PRIVATE HEALTH INSURANCE

## 2013-06-25 DIAGNOSIS — C50919 Malignant neoplasm of unspecified site of unspecified female breast: Secondary | ICD-10-CM

## 2013-06-25 NOTE — Unmapped (Signed)
Post op visit  Procedure:Immediate Bilateral Tissue Expanders   Date: 06/19/13    Complaints: Has had some pain, required narcotic pain use last night. Describes neuropathic-like pain on the left and muscle spasms on the right    Findings:  General: Alert and appropriate.   R Breast: Incision CDI. Steri strips in place. No erythema, no fluid collection. No s/s of infection  L Breast: Incision CDI. Steri strips in place. No erythema, no fluid collection. No s/s of infection  Drains: Serous. Drain L2 removed on exam today    Appt Dates: OR Fill      Running Total   Right         Left           Assessment: On course    Plan:  -Will plan on follow-up on Friday if drains have put out <21mL over 24 hours. Otherwise, will be seeing Dr. Melvyn Neth on Tuesday and if ready, ok to have drains removed then  -Follow-up for another post-op check on June 26

## 2013-07-02 ENCOUNTER — Ambulatory Visit: Admit: 2013-07-02 | Discharge: 2013-07-02 | Payer: PRIVATE HEALTH INSURANCE

## 2013-07-02 DIAGNOSIS — C50919 Malignant neoplasm of unspecified site of unspecified female breast: Secondary | ICD-10-CM

## 2013-07-02 NOTE — Unmapped (Signed)
Sarah Huang,  Please schedule patient with Dr Graylon Good for breast cancer and notify patient of date and time of appt.  Thanks.

## 2013-07-02 NOTE — Unmapped (Signed)
History of Present Illness:   Sarah Huang is a 58 y.o. female who returns for post-op visit s/p bilateral skin-sparing mastectomies (for R multicentric ductal carcinoma and L prophylactic), R SLNB, and immediate reconstruction with bilateral tissue expanders by Dr. Jens Som on 06/19/2013. Has been doing well. Pain is generally improving, though still notes some R arm discomfort. Still has one drain on each side. Fatigue is improving as well. Denies fevers, chills, and night sweats.    HPI Breast History:   Race: caucasian   Ethnicity: No Ashkenazi [place], [place], or Amish ancestry   Age of Menarche:15   LMP: December 2002   Gravida 3 Para 2 Misc 1   Age at first childbirth:21   History of breast feeding: no   History of hormonal contraceptives:yes about 4 years   Age of Menopause: Total hysterectomy at 46   History of HRT: yes, 3 months premarin   Assisted Reproduction Treatments: no   Hereditary risk factors (pre-breast biopsy): no   Prior history of breast biopsy: 05-10-13   Family history of breast or ovarian cancer: yes, paternal aunt diagnosed with breast cancer age of diagnosis unknown, paternal cousin diagnosed with breast cancer at age 10   Family history of other cancers: yes, maternal aunt diagnosed with colon cancer at age 75, maternal aunt diagnosed with pancreatic cancer at age 39   Patient interested in future childbearing:no   Patient interested in meeting with a fertility specialist: no   Comments:      HPI            Histories:     She has a past medical history of Hyperlipidemia; Kidney stones; Depression; Dyslipidemia; Sleep apnea (06/17/2013); GERD (gastroesophageal reflux disease); and Constipation (06/17/2013).    She has past surgical history that includes Tonsillectomy; Salpingoophorectomy (2004); Sinus surgery; mid urethral sling  (2002); Esophagogastroduodenoscopy (N/A, 05/29/2012); Colonoscopy (N/A, 05/29/2012); Colonoscopy (N/A, 06/27/2012); Hysterectomy (0220); remove breast  tissue expanders insertion implants (Bilateral, 06/19/2013); dissection axillary (Right, 06/19/2013); and Breast surgery.    Her family history includes Breast cancer in her other, other, and paternal aunt; COPD in her brother, father, mother, other, and sister; Coronary artery disease in her father and mother; Diabetes in her mother; Emphysema in her father; Heart attack in her brother; Heart disease in her brother and mother; Heart failure in her father; Hyperlipidemia in her father and mother; Hypertension in her mother; Other in her brother and sister.    She reports that she has quit smoking. She has never used smokeless tobacco. She reports that she does not drink alcohol or use illicit drugs.      Review of Systems   Constitutional: Positive for fatigue. Negative for fever, weight loss, diaphoresis, activity change and appetite change.   HENT: Negative for congestion and sore throat.    Respiratory: Negative for cough, chest tightness and shortness of breath.    Cardiovascular: Negative for chest pain and palpitations.   Gastrointestinal: Negative for nausea, vomiting, abdominal pain and constipation.   Genitourinary: Negative for dysuria, urgency, frequency, decreased urine volume and difficulty urinating.   Musculoskeletal: Negative for arthralgias, back pain and neck pain.   Skin: Positive for wound (well-healing incisions).   Neurological: Positive for weakness. Negative for dizziness. Headaches: allergies    Psychiatric/Behavioral: Negative for depression, suicidal ideas and dysphoric mood. The patient is not nervous/anxious.        Allergies:   Crestor    Medications:     Outpatient Encounter Prescriptions as of  07/02/2013   Medication Sig Dispense Refill   ??? acetaminophen (TYLENOL) 325 MG tablet Take 2 tablets (650 mg total) by mouth every 6 hours as needed for Pain.  40 tablet  0   ??? acyclovir (ZOVIRAX) 5 % cream Apply topically 5 times daily as needed.  15 g  1   ??? alendronate (FOSAMAX) 70 MG tablet  TAKE 1 TABLET BY MOUTH EVERY 7 DAYS  12 tablet  3   ??? atorvastatin (LIPITOR) 40 MG tablet Take 1 tablet (40 mg total) by mouth daily.  30 tablet  5   ??? cetirizine (ZYRTEC) 10 MG tablet Take 10 mg by mouth daily.       ??? cyclobenzaprine (FLEXERIL) 5 MG tablet Take 1 tablet (5 mg total) by mouth 3 times a day. Indications: Muscle Spasm  15 tablet  0   ??? ergocalciferol (VITAMIN D2) 50,000 unit capsule TAKE 1 CAPSULE BY MOUTH WEEKLY- VIT D #3       ??? glucosamine-chondroitin 500-400 mg tablet Take 1 tablet by mouth 3 times a day.       ??? magnesium 250 mg Tab Take by mouth.       ??? montelukast (SINGULAIR) 10 mg tablet Take 1 tablet (10 mg total) by mouth at bedtime.  30 tablet  3   ??? omega-3 fatty acids-fish oil (FISH OIL) 360-1,200 mg Cap Take 1 capsule (1,200 mg total) by mouth daily.  30 capsule  5   ??? omeprazole (PRILOSEC) 40 MG capsule TAKE ONE CAPSULE BY MOUTH DAILY  30 capsule  11   ??? oxyCODONE (ROXICODONE) 5 MG immediate release tablet Take 1-2 tablets (5-10 mg total) by mouth every 4 hours as needed for Pain.  60 tablet  0   ??? SUMAtriptan (IMITREX) 100 MG tablet Take one tablet po daily as needed for migraine, may repeat in 2 hours if needed in 24 hours.  12 tablet  2     No facility-administered encounter medications on file as of 07/02/2013.        Objective:       Blood pressure 123/67, pulse 77, temperature 97 ??F (36.1 ??C), temperature source Oral, height 5' 3.75 (1.619 m), weight 199 lb (90.266 kg), SpO2 99.00%.    Physical Exam   Nursing note and vitals reviewed.  Constitutional: She is oriented to person, place, and time. She appears well-developed and well-nourished.   HENT:   Head: Normocephalic and atraumatic.   Eyes: Conjunctivae are normal. Pupils are equal, round, and reactive to light.   Neck: Normal range of motion. Neck supple.   Cardiovascular: Normal rate and regular rhythm.    Pulmonary/Chest: Effort normal and breath sounds normal. No respiratory distress.       Musculoskeletal: Normal range  of motion. She exhibits no edema.   Neurological: She is alert and oriented to person, place, and time.   Skin: Skin is warm and dry.   Psychiatric: She has a normal mood and affect. Her behavior is normal. Judgment and thought content normal.       Review of Lab Results:      Lab Results   Component Value Date    WBC 9.9 06/12/2013    HGB 13.0 06/12/2013    HCT 38.2 06/12/2013    PLT 283 06/12/2013    GLUCOSE 88 06/11/2013    GLUCOSE 86 10/08/2010    CREATININE 0.60 06/11/2013    NA 137 06/11/2013    K 4.5 06/11/2013    CL 106  06/11/2013    CO2 24 06/11/2013    GFRNONAFRAM 107 04/13/2011    BILITOT 0.4 04/02/2013    PROT 7.4 04/02/2013    PROT 7.2 09/20/2005    AST 18 04/13/2011    ALT 16 04/02/2013    ALKPHOS 60 04/02/2013       Imaging:       Pathology:   FINAL DIAGNOSIS 06/19/2013:    A. Left breast, mastectomy:  - Fibrocystic change with focal atypical ductal hyperplasia (ADH).  - No evidence of in situ or invasive carcinoma.  - Unremarkable nipple.  - Resection margins are free of ADH.    B. Right breast, mastectomy:  - Well differentiated (grade 1) invasive ductal carcinoma.  - No ductal carcinoma in situ (DCIS) identified.  - Margins are free of DCIS and invasive carcinoma.  - Two biopsy sites identified.  - Background proliferative fibrocystic changes.  - Unremarkable nipple.  - See synoptic report.  - Pathologic staging: pT1a, pN0(sn), pMX.    C. Right axillary sentinel lymph node #1, excision:  - One lymph node is negative for metastatic carcinoma (0/1) by HE and  immunostain.      Cancer Staging:   Breast cancer    Primary site: Breast (Right)    Staging method: AJCC 7th Edition    Clinical: Stage IA (T1b, N0, cM0) signed by Cristal Generous, MD on 05/17/2013  9:23 AM    Pathologic: Stage IA (T1a, N0(i-), cM0) signed by Cristal Generous, MD on 07/02/2013 10:34 AM    Summary: Stage IA (T1a, N0(i-), cM0)       Assessment:   58 y.o. 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    No clinical evidence of recurrent disease.    1. Surgery -   Date of surgery: 06/19/2013   Type of Surgery: Bilateral skin-sparing mastectomies, R SLNB, immediate reconstruction with tissue expanders by Dr. Jens Som    2. Systemic therapy - referred today to medical oncology  Oncotype Score(s):  Chemotherapy type & number of cycles planned:  Neoadjuvant vs. adjuvant chemotherapy:  Date chemotherapy started:  Date & number of cycles of chemotherapy completed:  Hormonal therapy recommended & type:  Date hormonal therapy started:  Date hormonal therapy stopped/completed    3. Radiation therapy  - not recommended    4. Plastic and reconstructive surgery - bilateral tissue expanders per Dr. Jens Som    5. Imaging - her imaging and reports have been reviewed. No further breast imaging indicated.    6. Genetics -she has seen genetics, testing results pending     7. Lymphedema - no issues    8. Range of motion - no issues    9. Fertility - post-menopausal    10. Follow up - return in 6 months and as needed for any new symptoms, breast changes, or concerns

## 2013-07-02 NOTE — Unmapped (Signed)
Message copied by Carney Living on Tue Jul 02, 2013 11:58 AM  ------       Message from: Leisa Lenz       Created: Tue Jul 02, 2013 10:58 AM       Regarding: New Consultation         Please call patient to schedule appointment with  Dr. Sumner Boast. Lucille Passy referring. Diag: Breast Cancer              Thanks       Andrey Campanile  ------

## 2013-07-02 NOTE — Unmapped (Signed)
Already scheduled.

## 2013-07-02 NOTE — Unmapped (Addendum)
.  Dr. Tresa Endo (medical oncology)  Ph# [phone]  [facility]   [address]  [place], [place]  [address]    Follow up in 6 months with Dr. Garnett Farm Combs  Administrative Assistant  Lucille Passy, MD  Silva Bandy, MD  Main Office Ph# [phone]  Line to leave voice message: [phone]  Fax# [phone]    Serita Butcher, RN  Office phone# [phone]  After Hours phone #[phone]    Hours of Operation  Monday through Friday  8 am to 5 pm

## 2013-07-02 NOTE — Unmapped (Signed)
Navigation visit 07/02/13  Met with this patient following her visit with Dr.Lewis.  Plan includes referral to Dr. Graylon Good for Medical Oncology systemic management.  Patient is s/p bilateral total mastectomy with Right ax SN LN bx on 06/19/13.  Healing well without any complaints. Discussed with patient what to expect at her visit with Dr. Graylon Good, where her clinic is located, and answered all questions.  Have spoken to this patient on the phone d/t original visit at [facility], but plans to continue treatment at the [facility] location.  Patient will be seen by Dr. Jens Som on 07/05/13.  Next visit with Dr. Melvyn Neth in 6 months (01/14/14).

## 2013-07-05 ENCOUNTER — Ambulatory Visit: Admit: 2013-07-05 | Payer: PRIVATE HEALTH INSURANCE

## 2013-07-05 ENCOUNTER — Ambulatory Visit: Admit: 2013-07-05 | Discharge: 2013-07-05 | Payer: PRIVATE HEALTH INSURANCE

## 2013-07-05 DIAGNOSIS — C50919 Malignant neoplasm of unspecified site of unspecified female breast: Secondary | ICD-10-CM

## 2013-07-05 MED ORDER — cyclobenzaprine (FLEXERIL) 5 MG tablet
5 | ORAL_TABLET | Freq: Three times a day (TID) | ORAL | Status: AC | PRN
Start: 2013-07-05 — End: 2013-09-27

## 2013-07-05 NOTE — Unmapped (Addendum)
oncotype score testing to be sent.   Discussed aromatase inhibitors and tamoxifen as adjuvant hormonal blockade for this T1b tumor. However due to osteoporosis at baseline, would consider tamoxifen x 5 yrs daily.   Went over risk factors for breast cancer and risk reduction including not smoking, avoiding execcsive ETOH use, role of Aspirin, exercise, maintaining ideal BMI  and avoiding any hormonal supplements.    Going to see plastics today.       Tamoxifen oral tablet  What is this medicine?  TAMOXIFEN (ta MOX i fen) blocks the effects of estrogen. It is commonly used to treat breast cancer. It is also used to decrease the chance of breast cancer coming back in women who have received treatment for the disease. It may also help prevent breast cancer in women who have a high risk of developing breast cancer.  This medicine may be used for other purposes; ask your health care provider or pharmacist if you have questions.  COMMON BRAND NAME(S): Nolvadex  What should I tell my health care provider before I take this medicine?  They need to know if you have any of these conditions:  -blood clots  -blood disease  -cataracts or impaired eyesight  -endometriosis  -high calcium levels  -high cholesterol  -irregular menstrual cycles  -liver disease  -stroke  -uterine fibroids  -an unusual or allergic reaction to tamoxifen, other medicines, foods, dyes, or preservatives  -pregnant or trying to get pregnant  -breast-feeding  How should I use this medicine?  Take this medicine by mouth with a glass of water. Follow the directions on the prescription label. You can take it with or without food. Take your medicine at regular intervals. Do not take your medicine more often than directed. Do not stop taking except on your doctor's advice.  A special MedGuide will be given to you by the pharmacist with each prescription and refill. Be sure to read this information carefully each time.  Talk to your pediatrician regarding the use  of this medicine in children. While this drug may be prescribed for selected conditions, precautions do apply.  Overdosage: If you think you have taken too much of this medicine contact a poison control center or emergency room at once.  NOTE: This medicine is only for you. Do not share this medicine with others.  What if I miss a dose?  If you miss a dose, take it as soon as you can. If it is almost time for your next dose, take only that dose. Do not take double or extra doses.  What may interact with this medicine?  -aminoglutethimide  -bromocriptine  -chemotherapy drugs  -female hormones, like estrogens and birth control pills  -letrozole  -medroxyprogesterone  -phenobarbital  -rifampin  -warfarin  This list may not describe all possible interactions. Give your health care provider a list of all the medicines, herbs, non-prescription drugs, or dietary supplements you use. Also tell them if you smoke, drink alcohol, or use illegal drugs. Some items may interact with your medicine.  What should I watch for while using this medicine?  Visit your doctor or health care professional for regular checks on your progress. You will need regular pelvic exams, breast exams, and mammograms. If you are taking this medicine to reduce your risk of getting breast cancer, you should know that this medicine does not prevent all types of breast cancer. If breast cancer or other problems occur, there is no guarantee that it will be found at an  early stage.  Do not become pregnant while taking this medicine or for 2 months after stopping this medicine. Stop taking this medicine if you get pregnant or think you are pregnant and contact your doctor. This medicine may harm your unborn baby. Women who can possibly become pregnant should use birth control methods that do not use hormones during tamoxifen treatment and for 2 months after therapy has stopped. Talk with your health care provider for birth control advice.  Do not breast feed  while taking this medicine.  What side effects may I notice from receiving this medicine?  Side effects that you should report to your doctor or health care professional as soon as possible:  -changes in vision (blurred vision)  -changes in your menstrual cycle  -difficulty breathing or shortness of breath  -difficulty walking or talking  -new breast lumps  -numbness  -pelvic pain or pressure  -redness, blistering, peeling or loosening of the skin, including inside the mouth  -skin rash or itching (hives)  -sudden chest pain  -swelling of lips, face, or tongue  -swelling, pain or tenderness in your calf or leg  -unusual bruising or bleeding  -vaginal discharge that is bloody, brown, or rust  -weakness  -yellowing of the whites of the eyes or skin  Side effects that usually do not require medical attention (report to your doctor or health care professional if they continue or are bothersome):  -fatigue  -hair loss, although uncommon and is usually mild  -headache  -hot flashes  -impotence (in men)  -nausea, vomiting (mild)  -vaginal discharge (white or clear)  This list may not describe all possible side effects. Call your doctor for medical advice about side effects. You may report side effects to FDA at 1-800-FDA-1088.  Where should I keep my medicine?  Keep out of the reach of children.  Store at room temperature between 20 and 25 degrees C (68 and 77 degrees F). Protect from light. Keep container tightly closed. Throw away any unused medicine after the expiration date.  NOTE: This sheet is a summary. It may not cover all possible information. If you have questions about this medicine, talk to your doctor, pharmacist, or health care provider.  ?? 2013, Elsevier/Gold Standard. (09/13/2007 12:01:56 PM)

## 2013-07-05 NOTE — Unmapped (Signed)
Post op visit    Procedure:Immediate placement of Bilateral tissue expanders     Date: 06/19/2013    Complaints:  None new    Findings:wounds fine, still w sig drain output    Appt Dates: OR Fill      Running Total   Right         Left           Assessment: on course    Plan:check in a week

## 2013-07-05 NOTE — Unmapped (Signed)
Navigation Visit  Met with patient today during her visit with Dr. Graylon Good for MED/ONC systemic management discussion.  Patient is s/p Bilateral total mastectomies with Right ax SN LN bx on 06/19/13. Patient also has Plastics follow up scheduled today following MED/ONC appt.  Reports she still has 2 drains intact and c/o TE being annoying.  Takes Flexeril in the morning d/t spasms from TE's and Oxycodone at bedtime to sleep.  Patient also reported that she is emotionally doing well, but over the past 2 weeks noticed that she cries very sporatically for no real reason.  Dr. Graylon Good felt the root of the issue is probably r/t addition of the 2 new medications.  Recommended she attempt to hold one medication at a time for 3 consecutive days to see if the tearful episodes resolve.  Plan for this patient includes an Oncotype.  If score is >30, then chemotherapy will be the plan.  An intermediate score will result in additional discussion with Dr. Graylon Good for decision making.  Also, discussed use of an A/I vs Tamoxifen.  Patient has a known Dx of Osteoporosis.  Plan is leaning toward use of Tamoxifen.  Oncotype to be sent and patient will return in about 3 weeks to discuss results and definitive plan on 08/02/13.  Patient verbalized understanding.

## 2013-07-05 NOTE — Unmapped (Signed)
Chief Complaint   Patient presents with   ??? New Patient Visit/ Consultation          History of Present Illness  Sarah Huang is a 58 y.o. female   Ref: Attending Provider Unknown  [address]  [place], [place] [address]    Dx:  Sarah Huang is a 58 y.o. female who presents due to newly diagnosed right breast cancer. She presented for screening mammography in early April where several changes were noted in both breasts. 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 The left breast lesion turned out to be a cyst which was aspirated and resolved.   She denies and breast changes, symptoms, or other concerns.   Patient is s/p Bilateral skin-sparing    total mastectomies (for R multicentric ductal carcinoma and L prophylactic),   with Right ax SN LN bx on 06/19/13.   and immediate reconstruction with bilateral tissue expanders by Dr. Jens Som    Patient also has Plastics follow up scheduled today following MED/ONC appt. Reports she still has 2 drains intact and c/o TE being annoying. Takes Flexeril in the morning d/t spasms from TE's and Oxycodone at bedtime to sleep. Patient also reported that she is emotionally doing well, but over the past 2 weeks noticed that she cries very sporatically for no real reason.     HPI Breast History:   Race: caucasian   Ethnicity: No Ashkenazi [place], [place], or Amish ancestry   Age of Menarche:15   LMP: December 2002   Gravida 3 Para 2 Misc 1   Age at first childbirth:21   History of breast feeding: no   History of hormonal contraceptives:yes about 4 years   Age of Menopause: Total hysterectomy at 46 with single oophorectomy. Had other ovary removed with an ectopic long ago. Essentially has no ovaries now.  History of HRT: yes, 3 months premarin   Assisted Reproduction Treatments: no   Hereditary risk factors (pre-breast biopsy): no   Prior history of breast biopsy: 05-10-13   Family  history of breast or ovarian cancer: yes, paternal aunt diagnosed with breast cancer age of diagnosis unknown, paternal cousin diagnosed with breast cancer at age 29   Family history of other cancers: yes, maternal aunt diagnosed with colon cancer at age 76, maternal aunt diagnosed with pancreatic cancer at age 20   Patient interested in future childbearing:no   Patient interested in meeting with a fertility specialist: no   Comments:     has had many sinus surgeryies x3   Has microscopic hematuria chronic  No other bleeding issues.   Short period of HRT few months only.          Review of Systems   Constitutional: Negative for fever, chills, diaphoresis, activity change, appetite change and fatigue.   HENT: Positive for congestion, postnasal drip and rhinorrhea.    Respiratory: Negative for cough, chest tightness and shortness of breath.    Cardiovascular: Negative for chest pain, palpitations and leg swelling.   Gastrointestinal: Negative for nausea, vomiting, diarrhea, constipation, blood in stool and abdominal distention.   Genitourinary: Negative for dysuria, hematuria, difficulty urinating and menstrual problem.   Musculoskeletal: Positive for arthralgias, back pain and myalgias. Negative for joint swelling.   Skin: Negative for color change, pallor and rash.   Neurological: Negative for dizziness, weakness, light-headedness, numbness and headaches.   Hematological: Negative for adenopathy. Does not bruise/bleed easily.  Psychiatric/Behavioral: Positive for depression and dysphoric mood. The patient is not nervous/anxious.    All other systems reviewed and are negative.      Vitals  Blood pressure 145/87, pulse 89, temperature 97.2 ??F (36.2 ??C), temperature source Oral, resp. rate 18, height 5' 3.75 (1.619 m), weight 200 lb (90.719 kg).    Physical Exam   Nursing note and vitals reviewed.  Constitutional: She is oriented to person, place, and time. She appears well-developed and well-nourished. No distress.     HENT:   Head: Normocephalic and atraumatic.   Mouth/Throat: No oropharyngeal exudate.   Eyes: Conjunctivae and EOM are normal.   Neck: Normal range of motion. Neck supple.   Cardiovascular: Normal rate, regular rhythm and normal heart sounds.    No murmur heard.  Pulmonary/Chest: Effort normal and breath sounds normal. No respiratory distress. She has no wheezes.   S/p bil skin sparing mastectomies with immediate reconstruction with tissue expanders, drains still in place, no bleeding.    Abdominal: Soft. Bowel sounds are normal. She exhibits no distension and no mass. There is no tenderness. There is no rebound and no guarding.   Musculoskeletal: Normal range of motion. She exhibits no edema and no tenderness.   Lymphadenopathy:        Head (right side): No submandibular adenopathy present.        Head (left side): No submandibular adenopathy present.     She has no cervical adenopathy.     She has no axillary adenopathy.   Neurological: She is alert and oriented to person, place, and time. Coordination normal.   Skin: Skin is warm and dry. No rash noted. She is not diaphoretic. No cyanosis or erythema. No pallor. Nails show no clubbing.   Psychiatric: She has a normal mood and affect. Her behavior is normal. Judgment and thought content normal.      Neurologic Exam     Mental Status   Oriented to person, place, and time.     Cranial Nerves      CN III, IV, VI   Extraocular motions are normal.       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 Role of ER/PR in cancer progression and need for adjuvant hormonal blockade- discussed arimidex for 5 yrs. Roughly chance of overall recurrence is around 15% and AI will reduce recurrence by 50%.  2. Role of chemotherapy with 30% risk reduction in relapse and role of oncotype dx testing in helping to decide when chemotherapy may benefit. If recurrence score > 25 will consider chemotherapy. If < 18, will probably decide against chemo.   3. We will wait for oncotype dx testing results to  decide re: chemo  4. Check dexa if going to start on AI, she however has bad osteoporosis, wonders if can be on tamoxifen instead. side effects of both discussed in detail.  5. No more  Mammograms   7. Return in 2-3 weeks for discussion of oncotype dx test results.   8. Will NOT  need adjuvant RT to complete local therapy.   9. Family history reviewed. Ref for genetic counselling.  10.  Labs:: Cbc, cmp,  at next visit.   11.Went over risk factors for breast cancer and risk reduction including not smoking, avoiding execcsive ETOH use, role of Aspirin, exercise, maintaining ideal BMI  and avoiding any hormonal supplements.     Also c/o increased terafullness recenlty.       PLAN:   Recommended she attempt to hold one medication at a time for 3 consecutive days to see if the tearful episodes resolve. Check  Oncotype. If score is >30, then chemotherapy will be the plan. An intermediate score will result in additional discussion with me for decision making. Also, discussed use of an A/I vs Tamoxifen. Patient has a known Dx of Osteoporosis. Plan is leaning toward use of Tamoxifen. Oncotype to be sent and patient will return in about 3 weeks to discuss results and definitive plan on 08/02/13. Patient verbalized understanding. Does not  need RT .  Referred to genetics already.                                Histories   She has a past medical history of Hyperlipidemia; Kidney stones; Depression; Dyslipidemia; Sleep apnea (06/17/2013); GERD (gastroesophageal reflux disease); and Constipation (06/17/2013).    She has past surgical history that includes Tonsillectomy; Salpingoophorectomy (2004); Sinus surgery; mid urethral sling  (2002); Esophagogastroduodenoscopy (N/A, 05/29/2012); Colonoscopy (N/A, 05/29/2012); Colonoscopy (N/A, 06/27/2012); Hysterectomy (0220); remove breast tissue expanders insertion implants (Bilateral, 06/19/2013); dissection axillary (Right, 06/19/2013); and Breast surgery.    Her family history includes Breast cancer in her other, other, and paternal aunt; COPD in her brother, father, mother, other, and sister; Coronary artery disease in her father and mother; Diabetes in her mother; Emphysema in her father; Heart attack in her brother; Heart disease in her brother and mother; Heart failure in her father; Hyperlipidemia in her father and mother; Hypertension in her mother; Other in her brother and sister.    She reports that she has quit smoking. She has never used smokeless tobacco. She reports that she does not drink alcohol or use illicit drugs.    Allergies  Crestor    Medications  Current Outpatient Prescriptions   Medication Sig   ??? acetaminophen Take 2 tablets (650 mg total) by mouth every 6 hours as needed for Pain.   ??? acyclovir Apply topically 5 times daily as needed.   ??? alendronate TAKE 1 TABLET BY MOUTH EVERY 7 DAYS   ??? atorvastatin Take 1 tablet (40 mg total) by mouth daily.   ??? cetirizine Take 10 mg by mouth daily.   ??? cyclobenzaprine Take 1 tablet (5 mg total) by mouth 3 times a day. Indications: Muscle Spasm   ??? ergocalciferol TAKE 1 CAPSULE BY MOUTH WEEKLY- VIT D #3   ??? glucosamine-chondroitin Take 1 tablet by mouth 3 times a day.   ??? magnesium Take by mouth.   ??? montelukast Take 1 tablet (10 mg total) by mouth at  bedtime.   ??? omega-3 fatty acids-fish oil Take  1 capsule (1,200 mg total) by mouth daily.   ??? omeprazole TAKE ONE CAPSULE BY MOUTH DAILY   ??? oxyCODONE Take 1-2 tablets (5-10 mg total) by mouth every 4 hours as needed for Pain.   ??? SUMAtriptan Take one tablet po daily as needed for migraine, may repeat in 2 hours if needed in 24 hours.     No current facility-administered medications for this visit.         The following portions of the patient's history were reviewed and updated as appropriate: allergies, current medications, past family history, past medical history, past social history, past surgical history and problem list.    Review of Lab Results  Lab Results   Component Value Date    WBC 9.9 06/12/2013    HGB 13.0 06/12/2013    HCT 38.2 06/12/2013    PLT 283 06/12/2013    CREATININE 0.60 06/11/2013    BUN 15 06/11/2013    PROT 7.4 04/02/2013    AST 18 04/13/2011    ALT 16 04/02/2013    BILITOT 0.4 04/02/2013    CALCIUM 10.1 06/11/2013    MG 2.0 04/02/2013     Lab Results   Component Value Date    LYMPHSABS 2301 03/27/2012    ALKPHOS 60 04/02/2013     No results found for this basename: PROTIME, INR, APTT     Lab Results   Component Value Date    TSH 1.36 04/02/2013     No results found for this basename: SPEP, UPEP, IGG, IGM, IGA, SERUM FREE LIGHT CHAINS, MSPIKEPCTUR        Imaging   Nm Injection Sentinel Node Wo Imaging    06/21/2013   The patient has right breast cancer and is referred for sentinel lymph node mapping prior to surgery. The patient received 0.55 millicuries of Tc-14m Lymphoseek intradermally. No imaging was performed at the request of Dr. Lucille Passy.     Report Verified by: Lovey Newcomer, M.D. at 06/21/2013 5:31 PM      Investigations Reviewed:   Mammogram:      Pathology:     CASE: [id number]  PATIENT: Sarah Huang    Clinical History: Bilateral total mastectomy, right axillary sentinel  lymph node biopsy, possible right axillary node dissection  Pre-Operative Diagnosis: breast  cancer  Post-Operative Diagnosis: same  Specimen(s) Submitted: A. left breast, short stitch superior, long stitch  lateral; B. right breast, short stitch superior, long stitch lateral; C.  Right axillary sentinellymph node #1  CPT Code(s): 88307 X 3; 88331 X 1; 88332 X 2; 88341 X 1; [address] X 1  Additional Information:    FINAL DIAGNOSIS:    A. Left breast, mastectomy:  - Fibrocystic change with focal atypical ductal hyperplasia (ADH).  - No evidence of in situ or invasive carcinoma.  - Unremarkable nipple.  - Resection margins are free of ADH.    B. Right breast, mastectomy:  - Well differentiated (grade 1) invasive ductal carcinoma.  - No ductal carcinoma in situ (DCIS) identified.  - Margins are free of DCIS and invasive carcinoma.  - Two biopsy sites identified.  - Background proliferative fibrocystic changes.  - Unremarkable nipple.  - See synoptic report.  - Pathologic staging: pT1a, pN0(sn), pMX.    C. Right axillary sentinel lymph node #1, excision:  - One lymph node is negative for metastatic carcinoma (0/1) by HE and  immunostain.    SYNOPTIC REPORT  Procedure:  Total mastectomy (including nipple and skin).  Lymph Node  Sampling:  Sentinel lymph node(s).  Specimen Laterality:  Right.  Histologic Type of Invasive Carcinoma:  Invasive ductal carcinoma (no special type or not otherwise specified).  Tumor Size: Size of Largest Invasive Carcinoma:  Greatest dimension of largest focus of invasion >1 mm: 3 mm.  Histologic Grade: Nottingham Histologic Score:  Glandular (Acinar)/Tubular Differentiation:  Score 1: >75% of tumor area forming glandular/tubular structures.  Nuclear Pleomorphism:  Score 1: Nuclei small with little increase in size in comparison with  normal breast epithelial cells, regular outlines, uniform nuclear  chromatin, little variation in size.  Mitotic Rate:  Score 1 (less than or equal to 3 mitoses per mm2).  Overall Grade:  Grade 1: scores of 3, 4, or 5.  Tumor Focality:  Single focus of  invasive carcinoma.  Ductal Carcinoma In Situ (DCIS):  No DCIS is present.  Margins (select all that apply):  Invasive Carcinoma:  Margins uninvolved by invasive carcinoma.  Distance from closest margin: 4 cm.  Specify margin: Deep and lateral.  Lymph Nodes:  Total number of lymph nodes examined (sentinel and nonsentinel): 1.  Number of sentinel lymph nodes examined: 1.  Lymph Node Involvement:  Number of lymph nodes with macrometastases (>2 mm): 0.  Number of lymph nodes with micrometastases (>0.2 mm to 2 mm and/or  >200cells): 0.  Number of lymph nodes with isolated tumor cells (less than or equal to 0.2  mm and less than or equal to 200 cells): 0.  Pathologic Staging (pTNM):  Primary Tumor (Invasive Carcinoma) (pT):  pT1a: Tumor >1 mm but less than or equal to 5 mm in greatest  dimension.  Regional Lymph Nodes (pN):  Modifier:  (sn): Only sentinel node(s) evaluated.  Category (pN):  pN0: No regional lymph node metastasis identified histologically.  Distant Metastasis (pM):  Not applicable.  Ancillary Studies:  Performed on biopsy specimen ([id number], 05/10/2013): ER positive, PR  positive, HER2 negative (1+), MIB1 proliferation score low (15%).    Alveria Apley, M.D. (Resident)/sar     Medical Decision Making:  The following items were considered in medical decision making:  Review / order clinical lab tests  Review / order radiology tests  Review / order other diagnostic tests/interventions  Reviewed outside records  Medication toxicity monitoring performed

## 2013-07-08 NOTE — Unmapped (Signed)
[facility]                      [facility]     PATIENT NAME:   Sarah Huang, Sarah Huang            MRN: [id number]  DATE OF BIRTH:  08-Feb-1955                     CSN: [id number]  SURGEON:        Livingston Diones, M.D.  ADMIT DATE: 06/19/2013  SERVICE:        Plastic Surgery  DICTATED BY:    Livingston Diones, M.D.  SURGERY DATE: 06/19/2013                                    OPERATIVE REPORT     PREOPERATIVE DIAGNOSIS(ES):  Bilateral total mastectomies.     POSTOPERATIVE DIAGNOSIS(ES):  Bilateral total mastectomies.     PROCEDURE(S) PERFORMED:  First stage reconstruction with placement of Mentor  Contour Profile 9200 series textured tissue expanders, 650 mL size with 100  mL added.     SURGEON(S):  1. Gaspar Cola, M.D.  2. Livingston Diones, M.D.  3. Angus Palms, M.D.     ANESTHESIA:  General.     ESTIMATED BLOOD LOSS:  Nil.     COMPLICATIONS:  None apparent.     DISPOSITION:  To recovery in stable condition.     CLINICAL HISTORY:  The patient presented for breast reconstruction.  We had  marked out a plan and confirmed with Dr. Fayette Pho.  After explanation of  risks, benefits and alternatives, the patient agreed to proceed.     DETAILS OF PROCEDURE(S):  When I scrubbed in, the skin flaps were viable.  The IMFs were resected.  The pec muscle and serratus fascia were released and  appropriate pockets developed.  A 3-0 Monocryl was used to secure the pec to  the inferior border of the skin flap.  After assuring hemostasis was complete  and placement of two 15 channel drains, the 650 mL expander was placed after  antibiotic irrigation with no saline.  The pocket then was closed with  running 3-0 PDS.  The expander was approximately 3/4 covered by muscle or  fascia.  The skin was closed with 3-0 and 4-0 Monocryl; drains connected to  suction.  She tolerated the procedure reasonably well.  The 100 mL was added  using a sterile closed technique  at the end of the procedure.                                            Livingston Diones, M.D.  WJK/wls  D:  07/08/2013 09:45  T:  07/08/2013 14:57  Job #:  [id number]           OPERATIVE REPORT                                             PAGE    1 of   1

## 2013-07-09 MED ORDER — montelukast (SINGULAIR) 10 mg tablet
10 | ORAL_TABLET | ORAL | Status: AC
Start: 2013-07-09 — End: 2014-01-20

## 2013-07-09 NOTE — Unmapped (Signed)
Last refill 11-20-12  Last OV 05-03-13, last phy 06-02-13  No appt sch

## 2013-07-19 ENCOUNTER — Ambulatory Visit: Admit: 2013-07-19 | Payer: PRIVATE HEALTH INSURANCE

## 2013-07-19 DIAGNOSIS — C50919 Malignant neoplasm of unspecified site of unspecified female breast: Secondary | ICD-10-CM

## 2013-07-19 NOTE — Unmapped (Signed)
Post op visit    Procedure: immediate bilateral TE    Date:  6.10.15    Complaints: some redness at insertion site, wound care recc      Findings:  Drains not removed today due to drainage amount.  Will follow up on Tuesday 07/23/13.    Appt Dates: OR Fill      Running Total   Right 100 ml         Left 100 ml           Assessment:wounds fine    Plan:check next week      Wounds fine n fill

## 2013-07-19 NOTE — Unmapped (Deleted)
Chief Complaint   Patient presents with   ??? Post-op Evaluation     immediate bilateral 650 TE-06/19/13       Patient ID: Sarah Huang is a 58 y.o. female.     History of Present Illness  {HPI Menu:22690}       Review of Systems    Allergies  Crestor    Medications  Outpatient Encounter Prescriptions as of 07/19/2013   Medication Sig Dispense Refill   ??? acetaminophen (TYLENOL) 325 MG tablet Take 2 tablets (650 mg total) by mouth every 6 hours as needed for Pain.  40 tablet  0   ??? acyclovir (ZOVIRAX) 5 % cream Apply topically 5 times daily as needed.  15 g  1   ??? alendronate (FOSAMAX) 70 MG tablet TAKE 1 TABLET BY MOUTH EVERY 7 DAYS  12 tablet  3   ??? atorvastatin (LIPITOR) 40 MG tablet Take 1 tablet (40 mg total) by mouth daily.  30 tablet  5   ??? cetirizine (ZYRTEC) 10 MG tablet Take 10 mg by mouth daily.       ??? cyclobenzaprine (FLEXERIL) 5 MG tablet Take 1 tablet (5 mg total) by mouth 3 times a day as needed for Muscle spasms. Indications: Muscle Spasm  30 tablet  0   ??? ergocalciferol (VITAMIN D2) 50,000 unit capsule TAKE 1 CAPSULE BY MOUTH WEEKLY- VIT D #3       ??? glucosamine-chondroitin 500-400 mg tablet Take 1 tablet by mouth 3 times a day.       ??? magnesium 250 mg Tab Take by mouth.       ??? montelukast (SINGULAIR) 10 mg tablet TAKE 1 TABLET BY MOUTH AT BEDTIME  30 tablet  5   ??? omega-3 fatty acids-fish oil (FISH OIL) 360-1,200 mg Cap Take 1 capsule (1,200 mg total) by mouth daily.  30 capsule  5   ??? omeprazole (PRILOSEC) 40 MG capsule TAKE ONE CAPSULE BY MOUTH DAILY  30 capsule  11   ??? oxyCODONE (ROXICODONE) 5 MG immediate release tablet Take 1-2 tablets (5-10 mg total) by mouth every 4 hours as needed for Pain.  60 tablet  0   ??? SUMAtriptan (IMITREX) 100 MG tablet Take one tablet po daily as needed for migraine, may repeat in 2 hours if needed in 24 hours.  12 tablet  2     No facility-administered encounter medications on file as of 07/19/2013.        Histories  She has a past medical history of  Hyperlipidemia; Kidney stones; Depression; Dyslipidemia; Sleep apnea (06/17/2013); GERD (gastroesophageal reflux disease); Constipation (06/17/2013); and Cancer.    She has past surgical history that includes Tonsillectomy; Salpingoophorectomy (2004); Sinus surgery; mid urethral sling  (2002); Esophagogastroduodenoscopy (N/A, 05/29/2012); Colonoscopy (N/A, 05/29/2012); Colonoscopy (N/A, 06/27/2012); Hysterectomy (0220); remove breast tissue expanders insertion implants (Bilateral, 06/19/2013); dissection axillary (Right, 06/19/2013); and Breast surgery (06/19/13).    Her family history includes Breast cancer in her other, other, and paternal aunt; COPD in her brother, father, mother, other, and sister; Coronary artery disease in her father and mother; Diabetes in her mother; Emphysema in her father; Heart attack in her brother; Heart disease in her brother and mother; Heart failure in her father; Hyperlipidemia in her father and mother; Hypertension in her mother; Other in her brother and sister.    She reports that she has quit smoking. She has never used smokeless tobacco. She reports that she does not drink alcohol or use illicit drugs.    {  Common ambulatory SmartLinks:19316}    Blood pressure 100/70, pulse 104, resp. rate 14, height 5' 3.75 (1.619 m), weight 200 lb (90.719 kg), SpO2 97.00%.  Physical Exam    Ortho Exam    Neurologic Exam    Prior Diagnostic Testing:   {NEUR PRIOR DIAGNOSTIC TESTING:[id number]}     Assessment  ***    Plan  ***       Cancer Staging:  Breast cancer    Primary site: Breast (Right)    Staging method: AJCC 7th Edition    Clinical: Stage IA (T1b, N0, cM0) signed by Cristal Generous, MD on 05/17/2013  9:23 AM    Pathologic: Stage IA (T1a, N0(i-), cM0) signed by Cristal Generous, MD on 07/02/2013 10:34 AM    Summary: Stage IA (T1a, N0(i-), cM0)    Medical Decision Making  The following items were considered in medical decision making:  {ZOX:[id number]}

## 2013-07-23 ENCOUNTER — Ambulatory Visit: Admit: 2013-07-23 | Discharge: 2013-07-23 | Payer: PRIVATE HEALTH INSURANCE

## 2013-07-23 DIAGNOSIS — Z9013 Acquired absence of bilateral breasts and nipples: Secondary | ICD-10-CM

## 2013-07-23 NOTE — Unmapped (Signed)
Post op visit    Procedure: Immediate placement of Bilateral Tissue Expanders ( )     Date: 06/19/2013    Complaints: None      Findings:Bilateral drains removed today without difficulty and gauze dressing was applied    Appt Dates: OR Fill 07/23/13     Running Total   Right         Left           Assessment:wounds fine    Plan:  Check three weeks

## 2013-07-30 ENCOUNTER — Ambulatory Visit: Admit: 2013-07-30 | Payer: PRIVATE HEALTH INSURANCE

## 2013-07-30 DIAGNOSIS — C50919 Malignant neoplasm of unspecified site of unspecified female breast: Secondary | ICD-10-CM

## 2013-07-30 NOTE — Unmapped (Signed)
Post op visit    Procedure: Immediate Bilateral Tissue Expanders ( )     Date: 06/19/2013    Complaints: Right breast swelling      Findings:    Appt Dates: OR Fill 07/23/13 07/30/13    Running Total   Right         Left           Assessment:possible small seroma left side    Plan:  Check next week

## 2013-08-02 ENCOUNTER — Ambulatory Visit: Payer: PRIVATE HEALTH INSURANCE | Attending: Gerontology

## 2013-08-06 ENCOUNTER — Ambulatory Visit: Admit: 2013-08-06 | Discharge: 2013-08-06 | Payer: PRIVATE HEALTH INSURANCE

## 2013-08-06 DIAGNOSIS — Z9013 Acquired absence of bilateral breasts and nipples: Secondary | ICD-10-CM

## 2013-08-06 NOTE — Unmapped (Signed)
Post op visit    Procedure: Immediate Bilateral Tissue Expanders ( )     Date: 06/19/2013    Complaints: None      Findings:    Appt Dates: OR Fill 07/23/13 07/30/13 08/06/13   Running Total   Right         Left           Assessment:wounds look fine    Plan:check and fill next week or so

## 2013-08-09 NOTE — Unmapped (Signed)
Aron Baba, RN w/ [facility] requesting a call back from the nurse, need to know if decision has been made for patient to start chemotheray.  Can contact Linda at [phone] ext [phone].

## 2013-08-09 NOTE — Unmapped (Signed)
Message left to [name]. We are going to see [name] next Friday 08/19/2013. We will discuss about the treatment on that day.

## 2013-08-13 ENCOUNTER — Ambulatory Visit: Admit: 2013-08-13 | Payer: PRIVATE HEALTH INSURANCE

## 2013-08-13 DIAGNOSIS — C50919 Malignant neoplasm of unspecified site of unspecified female breast: Secondary | ICD-10-CM

## 2013-08-13 NOTE — Unmapped (Signed)
Post op visit    Procedure: Immediate Bilateral Tissue Expander ( )     Date:06/19/2013    Complaints: None      Findings:    Appt Dates: OR Fill 07/23/13 07/30/13 08/06/13 08/13/13  Running Total   Right        Left          Assessment:approaching target volume    Plan:see back and fill in 3 weeks

## 2013-08-19 ENCOUNTER — Ambulatory Visit: Admit: 2013-08-19 | Discharge: 2013-08-19 | Payer: PRIVATE HEALTH INSURANCE

## 2013-08-19 DIAGNOSIS — C50919 Malignant neoplasm of unspecified site of unspecified female breast: Secondary | ICD-10-CM

## 2013-08-19 DIAGNOSIS — B351 Tinea unguium: Secondary | ICD-10-CM

## 2013-08-19 NOTE — Unmapped (Signed)
Discussed oncotype score of 12 with approximately 8% chance of recurrence in those who took tamoxifen in all tumor ( even > 2cm).   Given bilateral mastectomies, 3mm tumor, decided that you do not wish to go on any hormonal blockade, risk of recurrence is probably lower, ok with estimated benefit and risk ratio.  follow up annually.     Went over risk factors for breast cancer and risk reduction including not smoking, avoiding execcsive ETOH use, role of Aspirin, exercise, maintaining ideal BMI  and avoiding any hormonal supplements.      Genetic testing neg for brca .

## 2013-08-19 NOTE — Unmapped (Signed)
Refaxed FMLA paperwork originally faxed and scanned into patients chart on June 11 2013. [phone])  I also emailed a copy of the paperwork for patient records  [email or web address]

## 2013-08-19 NOTE — Unmapped (Signed)
Chief Complaint   Patient presents with   ??? Follow-up          History of Present Illness  Sarah Huang is a 58 y.o. female   Ref: Self Referral  No address on file    Dx:  Sarah Huang is a 58 y.o. female who presents due to newly diagnosed right breast cancer. She presented for screening mammography in early April where several changes were noted in both breasts. 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 The left breast lesion turned out to be a cyst which was aspirated and resolved.   She denies and breast changes, symptoms, or other concerns.   Patient is s/p Bilateral skin-sparing    total mastectomies (for R multicentric ductal carcinoma and L prophylactic),   with Right ax SN LN bx on 06/19/13.   and immediate reconstruction with bilateral tissue expanders by Dr. Jens Som       HPI Breast History:   Race: caucasian   Ethnicity: No Ashkenazi Jewish, [place], or Amish ancestry   Age of Menarche:15   LMP: December 2002   Gravida 3 Para 2 Misc 1   Age at first childbirth:21   History of breast feeding: no   History of hormonal contraceptives:yes about 4 years   Age of Menopause: Total hysterectomy at 46 with single oophorectomy. Had other ovary removed with an ectopic long ago. Essentially has no ovaries now.  History of HRT: yes, 3 months premarin   Assisted Reproduction Treatments: no   Hereditary risk factors (pre-breast biopsy): no   Prior history of breast biopsy: 05-10-13   Family history of breast or ovarian cancer: yes, paternal aunt diagnosed with breast cancer age of diagnosis unknown, paternal cousin diagnosed with breast cancer at age 6   Family history of other cancers: yes, maternal aunt diagnosed with colon cancer at age 40, maternal aunt diagnosed with pancreatic cancer at age 22   Patient interested in future childbearing:no   Patient interested in meeting with a fertility specialist: no    Comments:     has had many sinus surgeryies x3   Has microscopic hematuria chronic  No other bleeding issues.   Short period of HRT few months only.     Interval history:   follow up visit to discuss oncotype score.   Breast cancer stage: IA  Happy with score. Understands adjuvant hormonal blockade will decrease risk even further. Ok with < 5% risk recurrence . Understands side effects f tam.   Has shooting pain with TE. oerall mood good.          Review of Systems   Constitutional: Negative for fever, chills, diaphoresis, activity change, appetite change and fatigue.   HENT: Positive for congestion, postnasal drip and rhinorrhea.    Respiratory: Negative for cough, chest tightness and shortness of breath.    Cardiovascular: Negative for chest pain, palpitations and leg swelling.   Gastrointestinal: Negative for nausea, vomiting, diarrhea, constipation, blood in stool and abdominal distention.   Genitourinary: Negative for dysuria, hematuria, difficulty urinating and menstrual problem.   Musculoskeletal: Positive for arthralgias, back pain and myalgias. Negative for joint swelling.   Skin: Negative for color change, pallor and rash.   Neurological: Negative for dizziness, weakness, light-headedness, numbness and headaches.   Hematological: Negative for adenopathy. Does not bruise/bleed easily.   Psychiatric/Behavioral: Positive for depression and dysphoric mood. The patient is not nervous/anxious.  All other systems reviewed and are negative.      Vitals  Blood pressure 148/72, pulse 82, temperature 97.6 ??F (36.4 ??C), temperature source Oral, resp. rate 18, weight 204 lb (92.534 kg), SpO2 97.00%.    Physical Exam   Nursing note and vitals reviewed.  Constitutional: She is oriented to person, place, and time. She appears well-developed and well-nourished. No distress.   HENT:   Head: Normocephalic and atraumatic.   Eyes: Conjunctivae and EOM are normal.   Neck: Normal range of motion. Neck supple.    Cardiovascular: Normal rate and regular rhythm.    No murmur heard.  Pulmonary/Chest: Effort normal and breath sounds normal. No respiratory distress. She has no wheezes.   S/p bil skin sparing mastectomies with immediate reconstruction with tissue expanders,     Abdominal: She exhibits no distension and no mass. There is no tenderness. There is no rebound and no guarding.   Musculoskeletal: Normal range of motion. She exhibits no edema and no tenderness.   Lymphadenopathy:        Head (right side): No submandibular adenopathy present.        Head (left side): No submandibular adenopathy present.     She has no cervical adenopathy.     She has no axillary adenopathy.   Neurological: She is alert and oriented to person, place, and time. Coordination normal.   Skin: Skin is warm and dry. No rash noted. She is not diaphoretic. No cyanosis or erythema. No pallor. Nails show no clubbing.   Psychiatric: She has a normal mood and affect. Her behavior is normal. Judgment and thought content normal.      Neurologic Exam     Mental Status   Oriented to person, place, and time.     Cranial Nerves      CN III, IV, VI   Extraocular motions are normal.       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   1. 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 S/p bil mastectomies with tissue expanders.    We discussed the following:  1. 1. Role of ER/PR in cancer progression and need for adjuvant hormonal blockade- discussed arimidex for 5 yrs. Roughly chance of overall recurrence is around 15% and AI will reduce recurrence by 50%.  2. 2.  No more  Mammograms   3. Discussed oncotype score of 12 with approximately 8% chance of recurrence in those who took tamoxifen in all tumor ( even > 2cm).   4. Given bilateral mastectomies, 3mm tumor, decided that you do not wish to go on any hormonal blockade, risk of recurrence is probably lower, ok with estimated benefit and risk ratio.  5. follow up  annually.   6. Went over risk factors for breast cancer and risk reduction including not smoking, avoiding execcsive ETOH use, role of Aspirin, exercise, maintaining ideal BMI  and avoiding any hormonal supplements.    7. Genetic testing neg for brca .        PLAN:  As above. Chooses not to start on tamoxifen. r vs b discussed extensively. Small volume tumor probably ok to not go on tamoxien or AI.                                Histories   She has a past medical history of Hyperlipidemia; Kidney stones; Depression; Dyslipidemia; Sleep apnea (06/17/2013); GERD (gastroesophageal reflux disease); Constipation (06/17/2013); and Cancer.    She has past surgical history that includes Tonsillectomy; Salpingoophorectomy (2004); Sinus surgery; mid urethral sling  (2002); Esophagogastroduodenoscopy (N/A, 05/29/2012); Colonoscopy (N/A, 05/29/2012); Colonoscopy (N/A, 06/27/2012); Hysterectomy (0220); remove breast tissue expanders insertion implants (Bilateral, 06/19/2013); dissection axillary (Right, 06/19/2013); and Breast surgery (06/19/13).    Her family history includes Breast cancer in her other, other, and paternal aunt; COPD in her brother, father, mother, other, and sister; Coronary artery disease in her father and mother; Diabetes in her mother;  Emphysema in her father; Heart attack in her brother; Heart disease in her brother and mother; Heart failure in her father; Hyperlipidemia in her father and mother; Hypertension in her mother; Other in her brother and sister.    She reports that she has quit smoking. She has never used smokeless tobacco. She reports that she does not drink alcohol or use illicit drugs.    Allergies  Crestor    Medications  Current Outpatient Prescriptions   Medication Sig   ??? acetaminophen Take 2 tablets (650 mg total) by mouth every 6 hours as needed for Pain.   ??? acyclovir Apply topically 5 times daily as needed.   ??? alendronate TAKE 1 TABLET BY MOUTH EVERY 7 DAYS   ??? atorvastatin Take 1 tablet (40 mg total) by mouth daily.   ??? cetirizine Take 10 mg by mouth daily.   ??? cyclobenzaprine Take 1 tablet (5 mg total) by mouth 3 times a day as needed for Muscle spasms. Indications: Muscle Spasm   ??? glucosamine-chondroitin Take 1 tablet by mouth 3 times a day.   ??? magnesium Take by mouth.   ??? montelukast TAKE 1 TABLET BY MOUTH AT BEDTIME   ??? omeprazole TAKE ONE CAPSULE BY MOUTH DAILY   ??? SUMAtriptan Take one tablet po daily as needed for migraine, may repeat in 2 hours if needed in 24 hours.   ??? ergocalciferol TAKE 1 CAPSULE BY MOUTH WEEKLY- VIT D #3   ??? omega-3 fatty acids-fish oil Take 1 capsule (1,200 mg total) by mouth daily.     No current facility-administered medications for this visit.         The following portions of the patient's history were reviewed and updated as appropriate: allergies, current medications, past family history, past medical history, past social history, past surgical history and problem list.    Review of Lab Results  Lab Results   Component Value Date    WBC 9.9 06/12/2013    HGB 13.0 06/12/2013    HCT 38.2 06/12/2013    PLT 283 06/12/2013    CREATININE 0.60 06/11/2013    BUN 15 06/11/2013    PROT 7.4 04/02/2013    AST 18 04/13/2011  ALT 16 04/02/2013    BILITOT 0.4 04/02/2013    CALCIUM 10.1 06/11/2013    MG 2.0 04/02/2013      Lab Results   Component Value Date    LYMPHSABS 2301 03/27/2012    ALKPHOS 60 04/02/2013     No results found for this basename: PROTIME,  INR,  APTT     Lab Results   Component Value Date    TSH 1.36 04/02/2013     No results found for this basename: SPEP,  UPEP,  IGG,  IGM,  IGA,  SERUM FREE LIGHT CHAINS,  MSPIKEPCTUR        Imaging   Nm Injection Sentinel Node Wo Imaging    06/21/2013   The patient has right breast cancer and is referred for sentinel lymph node mapping prior to surgery. The patient received 0.55 millicuries of Tc-80m Lymphoseek intradermally. No imaging was performed at the request of Dr. Lucille Passy.     Report Verified by: Lovey Newcomer, M.D. at 06/21/2013 5:31 PM      Investigations Reviewed:   Mammogram:      Pathology:     CASE: [id number]  PATIENT: Rekita Ergle    Clinical History: Bilateral total mastectomy, right axillary sentinel  lymph node biopsy, possible right axillary node dissection  Pre-Operative Diagnosis: breast cancer  Post-Operative Diagnosis: same  Specimen(s) Submitted: A. left breast, short stitch superior, long stitch  lateral; B. right breast, short stitch superior, long stitch lateral; C.  Right axillary sentinellymph node #1  CPT Code(s): 88307 X 3; 88331 X 1; 88332 X 2; 88341 X 1; 78295 X 1  Additional Information:    FINAL DIAGNOSIS:    A. Left breast, mastectomy:  - Fibrocystic change with focal atypical ductal hyperplasia (ADH).  - No evidence of in situ or invasive carcinoma.  - Unremarkable nipple.  - Resection margins are free of ADH.    B. Right breast, mastectomy:  - Well differentiated (grade 1) invasive ductal carcinoma.  - No ductal carcinoma in situ (DCIS) identified.  - Margins are free of DCIS and invasive carcinoma.  - Two biopsy sites identified.  - Background proliferative fibrocystic changes.  - Unremarkable nipple.  - See synoptic report.  - Pathologic staging: pT1a, pN0(sn), pMX.    C. Right axillary sentinel lymph node #1,  excision:  - One lymph node is negative for metastatic carcinoma (0/1) by HE and  immunostain.    SYNOPTIC REPORT  Procedure:  Total mastectomy (including nipple and skin).  Lymph Node Sampling:  Sentinel lymph node(s).  Specimen Laterality:  Right.  Histologic Type of Invasive Carcinoma:  Invasive ductal carcinoma (no special type or not otherwise specified).  Tumor Size: Size of Largest Invasive Carcinoma:  Greatest dimension of largest focus of invasion >1 mm: 3 mm.  Histologic Grade: Nottingham Histologic Score:  Glandular (Acinar)/Tubular Differentiation:  Score 1: >75% of tumor area forming glandular/tubular structures.  Nuclear Pleomorphism:  Score 1: Nuclei small with little increase in size in comparison with  normal breast epithelial cells, regular outlines, uniform nuclear  chromatin, little variation in size.  Mitotic Rate:  Score 1 (less than or equal to 3 mitoses per mm2).  Overall Grade:  Grade 1: scores of 3, 4, or 5.  Tumor Focality:  Single focus of invasive carcinoma.  Ductal Carcinoma In Situ (DCIS):  No DCIS is present.  Margins (select all that apply):  Invasive Carcinoma:  Margins uninvolved by invasive carcinoma.  Distance from closest margin: 4 cm.  Specify margin: Deep and lateral.  Lymph Nodes:  Total number of lymph nodes examined (sentinel and nonsentinel): 1.  Number of sentinel lymph nodes examined: 1.  Lymph Node Involvement:  Number of lymph nodes with macrometastases (>2 mm): 0.  Number of lymph nodes with micrometastases (>0.2 mm to 2 mm and/or  >200cells): 0.  Number of lymph nodes with isolated tumor cells (less than or equal to 0.2  mm and less than or equal to 200 cells): 0.  Pathologic Staging (pTNM):  Primary Tumor (Invasive Carcinoma) (pT):  pT1a: Tumor >1 mm but less than or equal to 5 mm in greatest  dimension.  Regional Lymph Nodes (pN):  Modifier:  (sn): Only sentinel node(s) evaluated.  Category (pN):  pN0: No regional lymph node metastasis identified  histologically.  Distant Metastasis (pM):  Not applicable.  Ancillary Studies:  Performed on biopsy specimen ([id number], 05/10/2013): ER positive, PR  positive, HER2 negative (1+), MIB1 proliferation score low (15%).    Alveria Apley, M.D. (Resident)/sar     Medical Decision Making:  The following items were considered in medical decision making:  Review / order clinical lab tests  Review / order radiology tests  Review / order other diagnostic tests/interventions  Reviewed outside records  Medication toxicity monitoring performed

## 2013-08-19 NOTE — Unmapped (Signed)
Subjective  HPI:   Patient ID: Sarah Huang is a 58 y.o. female.    Chief Complaint:  Foot Injury          Here with c/o fungal infection great toe.  Has used topical creams, no help.  Also has red patch instep, not itchy.  Present for three months, not really changing.    Needs lipid panel, med change in [facility].      Medications:  Current Outpatient Prescriptions   Medication Sig Dispense Refill   ??? acetaminophen (TYLENOL) 325 MG tablet Take 2 tablets (650 mg total) by mouth every 6 hours as needed for Pain.  40 tablet  0   ??? acyclovir (ZOVIRAX) 5 % cream Apply topically 5 times daily as needed.  15 g  1   ??? alendronate (FOSAMAX) 70 MG tablet TAKE 1 TABLET BY MOUTH EVERY 7 DAYS  12 tablet  3   ??? atorvastatin (LIPITOR) 40 MG tablet Take 1 tablet (40 mg total) by mouth daily.  30 tablet  5   ??? cetirizine (ZYRTEC) 10 MG tablet Take 10 mg by mouth daily.       ??? cyclobenzaprine (FLEXERIL) 5 MG tablet Take 1 tablet (5 mg total) by mouth 3 times a day as needed for Muscle spasms. Indications: Muscle Spasm  30 tablet  0   ??? ergocalciferol (VITAMIN D2) 50,000 unit capsule TAKE 1 CAPSULE BY MOUTH WEEKLY- VIT D #3       ??? glucosamine-chondroitin 500-400 mg tablet Take 1 tablet by mouth 3 times a day.       ??? magnesium 250 mg Tab Take by mouth.       ??? montelukast (SINGULAIR) 10 mg tablet TAKE 1 TABLET BY MOUTH AT BEDTIME  30 tablet  5   ??? omeprazole (PRILOSEC) 40 MG capsule TAKE ONE CAPSULE BY MOUTH DAILY  30 capsule  11   ??? SUMAtriptan (IMITREX) 100 MG tablet Take one tablet po daily as needed for migraine, may repeat in 2 hours if needed in 24 hours.  12 tablet  2   ??? omega-3 fatty acids-fish oil (FISH OIL) 360-1,200 mg Cap Take 1 capsule (1,200 mg total) by mouth daily.  30 capsule  5     No current facility-administered medications for this visit.        ROS:   Review of Systems   Skin: Positive for rash.   All other systems reviewed and are negative.         Objective:   Physical Exam   Skin:   Left great toe with  thickened nail, white plaque under nail.  No erythema.  1 x 0.5 cm erythematous patch medial aspect left foot, smaller lesion poseriorly near heel.             Filed Vitals:    08/19/13 1154   BP: 128/80   Pulse: 76   Weight: 198 lb (89.812 kg)     Body mass index is 34.26 kg/(m^2).  There is no height on file to calculate BSA.                Assessment/Plan:     Patient Active Problem List   Diagnosis   ??? Esophageal reflux   ??? Elevated blood pressure reading without diagnosis of hypertension   ??? Calculus of kidney   ??? Plantar fascial fibromatosis   ??? Insomnia, unspecified   ??? Family history of diabetes mellitus   ??? Osteoporosis, unspecified   ???  Postablative ovarian failure   ??? Fatty liver   ??? Weight gain   ??? Hypercholesteremia- check fasting lipid profile on statin.   ??? Urinary retention with incomplete bladder emptying   ??? Hearing loss   ??? Trochanteric bursitis of left hip   ??? SUI (stress urinary incontinence, female)   ??? Post-void dribbling   ??? Microscopic hematuria   ??? Fibromyalgia   ??? Breast cancer   ??? Family history of cancer   ??? Family history of breast cancer   ??? S/P bilateral mastectomy   ??? Breast cancer, female     Onychomycosis, rash left foot- likely fungal as well.  Discussed treatment with oral agents, side effects and efficacy.  Will refer to derm for skin check. Pt will check with ins to see if will cover oral med for toenail fungus, adivsed pt unlikely to be covered. She will discuss further with derm at appt.

## 2013-08-19 NOTE — Unmapped (Signed)
Pt calling to speak to [place]. She reports that her employer never received that FMLA paperwork for her short term disability. She further reports that her employer used all of pt's paid time off due to this nonreceipt. Requesting a call back from [place].

## 2013-08-23 ENCOUNTER — Ambulatory Visit: Admit: 2013-08-23 | Payer: PRIVATE HEALTH INSURANCE

## 2013-08-23 DIAGNOSIS — E7800 Pure hypercholesterolemia, unspecified: Secondary | ICD-10-CM

## 2013-08-23 LAB — LIPID PANEL
Cholesterol, Total: 135 mg/dL (ref 0–200)
HDL: 42 mg/dL (ref 23–92)
LDL Cholesterol: 68 mg/dL (ref 0–100)
Triglycerides: 126 mg/dL (ref 48–352)

## 2013-08-23 LAB — HEPATIC FUNCTION PANEL, SERUM
ALT: 16 U/L (ref 7–52)
AST (SGOT): 14 U/L (ref 13–39)
Albumin: 4.1 g/dL (ref 3.5–5.7)
Alkaline Phosphatase: 59 U/L (ref 36–125)
Bilirubin, Direct: 0.07 mg/dL (ref 0.00–0.40)
Bilirubin, Indirect: 0.33 mg/dL (ref 0.00–1.10)
Total Bilirubin: 0.4 mg/dL (ref 0.0–1.5)
Total Protein: 6.7 g/dL (ref 6.4–8.9)

## 2013-08-27 ENCOUNTER — Ambulatory Visit: Admit: 2013-08-27 | Discharge: 2013-08-27 | Payer: PRIVATE HEALTH INSURANCE

## 2013-08-27 DIAGNOSIS — C50919 Malignant neoplasm of unspecified site of unspecified female breast: Secondary | ICD-10-CM

## 2013-08-27 NOTE — Unmapped (Signed)
Post op visit    Procedure:Immediate Bilateral Tissue Expanders ( )     Date: 06/19/2013    Complaints: None      Findings:    Appt Dates: OR Fill 07/23/13 07/30/13 08/06/13 08/13/13 08/27/13 Running Total   Right         Left           Assessment:near target volume    Plan:plan expander implant exchange

## 2013-09-04 ENCOUNTER — Ambulatory Visit: Admit: 2013-09-04 | Discharge: 2013-09-04 | Payer: PRIVATE HEALTH INSURANCE

## 2013-09-04 DIAGNOSIS — L821 Other seborrheic keratosis: Secondary | ICD-10-CM

## 2013-09-04 MED ORDER — efinaconazole (JUBLIA) 10 % SolA
10 | Freq: Every day | TOPICAL | 1.00 refills | 30.00000 days | Status: AC
Start: 2013-09-04 — End: 2014-07-25

## 2013-09-04 NOTE — Unmapped (Signed)
MULTIDISCIPLINARY CONSULTATION NOTE presented  09-03-2013  By Dr. Tresa Endo  (HPI Note of 08-19-2013)  Dx:   Sarah Huang is a 58 y.o. female who presents due to newly diagnosed right breast cancer. She presented for screening mammography in early April where several changes were noted in both breasts. 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 The left breast lesion turned out to be a cyst which was aspirated and resolved.   She denies and breast changes, symptoms, or other concerns.   Patient is s/p Bilateral skin-sparing   total mastectomies (for R multicentric ductal carcinoma and L prophylactic),   with Right ax SN LN bx on 06/19/13.   and immediate reconstruction with bilateral tissue expanders by Dr. Jens Som   HPI Breast History:   Race: caucasian   Ethnicity: No Ashkenazi Jewish, [place], or Amish ancestry   Age of Menarche:15   LMP: December 2002   Gravida 3 Para 2 Misc 1   Age at first childbirth:21   History of breast feeding: no   History of hormonal contraceptives:yes about 4 years   Age of Menopause: Total hysterectomy at 46 with single oophorectomy. Had other ovary removed with an ectopic long ago. Essentially has no ovaries now.   History of HRT: yes, 3 months premarin   Assisted Reproduction Treatments: no   Hereditary risk factors (pre-breast biopsy): no   Prior history of breast biopsy: 05-10-13   Family history of breast or ovarian cancer: yes, paternal aunt diagnosed with breast cancer age of diagnosis unknown, paternal cousin diagnosed with breast cancer at age 86   Family history of other cancers: yes, maternal aunt diagnosed with colon cancer at age 48, maternal aunt diagnosed with pancreatic cancer at age 11   Patient interested in future childbearing:no   Patient interested in meeting with a fertility specialist: no   Comments:   has had many sinus surgeryies x3   Has microscopic hematuria  chronic   No other bleeding issues.   Short period of HRT few months only.   Interval history:   follow up visit to discuss oncotype score.   Breast cancer stage: IA   Happy with score. Understands adjuvant hormonal blockade will decrease risk even further. Ok with < 5% risk recurrence . Understands side effects f tam.   Has shooting pain with TE. oerall mood good.   MDP RECOMMENDATIONS:  Staging/Pathology Information - Right multicentric disease with Invasive Ductal Carcinoma of the Right Breast,  ER+/PR+ / HER2-Neg/  pT1bN0 ( 3mm focus)  And DCIS -  Surgical Oncology --  Underwent  Bilateral skin sparring total mastectomies.  Right sentinel lymph node biopsy  Reconstruction_  Implant based reconstruction  Medical Oncology -- Oncotype  -  With results indicating less than 5% recurrence risk with adjuvant hormonal blockade alone  Radiation Oncology --  No indication  Recommendations for treatment are made with consideration of the national cancer network guidelines for breast cancer treatment, v1.2015.  Recommendations are based on the most recent patient information available at the time of this discussion.Marland Kitchen

## 2013-09-04 NOTE — Unmapped (Signed)
Subjective  HPI:   Patient ID: Sarah Huang is a 58 y.o. female.    Chief Complaint:  Chief Complaint   Patient presents with   ??? Skin Lesion     pt is here to have all over lesions checked    ??? Nail Problem     toenail issues on the left foot    ??? Skin Lesion     dry, red lesions left foot        Numerous asymptomatic pigmented lesions to trunk, arms, and legs x years without change or treatments. No personal or family hx of skin cancer.      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 Allergies reviewed    Objective:   Physical Exam  Derm Physical Exam:  Examination was performed of the following were unremarkable: psych/neuro, head/face, conjunctivae/eyelids, gums/teeth/lips, neck, breast/axilla/chest, abdomen, back, RUE and LUE    Abnormalities noted include: RLE, LLE and nails/digits    1. Multiple 1cm or less verrucous brown non-indurated stuck on papules scattered over    2. All toenails with yellowing, thickening, subungal debris. Left great nail advanced. bilat plantar with wavy keratotic patches    A Complete Skin Exam was Completed on: September 04, 2013  Assessment/Plan:   1. SK, benign appearing  -educated on self surveillance of skin lesions with ABCDE criteria (explained)  -educated on sun avoidance and protection; new sunscreen guidelines ( broad spectrum sun screen with SPF 30 or greater q2 hours while exposed, q 1hours if getting wet or sweating a lot).  -Annual FSE  -Monthly self FSE     2. Onychomycosis (clinical dx), and tinea pedis  -white vinegar soaks 1/3 parts water tiw x 10 min to feet  -discussed options. Opts for topical tx: jublia daily x 52 weeks. Call philidor rx today    -rtc 4-6 mo's

## 2013-09-11 NOTE — Unmapped (Signed)
Will present the patient at the breast cancer tumor board .  When discussed, pathology notes that size on core is larger than on final resection, final tumor volume and size is pT1b , 6mm.   Board felt that it is reasonable to rediscuss this with her , given that oncotype of 12 with reucrence risk of 8 is with tamoxifen, and will increase if not taking tamoxifen, probably to 16.  Will bring pt back for discussion.

## 2013-09-20 MED ORDER — atorvastatin (LIPITOR) 40 MG tablet
40 | ORAL_TABLET | ORAL | Status: AC
Start: 2013-09-20 — End: 2014-01-20

## 2013-09-27 ENCOUNTER — Ambulatory Visit: Admit: 2013-09-27 | Payer: PRIVATE HEALTH INSURANCE

## 2013-09-27 DIAGNOSIS — C50919 Malignant neoplasm of unspecified site of unspecified female breast: Secondary | ICD-10-CM

## 2013-09-27 NOTE — Unmapped (Signed)
Post op visit    Procedure:  First stage reconstruction with placement of Mentor   Contour Profile 9200 series textured tissue expanders, 650 mL size with 100   mL added.      Date: 06/19/13    Complaints: slight pain in breast      Findings: at target volume      Assessment:oon course      Plan: arrange for expander implant exchnages  Risks, potential benefits, and alternatives reviewed in detail. All questions answered and she agrees to proceed.

## 2013-10-04 ENCOUNTER — Ambulatory Visit: Admit: 2013-10-04 | Discharge: 2013-10-04 | Payer: PRIVATE HEALTH INSURANCE

## 2013-10-04 DIAGNOSIS — C50919 Malignant neoplasm of unspecified site of unspecified female breast: Secondary | ICD-10-CM

## 2013-10-04 MED ORDER — tamoxifen (NOLVADEX) 20 MG tablet
20 | ORAL_TABLET | Freq: Every day | ORAL | 1.00 refills | 30.00000 days | Status: AC
Start: 2013-10-04 — End: 2014-02-04

## 2013-10-04 NOTE — Unmapped (Signed)
Navigation  LDex-08/29/13  Given bilateral mastectomies, 3mm tumor, decided that you do not wish to go on any hormonal blockade, risk of recurrence is probably lower, ok with estimated benefit and risk ratio.  follow up annually-due 08/2014  Sarah Huang 09/27/13  Sarah Huang 10/04/13-PLAN:     Discussed risks and benefits of starting on tamoxifen, inlight of upstaging of tumor to pT1bN0M0 . S/p TAH/BSO, therefore no risk of endometrial cancer with tamoxifen. H/o osteoporosis, would avoid anastrazole . Will start on tamoxifen 20mg  once daily, follow up in 1 month with by Katina Dung CNP. For survivorship visit.   Sarah Huang Post op 11/01/13  Sarah Huang 11/08/13-Survivorship  Tissue Exchange 11/26/13  Sarah Huang F/U-01/14/14

## 2013-10-04 NOTE — Unmapped (Signed)
Chief Complaint   Patient presents with   ??? Follow-up          History of Present Illness  Sarah Huang is a 58 y.o. female   Ref: Attending Provider Unknown  [address]  [place], [place] [address]    Dx:  Sarah Huang is a 58 y.o. female who presents due to newly diagnosed right breast cancer. She presented for screening mammography in early April where several changes were noted in both breasts. 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 The left breast lesion turned out to be a cyst which was aspirated and resolved.   She denies and breast changes, symptoms, or other concerns.   Patient is s/p Bilateral skin-sparing    total mastectomies (for R multicentric ductal carcinoma and L prophylactic),   with Right ax SN LN bx on 06/19/13.   and immediate reconstruction with bilateral tissue expanders by Dr. Jens Som       HPI Breast History:   Race: caucasian   Ethnicity: No Ashkenazi Jewish, [place], or Amish ancestry   Age of Menarche:15   LMP: December 2002   Gravida 3 Para 2 Misc 1   Age at first childbirth:21   History of breast feeding: no   History of hormonal contraceptives:yes about 4 years   Age of Menopause: Total hysterectomy at 46 with single oophorectomy. Had other ovary removed with an ectopic long ago. Essentially has no ovaries now.  History of HRT: yes, 3 months premarin   Assisted Reproduction Treatments: no   Hereditary risk factors (pre-breast biopsy): no   Prior history of breast biopsy: 05-10-13   Family history of breast or ovarian cancer: yes, paternal aunt diagnosed with breast cancer age of diagnosis unknown, paternal cousin diagnosed with breast cancer at age 59   Family history of other cancers: yes, maternal aunt diagnosed with colon cancer at age 84, maternal aunt diagnosed with pancreatic cancer at age 54   Patient interested in future childbearing:no   Patient interested in meeting with a  fertility specialist: no   Comments:     has had many sinus surgeryies x3   Has microscopic hematuria chronic  No other bleeding issues.   Short period of HRT few months only.     Interval history:   1. follow up visit to discuss finding that tumor size highrer on biopsy - 6mm. Worried about side effects of tamoxifen     Breast cancer stage: IA   Understands adjuvant hormonal blockade will decrease risk even further.  . Understands side effects f tam.   Has shooting pain with TE. overall mood depressed, gained weight.           Review of Systems   Constitutional: Positive for weight gain, activity change and appetite change. Negative for fever, chills, diaphoresis and fatigue.   HENT: Positive for congestion, postnasal drip and rhinorrhea.    Respiratory: Negative for cough, chest tightness and shortness of breath.    Cardiovascular: Negative for chest pain, palpitations and leg swelling.   Gastrointestinal: Positive for diarrhea. Negative for nausea, vomiting, constipation, blood in stool and abdominal distention.   Genitourinary: Negative for dysuria, hematuria, difficulty urinating and menstrual problem.   Musculoskeletal: Positive for arthralgias, back pain, gait problem and myalgias. Negative for joint swelling.   Skin: Negative for color change, pallor and rash.   Neurological: Positive for headaches (sinus). Negative for dizziness, weakness, light-headedness and  numbness.   Hematological: Negative for adenopathy. Does not bruise/bleed easily.   Psychiatric/Behavioral: Positive for depression and dysphoric mood (teraful. h/o depression in past, rx with psychotherapy). The patient is not nervous/anxious.    All other systems reviewed and are negative.      Vitals  Blood pressure 145/54, pulse 76, temperature 97.6 ??F (36.4 ??C), resp. rate 17, height 5' 3.75 (1.619 m), weight 206 lb (93.441 kg).    Physical Exam   Nursing note and vitals reviewed.  Constitutional: She is oriented to person, place, and time. She  appears well-developed and well-nourished. No distress.   obese   HENT:   Head: Normocephalic and atraumatic.   Eyes: Conjunctivae and EOM are normal.   Neck: Normal range of motion. Neck supple.   Cardiovascular: Normal rate and regular rhythm.    No murmur heard.  Pulmonary/Chest: Effort normal and breath sounds normal. No respiratory distress. She has no wheezes.   S/p bil skin sparing mastectomies with immediate reconstruction with tissue expanders,     Abdominal: She exhibits no distension and no mass. There is no tenderness. There is no rebound and no guarding.   Musculoskeletal: Normal range of motion. She exhibits no edema and no tenderness.   Lymphadenopathy:        Head (right side): No submandibular adenopathy present.        Head (left side): No submandibular adenopathy present.     She has no cervical adenopathy.     She has no axillary adenopathy.   Neurological: She is alert and oriented to person, place, and time. Coordination normal.   Skin: Skin is warm and dry. No rash noted. She is not diaphoretic. No cyanosis or erythema. No pallor. Nails show no clubbing.   Psychiatric: She has a normal mood and affect. Her behavior is normal. Judgment and thought content normal.   Tearful, worried about side effects of tamoxifen         Neurologic Exam     Mental Status   Oriented to person, place, and time.     Cranial Nerves      CN III, IV, VI   Extraocular motions are normal.       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   1. 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 S/p bil mastectomies with tissue expanders.  2. Dysphoric mood, ? Depression, frequent headaches    We discussed the following:  1. Role of ER/PR in cancer progression and need for adjuvant hormonal blockade- discussed arimidex for 5 yrs. Roughly chance of overall recurrence is around 15% and tamoxifen   will reduce recurrence by 50%.  2.  No more  Mammograms  1. Discussed oncotype score of 12 with approximately 8% chance of recurrence in those who took tamoxifen  .   Given bilateral mastectomies, 6mm tumor, would now recommend tamoxifen  for 5 yrs  Went over risk factors for breast cancer and risk reduction including not smoking, avoiding execcsive ETOH use, role of Aspirin, exercise, maintaining ideal BMI  and avoiding any hormonal supplements.    Genetic testing neg for brca .        PLAN:     Discussed risks and benefits of starting on tamoxifen, inlight of upstaging of tumor to pT1bN0M0 . S/p TAH/BSO, therefore no risk of endometrial cancer with tamoxifen. H/o osteoporosis, would avoid anastrazole . Will start on tamoxifen 20mg  once daily, follow up in 1 month with by Katina Dung CNP. For survivorship visit.                                  Histories   She has a past medical history of Hyperlipidemia; Kidney stones; Depression; Dyslipidemia; Sleep apnea (06/17/2013); GERD (gastroesophageal reflux disease); Constipation (06/17/2013); Cancer; Dermatitis; and Psoriasis.    She has past surgical history that includes Tonsillectomy; Salpingoophorectomy (2004); Sinus surgery; mid urethral sling  (2002); Esophagogastroduodenoscopy (N/A, 05/29/2012); Colonoscopy (N/A, 05/29/2012); Colonoscopy  (N/A, 06/27/2012); Hysterectomy (0220); remove breast tissue expanders insertion implants (Bilateral, 06/19/2013); dissection axillary (Right, 06/19/2013); and Breast surgery (06/19/13).    Her family history includes Breast cancer in her other, other, and paternal aunt; COPD in her brother, father, mother, other, and sister; Coronary artery disease in her father and mother; Diabetes in her mother; Emphysema in her father; Heart attack in her brother; Heart disease in her brother and mother; Heart failure in her father; Hyperlipidemia in her father and mother; Hypertension in her mother; Other in her brother and sister; Psoriasis in her brother and sister. There is no history of Eczema or Melanoma.    She reports that she has quit smoking. She has never used smokeless tobacco. She reports that she does not drink alcohol or use illicit drugs.    Allergies  Crestor    Medications  Current Outpatient Prescriptions   Medication Sig   ??? acetaminophen Take 2 tablets (650 mg total) by mouth every 6 hours as needed for Pain.   ??? acyclovir Apply topically 5 times daily as needed.   ??? alendronate TAKE 1 TABLET BY MOUTH EVERY 7 DAYS   ??? atorvastatin TAKE 1 TABLET BY MOUTH DAILY   ??? cetirizine Take 10 mg by mouth daily.   ??? efinaconazole Apply 1 drop topically daily.   ??? ergocalciferol TAKE 1 CAPSULE BY MOUTH WEEKLY- VIT D #3   ??? glucosamine-chondroitin Take 1 tablet by mouth 3 times a day.   ??? magnesium Take by mouth.   ??? montelukast TAKE 1 TABLET BY MOUTH AT BEDTIME   ??? omeprazole TAKE ONE CAPSULE BY MOUTH DAILY   ??? SUMAtriptan Take one tablet po daily as needed for migraine, may repeat in 2 hours if needed in 24 hours.   ??? omega-3 fatty acids-fish oil Take 1 capsule (1,200 mg total) by mouth daily.   ??? tamoxifen Take 1 tablet (20 mg total) by mouth daily. Indications: HORMONE RECEPTOR POSITIVE BREAST CANCER     No current facility-administered medications for this visit.         The following portions of the patient's history  were reviewed and updated as appropriate: allergies, current medications,  past family history, past medical history, past social history, past surgical history and problem list.    Review of Lab Results  Lab Results   Component Value Date    WBC 9.9 06/12/2013    HGB 13.0 06/12/2013    HCT 38.2 06/12/2013    PLT 283 06/12/2013    CREATININE 0.60 06/11/2013    BUN 15 06/11/2013    PROT 6.7 08/23/2013    AST 18 04/13/2011    ALT 16 08/23/2013    BILITOT 0.4 08/23/2013    CALCIUM 10.1 06/11/2013    MG 2.0 04/02/2013     Lab Results   Component Value Date    LYMPHSABS 2301 03/27/2012    ALKPHOS 59 08/23/2013     No results found for this basename: PROTIME,  INR,  APTT     Lab Results   Component Value Date    TSH 1.36 04/02/2013     No results found for this basename: SPEP,  UPEP,  IGG,  IGM,  IGA,  SERUM FREE LIGHT CHAINS,  MSPIKEPCTUR        Imaging   Nm Injection Sentinel Node Wo Imaging    06/21/2013   The patient has right breast cancer and is referred for sentinel lymph node mapping prior to surgery. The patient received 0.55 millicuries of Tc-77m Lymphoseek intradermally. No imaging was performed at the request of Dr. Lucille Passy.     Report Verified by: Lovey Newcomer, M.D. at 06/21/2013 5:31 PM      Investigations Reviewed:   Mammogram:      Pathology:     CASE: [id number]  PATIENT: Sarah Huang    Clinical History: Bilateral total mastectomy, right axillary sentinel  lymph node biopsy, possible right axillary node dissection  Pre-Operative Diagnosis: breast cancer  Post-Operative Diagnosis: same  Specimen(s) Submitted: A. left breast, short stitch superior, long stitch  lateral; B. right breast, short stitch superior, long stitch lateral; C.  Right axillary sentinellymph node #1  CPT Code(s): 88307 X 3; 88331 X 1; 88332 X 2; 88341 X 1; 16109 X 1  Additional Information:    FINAL DIAGNOSIS:    A. Left breast, mastectomy:  - Fibrocystic change with focal atypical ductal hyperplasia (ADH).  - No evidence of in situ or  invasive carcinoma.  - Unremarkable nipple.  - Resection margins are free of ADH.    B. Right breast, mastectomy:  - Well differentiated (grade 1) invasive ductal carcinoma.  - No ductal carcinoma in situ (DCIS) identified.  - Margins are free of DCIS and invasive carcinoma.  - Two biopsy sites identified.  - Background proliferative fibrocystic changes.  - Unremarkable nipple.  - See synoptic report.  - Pathologic staging: pT1a, pN0(sn), pMX.    C. Right axillary sentinel lymph node #1, excision:  - One lymph node is negative for metastatic carcinoma (0/1) by HE and  immunostain.    SYNOPTIC REPORT  Procedure:  Total mastectomy (including nipple and skin).  Lymph Node Sampling:  Sentinel lymph node(s).  Specimen Laterality:  Right.  Histologic Type of Invasive Carcinoma:  Invasive ductal carcinoma (no special type or not otherwise specified).  Tumor Size: Size of Largest Invasive Carcinoma:  Greatest dimension of largest focus of invasion >1 mm: 3 mm.  Histologic Grade: Nottingham Histologic Score:  Glandular (Acinar)/Tubular Differentiation:  Score 1: >75% of tumor area forming glandular/tubular structures.  Nuclear Pleomorphism:  Score 1: Nuclei small with little increase in size in comparison with  normal breast epithelial  cells, regular outlines, uniform nuclear  chromatin, little variation in size.  Mitotic Rate:  Score 1 (less than or equal to 3 mitoses per mm2).  Overall Grade:  Grade 1: scores of 3, 4, or 5.  Tumor Focality:  Single focus of invasive carcinoma.  Ductal Carcinoma In Situ (DCIS):  No DCIS is present.  Margins (select all that apply):  Invasive Carcinoma:  Margins uninvolved by invasive carcinoma.  Distance from closest margin: 4 cm.  Specify margin: Deep and lateral.  Lymph Nodes:  Total number of lymph nodes examined (sentinel and nonsentinel): 1.  Number of sentinel lymph nodes examined: 1.  Lymph Node Involvement:  Number of lymph nodes with macrometastases (>2 mm): 0.  Number of lymph  nodes with micrometastases (>0.2 mm to 2 mm and/or  >200cells): 0.  Number of lymph nodes with isolated tumor cells (less than or equal to 0.2  mm and less than or equal to 200 cells): 0.  Pathologic Staging (pTNM):  Primary Tumor (Invasive Carcinoma) (pT):  pT1a: Tumor >1 mm but less than or equal to 5 mm in greatest  dimension.  Regional Lymph Nodes (pN):  Modifier:  (sn): Only sentinel node(s) evaluated.  Category (pN):  pN0: No regional lymph node metastasis identified histologically.  Distant Metastasis (pM):  Not applicable.  Ancillary Studies:  Performed on biopsy specimen ([id number], 05/10/2013): ER positive, PR  positive, HER2 negative (1+), MIB1 proliferation score low (15%).    Alveria Apley, M.D. (Resident)/sar     Medical Decision Making:  The following items were considered in medical decision making:  Review / order clinical lab tests  Review / order radiology tests  Review / order other diagnostic tests/interventions  Reviewed outside records  Medication toxicity monitoring performed

## 2013-10-04 NOTE — Unmapped (Signed)
Discussed risks and benefits of starting on tamoxifen, inlight of upstaging of tumor to pT1bN0M0 . S/p TAH/BSO, therefore no risk of endometrial cancer with tamoxifen. H/o osteoporosis, would avoid anastrazole . Will start on tamoxifen 20mg  once daily, follow up in 1 month with by Katina Dung CNP. For survivorship visit.

## 2013-11-01 ENCOUNTER — Ambulatory Visit: Admit: 2013-11-01 | Payer: PRIVATE HEALTH INSURANCE

## 2013-11-01 DIAGNOSIS — C50919 Malignant neoplasm of unspecified site of unspecified female breast: Secondary | ICD-10-CM

## 2013-11-01 NOTE — Unmapped (Signed)
Pre-op exam, scheduled for bilateral TE exchange for permanent implants on 11-26-13  No new problems  On exam alert and pleasant  Expanders at target volume w good coverage  Abdomen benign  No axillary lesuions, good shoulder rom    Imp ready for expander implant exchange  Risks, potential benefits, and alternatives reviewed in detail. All questions answered and she agrees to proceed.

## 2013-11-06 NOTE — Unmapped (Signed)
Spoke to Sarah Huang regarding her upcoming surgery on 11-26-13 for exchange of bilateral TE for permanent implants.  She would prefer to keep this surgery date and see Dr. Doristine Counter for pre-op & evaluation.   An appointment was made for 11-11-13 at 4:00pm [facility] to see Dr. Doristine Counter.      Will update her FMLA papers.  Notified OR and precert of change in provider for surgery.  Advised patient to call with any questions.  She understands.

## 2013-11-08 ENCOUNTER — Ambulatory Visit: Payer: PRIVATE HEALTH INSURANCE | Attending: Gerontology

## 2013-11-11 ENCOUNTER — Ambulatory Visit: Admit: 2013-11-11 | Payer: PRIVATE HEALTH INSURANCE

## 2013-11-11 ENCOUNTER — Encounter

## 2013-11-11 DIAGNOSIS — Z9013 Acquired absence of bilateral breasts and nipples: Secondary | ICD-10-CM

## 2013-11-11 DIAGNOSIS — E785 Hyperlipidemia, unspecified: Secondary | ICD-10-CM

## 2013-11-11 NOTE — Unmapped (Signed)
Routed to K. Nolly for medical records.

## 2013-11-11 NOTE — Unmapped (Signed)
PRE-OPERATIVE HISTORY AND PHYSICAL       [facility] NP / PA:   Trang Bouse Bierschbach, NP    Date of Surgery:  November 26, 2013  Surgeon:  Dr. Burnett  Diagnosis:  Acquired absence of breast and nipple  Procedure:  Exchange bilateral tissue expander for implant    Patient ID: Sarah Huang is a 58 y.o. female.    Patient is being seen today at the request of Dr. Burnett to render an opinion on perioperative risk optimization and to coordinate medical care as necessary prior to the following procedure:  Exchange bilateral tissue expander for implant.    Chief Complaint   Patient presents with   ??? Pre-op Exam     acquired absence of breast and nipple       History of Present Illness:  The patient is a 58 yo woman with a PMHx of HLD, depression, sleep apnea, GERD and breast cancer.  She had a screening mammogram in 04/2013 and was found to have several changes in both breasts.  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  The (L) breast lesion was a cyst which was aspirated and resolved.  She underwent bilateral skin-sparing total mastectomies ( (L) was prophylactic) on 06/19/13 at which time she had bilateral tissue expanders placed.  Since then she c/o a great deal of discomfort from the tissue expanders which she treats with Ibuprofen.  Currently she reports pain 5/10 when she is working and the expanders shift around, improves with lying still/keeping still, worse with any movement causing a shift in the expanders.     Chronic Medical Conditions, Severity, Optimization:    1. Cardiac / Functional status:  Denies h/o CAD/CHF.  She had a stress ECHO 08/12/2009 for CP that is mentioned in Dr. Leesar's note:  Stress echo demonstrated normal LV function and there was no evidence for ischemia,. However we are unable to find the report.   2.  HLD:  Taking Lipitor.  3. Obesity / OSA: Body mass index is 36.68 kg/(m^2).  No CPAP  4. 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   5.  GERD: symptoms managed with Prilosec.    D[facility] Activity Scale:  4 - Raking leaves; weeding or pushing a power mower.      Medical History:     Past Medical History   Diagnosis Date   ??? Hyperlipidemia    ??? Kidney stones    ??? Depression    ??? Dyslipidemia    ??? Sleep apnea 06/17/2013     not using C-PAP   ??? GERD (gastroesophageal reflux disease)    ??? Constipation 06/17/2013     IIBS   ??? Dermatitis    ??? Psoriasis    ??? Cancer 06/2013     (R) breast       Surgical History:     Past Surgical History   Procedure Laterality Date   ??? Tonsillectomy       in the past   ??? Salpingoophorectomy  2004     Hysty/BSO-etopic pregnancy/prolapse   ??? Sinus surgery       hx sinus surg x4   ??? Mid urethral sling   2002     at time of [facility]   ??? Esophagogastroduodenoscopy N/A 05/29/2012     Procedure: EGD;  Surgeon: Richard Rood, MD;  Location: [facility];    Service: Gastroenterology;  Laterality: N/A;   ??? Colonoscopy N/A 05/29/2012     Procedure: COLONOSCOPY W/ OR W/O BIOPSY;  Surgeon: Richard Rood, MD;  Location: [facility];  Service: Gastroenterology;  Laterality: N/A;  limited colonoscopy.   ??? Colonoscopy N/A 06/27/2012     Procedure: COLONOSCOPY W/ OR W/O BIOPSY;  Surgeon: Richard Rood, MD;  Location: [facility];  Service: Gastroenterology;  Laterality: N/A;  colonoscopy   ??? Hysterectomy  0220     Hysty/BSO 2004-etopic pregnancy/prolapse   ??? Remove breast tissue expanders insertion implants Bilateral 06/19/2013     Procedure: Dr. Kitzmiller at 1:30pm-placement of bilateral tissue expanders;  Surgeon: William J Kitzmiller, MD;  Location: [facility];  Service: Plastics;  Laterality: Bilateral;   ??? Dissection axillary Right 06/19/2013     Procedure: -POSS RIGHT AXILLARY NODE DISSECTION;  Surgeon: Jaime D Lewis, MD;  Location: [facility];  Service: General;  Laterality: Right;   ??? Breast surgery  06/19/13     immediate bilateral TE              Family History:     Family History   Problem Relation Age of Onset   ??? Diabetes Mother    ??? Hypertension Mother    ??? Coronary artery disease Mother    ??? Heart disease Mother      Triple Bypass   ??? COPD Mother    ??? Hyperlipidemia Mother    ??? Coronary artery disease Father    ??? Hyperlipidemia Father    ??? COPD Father    ??? Heart failure Father      CHF   ??? Emphysema Father    ??? Other Sister      Blood Disorder   ??? COPD Sister    ??? Psoriasis Sister    ??? Heart attack Brother    ??? COPD Brother    ??? Heart disease Brother      1 brother -Triple Bypass   ??? Other Brother      Heart Defect, Open heart surgery   ??? Psoriasis Brother    ??? Breast cancer Other    ??? COPD Other    ??? Breast cancer Other    ??? Breast cancer Paternal Aunt    ??? Eczema Neg Hx    ??? Melanoma Neg Hx        Social History:     History     Social History   ??? Marital Status: Divorced     Spouse Name: N/A     Number of Children: N/A   ??? Years of Education: N/A     Occupational History   ??? Not on file.     Social History Main Topics   ??? Smoking status: Former Smoker   ??? Smokeless tobacco: Never Used      Comment: 02/23/2009, no passive smoke exposure 04/15/2009   ??? Alcohol Use: No      Comment: Rare 04-02-13   ??? Drug Use: No      Comment: 04-02-13   ??? Sexual Activity: No     Other Topics Concern   ??? Caffeine Use Yes   ??? Occupational Exposure No   ??? Exercise No   ??? Seat Belt Yes     Social History Narrative   ??? No narrative on file       Allergies:     Allergies   Allergen Reactions   ??? Crestor [Rosuvastatin] Other (See Comments)     Causes tightening in leg   muscles.       Medications:     Prior to Admission medications taking for visit date 11/11/13   Medication Sig Taking? Authorizing Provider   acetaminophen (TYLENOL) 325 MG tablet Take 2 tablets (650 mg total) by mouth every 6 hours as needed for Pain.  Ramon Ruberte Thiele, MD   acyclovir (ZOVIRAX) 5 % cream Apply topically 5 times daily as needed.  Jeanne Kavinsky, MD   alendronate (FOSAMAX) 70 MG tablet TAKE  1 TABLET BY MOUTH EVERY 7 DAYS  Jeanne Kavinsky, MD   atorvastatin (LIPITOR) 40 MG tablet TAKE 1 TABLET BY MOUTH DAILY  Jeanne Kavinsky, MD   cetirizine (ZYRTEC) 10 MG tablet Take 10 mg by mouth daily.  Historical Provider, MD   efinaconazole (JUBLIA) 10 % SolA Apply 1 drop topically daily.  Seth Stephenson, CNP   ergocalciferol (VITAMIN D2) 50,000 unit capsule TAKE 1 CAPSULE BY MOUTH WEEKLY- VIT D #3  Jeanne Kavinsky, MD   glucosamine-chondroitin 500-400 mg tablet Take 1 tablet by mouth 3 times a day.  Historical Provider, MD   magnesium 250 mg Tab Take by mouth.  Historical Provider, MD   montelukast (SINGULAIR) 10 mg tablet TAKE 1 TABLET BY MOUTH AT BEDTIME  Jeanne Kavinsky, MD   omega-3 fatty acids-fish oil (FISH OIL) 360-1,200 mg Cap Take 1 capsule (1,200 mg total) by mouth daily.  Jeanne Kavinsky, MD   omeprazole (PRILOSEC) 40 MG capsule TAKE ONE CAPSULE BY MOUTH DAILY  Jeanne Kavinsky, MD   SUMAtriptan (IMITREX) 100 MG tablet Take one tablet po daily as needed for migraine, may repeat in 2 hours if needed in 24 hours.  Jeanne Kavinsky, MD   tamoxifen (NOLVADEX) 20 MG tablet Take 1 tablet (20 mg total) by mouth daily. Indications: HORMONE RECEPTOR POSITIVE BREAST CANCER  Neetu Radhakrishnan, MD        Review of Systems   Constitutional: Positive for fever, activity change and fatigue. Negative for weight loss, weight gain and appetite change.        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 Negative for dental problem and trouble swallowing.         Bilateral hearing aids    Several extracted teeth   Eyes: Positive for visual disturbance. Negative for photophobia, pain, redness and itching.        S/p lasik surgery in 2002, only using reading glasses presently   Respiratory: Negative for cough,  choking, chest tightness and shortness of breath.         Has a h/o OSA, but has not used a CPAP machine - she tried it and it did not work for her: she had headaches and awakened all night.  She saw no improvement in her sleep.    Cardiovascular: Negative for chest pain, palpitations and leg swelling.        Denies h/o CP ever. She had a stress ECHO in 08/2009 for CP. She reports occasional CP since then, sharp, sudden and lasting a few seconds, this happens only when she is active.  She has not seen a cardiologist since 2011, but this is the same pain. She had bilateral mastectomies w/o additional cardiac w/u and has done well.  Last episode was about 1 month ago when she   was at work.    Gastrointestinal: Positive for heartburn. Negative for nausea, vomiting, abdominal pain, diarrhea, constipation and abdominal distention.        Taking Prilosec daily, but wants to increase this to bid since GERD has increased since starting Tamoxifen       Genitourinary: Negative for dysuria, urgency, frequency, hematuria and difficulty urinating.        Reports she does not feel like her bladder empties - but denies any symptoms of UTI   Musculoskeletal: Positive for back pain. Negative for arthralgias, neck pain and neck stiffness.        Low back pain since starting Tamoxifen   Skin: Negative for rash and wound.   Neurological: Positive for headaches. Negative for dizziness, seizures, syncope, light-headedness and numbness.        Denies CVA/TIA    Last migraine was about several months ago,takes Imitrex prn with relief   Hematological: Does not bruise/bleed easily.        Denies h/o DVT/PE   Psychiatric/Behavioral: Positive for dysphoric mood. Negative for depression and suicidal ideas. The patient is not nervous/anxious.        Objective:   There were no vitals taken for this visit.    Physical Exam   Vitals reviewed.  Constitutional: She is oriented to person, place, and time. She appears well-developed and well-nourished.  No distress.   Body mass index is 36.68 kg/(m^2).     HENT:   Head: Normocephalic and atraumatic.   Right Ear: Hearing and external ear normal.   Left Ear: Hearing and external ear normal.   Nose: Nose normal.   Mouth/Throat: Uvula is midline, oropharynx is clear and moist and mucous membranes are normal.   Eyes: Conjunctivae, EOM and lids are normal. Pupils are equal, round, and reactive to light. Right conjunctiva is not injected. No scleral icterus.   Neck: Trachea normal and normal range of motion. Neck supple. No JVD present. Carotid bruit is not present. No tracheal deviation present. No mass and no thyromegaly present.   Cardiovascular: Normal rate, regular rhythm, S1 normal, S2 normal and normal heart sounds.  Exam reveals no gallop and no friction rub.    No murmur heard.  Pulses:       Carotid pulses are 2+ on the right side, and 2+ on the left side.       Radial pulses are 2+ on the right side, and 2+ on the left side.        Dorsalis pedis pulses are 2+ on the right side, and 2+ on the left side.   Pulmonary/Chest: Effort normal and breath sounds normal. No stridor. No apnea. No respiratory distress. She has no wheezes. She has no rhonchi. She has no rales. She exhibits no tenderness.   Abdominal: Soft. Bowel sounds are normal. She exhibits no distension and no mass. There is no tenderness. There is no rebound and no guarding.   Musculoskeletal: Normal range of motion. She exhibits no edema and no tenderness.   Bilateral = strength   Lymphadenopathy:        Head (right side): No submental, no submandibular, no tonsillar, no preauricular, no posterior auricular and no occipital adenopathy present.        Head (left side): No submental, no submandibular, no tonsillar, no preauricular, no posterior auricular and no occipital adenopathy present.     She has no cervical adenopathy.   Neurological: She is alert and oriented to person, place, and time. She has   normal strength. She displays no tremor. No cranial  nerve deficit or sensory deficit. She exhibits normal muscle tone. Coordination and gait normal.   Skin: Skin is warm and dry. No petechiae, no purpura and no rash noted. She is not diaphoretic. No cyanosis or erythema. No pallor. Nails show no clubbing.   Psychiatric: She has a normal mood and affect. Her speech is normal and behavior is normal. Judgment and thought content normal.       Airway:  Mallampati II (hard and soft palate, upper portion of tonsils anduvula visible), Thyromental distance 3 finger breadths, opening 3 finger breadths; FROM neck, teeth solid      Lab Review:     Lab Results   Component Value Date    WBC 9.9 06/12/2013    HGB 13.0 06/12/2013    HCT 38.2 06/12/2013    MCH 30.7 06/12/2013    PLT 283 06/12/2013    GLUCOSE 88 06/11/2013    GLUCOSE 86 10/08/2010    CREATININE 0.60 06/11/2013    NA 137 06/11/2013    K 4.5 06/11/2013    CL 106 06/11/2013    CO2 24 06/11/2013    GFRNONAFRAM 107 04/13/2011    BILITOT 0.4 08/23/2013    PROT 6.7 08/23/2013    PROT 7.2 09/20/2005    AST 18 04/13/2011    ALT 16 08/23/2013    ALKPHOS 59 08/23/2013    CHOLTOT 135 08/23/2013    LDL 68 08/23/2013    HDL 42 08/23/2013    TRIG 126 08/23/2013     Study Results:      Stress ECHO 2011  LV size, wall thickness and systolic function were normal.  LVEF 55-65%.  Normal wall motion, no regional wall motion abnormalities.    Stress EKG:  No stress arrhythmias or conduction abnormalities,  The stress EKG was negative for ischemia  Impression: normal study after maximal exercise.    ASA Physical Status:  3     Assessment and Recommendations:   The patient is a 58 yo woman with h/o breast cancer on (R) who is s/p bilateral mastectomies and is having an exchange bilateral tissue expander for implant under [place].  Medical issues include:    1. Cardiac / Functional status:  Denies h/o CAD/CHF.  She had a stress ECHO2011 for CP that is mentioned in Dr. Leesar's note:  Stress echo demonstrated normal LV function and there was no evidence for ischemia. She continues  to have occasional CP that is identical to the CP she had in 2011:  A sudden, sharp pain that immediately resolves, usually happens at work. Most recently this was several months ago.  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  Patient is physically active duke level 4 with no ne CP or dyspnea.  Risk factors include OSA, HLD.  Physical exam is unremarkable.  No additional workup is indicated for this procedure.    2.  HLD:  Taking Lipitor, advised to continue periop.    3. Obesity / OSA: Body mass index is 36.68 kg/(m^2).  She has not used her CPAP for several years.     4.  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 She remains on Tamoxifen and will hold this for 1 week prior to her surgery and for 1 week after per her   oncologist instructions.    5.  GERD: symptoms managed with Prilosec, advised to continue periop and take this on the day of surgery.    Today I reviewed labs of 08/2013 ; no new labs are indicated.  Pre-procedural instructions given, patient verbalized understanding.    J. Bierschbach, CNP

## 2013-11-11 NOTE — Unmapped (Addendum)
We???re pleased that you have chosen [facility] for your upcoming procedure. We encourage you to ask questions. We want your visit to be as comfortable as possible.      Your procedure is scheduled on 11/17 at 0730 AM.  Please arrive at 0600 AM and check in at the surgery waiting room.      DO NOT EAT OR DRINK ANYTHING (including gum, mints, water, etc.) after midnight the night before your procedure.  You may brush your teeth and gargle on the morning of surgery, but do not swallow any water.    Follow your oncologist pre op instructions related to Tamoxifen.          YOU MAY TAKE YOUR GERD, SEIZURE, ANXIETY/DEPRESSION AND PAIN MEDICATIONS, WITH A SMALL SIP OF WATER, DAY OF SURGERY, AS PRESCRIBED          BY YOUR PHYSICIANS.        FOLLOW YOUR PHYSICIAN'S INSTRUCTIONS CONCERNING DISCONTINUING ASPIRIN, IBUPROFEN, NSAIDS, SUPPLEMENTS, FISH OIL, VITAMINS, AND HERBAL        SUPPLEMENTS.    Please make transportation arrangements and bring a responsible adult to accompany you home and remain with you for 24 hours.    Leave valuables (money, jewelry, credit cards) at home.  If you wear glasses or contacts, bring a case for safekeeping.    Wear casual, loose fitting, and comfortable clothing.  A gown will be provided.  If you are staying overnight, bring a small overnight bag.  (Storage space is limited.)    Please remove all makeup, jewelry, body piercings, powder, perfume, and nail polish before you arrive.    Bring a list of your medications and dose including herbal.  Do not bring any pills or medications to the surgery center. (Exception: transplant patients.)    Bring a photo ID and your insurance card so we can bill your insurance company directly.    Please do not bring any children under the age of 25 to the surgery center.    Do not shave in the area of the surgery for 2 days prior to surgery.  If needed, a trained staff member will clip the area immediately before your surgery.    Quit smoking as  far in advance of surgery as possible.  Patients who quit at least 30 days before surgery may have better outcomes.    If you are diabetic, pay close attention to your blood sugar and try to keep it in the range your doctor wants it to be in.      Discuss discontinuing herbal medications with your doctor before surgery.    Talk to your doctor about taking medication such as Aspirin, Plavix, Pradaxa or Coumadin before surgery.    Please shower at home the evening before and the morning of surgery using an antibacterial soap.    If you have a cold or are sick prior to surgery, contact your doctor before surgery.    Additional instructions:    Make sure all of your health care givers are checking your ID bracelet and verifying your name and date of birth.  You will actively be involved in verifying the type of surgery you are having and the correct site.  Your health care givers should be cleaning their hands with soap and water or antibacterial foam before taking care of you and if they do not it is ok to remind them to do so.      Antibacterial showering and good hand  hygiene are essential to prevent surgical site infections.    Patient given educational information/material on preventing surgical site infections.    Patient verbalized understanding of these instructions.    Our address is [address]  [place], [place] [address]      Our phone number is [phone]    Contact information:    [facility] Pre-admission Testing, Monday - Friday 8:00 am - 4:30 pm, [phone].

## 2013-11-11 NOTE — Unmapped (Signed)
Chief Complaint   Patient presents with   ??? Pre-op Exam     Exchange bilateral tissue expander for permanent implants scheduled for 11/26/13        History of Present Illness  Sarah Huang is a 58 y.o. female patient of Dr. Jens Som.  She has previously undergone bilateral mastectomies with placement of tissue expanders.  She has completed expansion and her resting period and would like to pursue exchange for implants. She is currently scheduled for 11/26/13.  Given Dr. Ludwig Clarks medical problems, she has opted to see one of his partners, namely myself, in order to proceed with surgery and keep her surgery date.  She denies any problems with symptoms concerning for infection ie fevers, redness or swelling of the breasts.  She reports daily chills and low grade temps, which she attributes to starting Tamoxifen.  She has some soreness of the chest from the expanders when sleeping.  She is pleased with the size to which the expanders have been inflated.       Review of Systems   Constitutional: Positive for chills and fatigue. Negative for fever.   Respiratory: Negative for shortness of breath.    Cardiovascular: Negative for chest pain.   Gastrointestinal: Negative for nausea, vomiting, abdominal pain, diarrhea and constipation.   Skin: Negative for color change, rash and wound.   All other systems reviewed and are negative.      [x]  I have reviewed the ROS and agree as documented.    Allergies  Crestor    Medications  Outpatient Encounter Prescriptions as of 11/11/2013   Medication Sig Dispense Refill   ??? acetaminophen (TYLENOL) 325 MG tablet Take 2 tablets (650 mg total) by mouth every 6 hours as needed for Pain.  40 tablet  0   ??? acyclovir (ZOVIRAX) 5 % cream Apply topically 5 times daily as needed.  15 g  1   ??? alendronate (FOSAMAX) 70 MG tablet TAKE 1 TABLET BY MOUTH EVERY 7 DAYS  12 tablet  3   ??? atorvastatin (LIPITOR) 40 MG tablet TAKE 1 TABLET BY MOUTH DAILY  30 tablet  11   ??? cetirizine (ZYRTEC) 10  MG tablet Take 10 mg by mouth daily.       ??? efinaconazole (JUBLIA) 10 % SolA Apply 1 drop topically daily.  8 mL  11   ??? ergocalciferol (VITAMIN D2) 50,000 unit capsule TAKE 1 CAPSULE BY MOUTH WEEKLY- VIT D #3       ??? glucosamine-chondroitin 500-400 mg tablet Take 1 tablet by mouth 3 times a day.       ??? magnesium 250 mg Tab Take by mouth.       ??? montelukast (SINGULAIR) 10 mg tablet TAKE 1 TABLET BY MOUTH AT BEDTIME  30 tablet  5   ??? omega-3 fatty acids-fish oil (FISH OIL) 360-1,200 mg Cap Take 1 capsule (1,200 mg total) by mouth daily.  30 capsule  5   ??? omeprazole (PRILOSEC) 40 MG capsule TAKE ONE CAPSULE BY MOUTH DAILY  30 capsule  11   ??? SUMAtriptan (IMITREX) 100 MG tablet Take one tablet po daily as needed for migraine, may repeat in 2 hours if needed in 24 hours.  12 tablet  2   ??? tamoxifen (NOLVADEX) 20 MG tablet Take 1 tablet (20 mg total) by mouth daily. Indications: HORMONE RECEPTOR POSITIVE BREAST CANCER  30 tablet  3     No facility-administered encounter medications on file as of 11/11/2013.  Histories  She has a past medical history of Hyperlipidemia; Kidney stones; Depression; Dyslipidemia; Sleep apnea (06/17/2013); GERD (gastroesophageal reflux disease); Constipation (06/17/2013); Dermatitis; Psoriasis; and Cancer (06/2013).    She has past surgical history that includes Tonsillectomy; Salpingoophorectomy (2004); Sinus surgery; mid urethral sling  (2002); Esophagogastroduodenoscopy (N/A, 05/29/2012); Colonoscopy (N/A, 05/29/2012); Colonoscopy (N/A, 06/27/2012); Hysterectomy (0220); remove breast tissue expanders insertion implants (Bilateral, 06/19/2013); dissection axillary (Right, 06/19/2013); and Breast surgery (06/19/13).    Her family history includes Breast cancer in her other, other, and paternal aunt; COPD in her brother, father, mother, other, and sister; Coronary artery disease in her father and mother; Diabetes in her mother; Emphysema in her father; Heart attack in her brother; Heart  disease in her brother and mother; Heart failure in her father; Hyperlipidemia in her father and mother; Hypertension in her mother; Other in her brother and sister; Psoriasis in her brother and sister. There is no history of Eczema or Melanoma.    She reports that she quit smoking about 24 years ago. She has never used smokeless tobacco. She reports that she does not drink alcohol or use illicit drugs.    Blood pressure 150/82, pulse 94, height 5' 3.5 (1.613 m), weight 208 lb (94.348 kg).    Physical Exam   Vitals reviewed.  Constitutional: She is oriented to person, place, and time. She appears well-developed and well-nourished. No distress.   HENT:   Head: Normocephalic and atraumatic.   Eyes: EOM are normal. Right eye exhibits no discharge. Left eye exhibits no discharge. No scleral icterus.   Neck: Normal range of motion. Neck supple.   Cardiovascular: Normal rate and regular rhythm.    Pulmonary/Chest: Effort normal.   bilat breasts with well-healed incisions, no erythema, no palp fluid collections; tissue expanders filled with fluid; no apprec masses   Abdominal: Soft. She exhibits no distension. There is no tenderness. There is no rebound and no guarding.   Musculoskeletal: Normal range of motion. She exhibits no edema and no tenderness.   Neurological: She is alert and oriented to person, place, and time.   Skin: Skin is warm and dry. No rash noted. No erythema.   Psychiatric: She has a normal mood and affect. Her behavior is normal. Judgment and thought content normal.            Assessment  58 yo w s/p bilateral mastectomies and placement of tissue expanders for immediate breast reconstruction seeking replacement of her expanders with silicone implants.    Plan  1. The risks, benefits, indications and alternatives were discussed including bleeding, infection, scarring, damage to adjacent structures, need for additional procedures, poor wound healing, hematoma, seroma, asymmetry, poor aesthetic outcome,  capsular contracture and numbness.  The patient expressed understanding and a willingness to proceed with surgery and desire to keep her date of 11/26/13. She would like to have the expanders replaced for implants of an equivalent size with simultaneous excision of excess lateral tissue bilaterally.  Will proceed according to Dr. Ludwig Clarks plan.  2. The patient reports sensitivity to medications and especially narcotics and requests only 800mg  Ibuprofen for pain control post-operatively.  She states that she does not need narcotic pain medication, tramadol or stool softeners post-operatively.Marland Kitchen  She will also require PO abx post-operatively.           Medical Decision Making  The following items were considered in medical decision making:  Pre-op discussion and H&P    Risks & Benefits:  Risks, benefits and treatment options discussed  with patient.    Time Spent With Patient:  Time spent with patient:   Time spent discussing diagnosis, management, and treatment plan: > 25 minutes

## 2013-11-11 NOTE — Unmapped (Addendum)
PRE-OPERATIVE HISTORY AND PHYSICAL       [facility] NP / PA:   Claria Dice, NP    Date of Surgery:  November 26, 2013  Surgeon:  Dr. Doristine Counter  Diagnosis:  Acquired absence of breast and nipple  Procedure:  Exchange bilateral tissue expander for implant    Patient ID: Sarah Huang is a 58 y.o. female.    Patient is being seen today at the request of Dr. Doristine Counter to render an opinion on perioperative risk optimization and to coordinate medical care as necessary prior to the following procedure:  Exchange bilateral tissue expander for implant.    Chief Complaint   Patient presents with   ??? Pre-op Exam     acquired absence of breast and nipple       History of Present Illness:  The patient is a 58 yo woman with a PMHx of HLD, depression, sleep apnea, GERD and breast cancer.  She had a screening mammogram in 04/2013 and was found to have several changes in both breasts.  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  The (L) breast lesion was a cyst which was aspirated and resolved.  She underwent bilateral skin-sparing total mastectomies ( (L) was prophylactic) on 06/19/13 at which time she had bilateral tissue expanders placed.  Since then she c/o a great deal of discomfort from the tissue expanders which she treats with Ibuprofen.  Currently she reports pain 5/10 when she is working and the expanders shift around, improves with lying still/keeping still, worse with any movement causing a shift in the expanders.     Chronic Medical Conditions, Severity, Optimization:    1. Cardiac / Functional status:  Denies h/o CAD/CHF.  She had a stress ECHO 08/12/2009 for CP that is mentioned in Dr. Charlesetta Garibaldi note:  Stress echo demonstrated normal LV function and there was no evidence for ischemia,. However we are unable to find the report.   2.  HLD:  Taking Lipitor.  3. Obesity / OSA: Body mass index is 36.68 kg/(m^2).  No CPAP  4. 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   5.  GERD: symptoms managed with Prilosec.    D[facility] Activity Scale:  4 - Raking leaves; weeding or pushing a power mower.      Medical History:     Past Medical History   Diagnosis Date   ??? Hyperlipidemia    ??? Kidney stones    ??? Depression    ??? Dyslipidemia    ??? Sleep apnea 06/17/2013     not using C-PAP   ??? GERD (gastroesophageal reflux disease)    ??? Constipation 06/17/2013     IIBS   ??? Dermatitis    ??? Psoriasis    ??? Cancer 06/2013     (R) breast       Surgical History:     Past Surgical History   Procedure Laterality Date   ??? Tonsillectomy       in the past   ??? Salpingoophorectomy  2004     Hysty/BSO-etopic pregnancy/prolapse   ??? Sinus surgery       hx sinus surg x4   ??? Mid urethral sling   2002     at time of [facility]   ??? Esophagogastroduodenoscopy N/A 05/29/2012     Procedure: EGD;  Surgeon: Langley Adie, MD;  Location: [facility];  Service: Gastroenterology;  Laterality: N/A;   ??? Colonoscopy N/A 05/29/2012     Procedure: COLONOSCOPY W/ OR W/O BIOPSY;  Surgeon: Langley Adie, MD;  Location: [facility];  Service: Gastroenterology;  Laterality: N/A;  limited colonoscopy.   ??? Colonoscopy N/A 06/27/2012     Procedure: COLONOSCOPY W/ OR W/O BIOPSY;  Surgeon: Langley Adie, MD;  Location: [facility];  Service: Gastroenterology;  Laterality: N/A;  colonoscopy   ??? Hysterectomy  0220     Hysty/BSO 2004-etopic pregnancy/prolapse   ??? Remove breast tissue expanders insertion implants Bilateral 06/19/2013     Procedure: Dr. Jens Som at 1:30pm-placement of bilateral tissue expanders;  Surgeon: Yolanda Manges, MD;  Location: [facility];  Service: Plastics;  Laterality: Bilateral;   ??? Dissection axillary Right 06/19/2013     Procedure: -POSS RIGHT AXILLARY NODE DISSECTION;  Surgeon: Cristal Generous, MD;  Location: [facility];  Service: General;  Laterality: Right;   ??? Breast surgery  06/19/13     immediate bilateral TE              Family History:     Family History   Problem Relation Age of Onset   ??? Diabetes Mother    ??? Hypertension Mother    ??? Coronary artery disease Mother    ??? Heart disease Mother      Triple Bypass   ??? COPD Mother    ??? Hyperlipidemia Mother    ??? Coronary artery disease Father    ??? Hyperlipidemia Father    ??? COPD Father    ??? Heart failure Father      CHF   ??? Emphysema Father    ??? Other Sister      Blood Disorder   ??? COPD Sister    ??? Psoriasis Sister    ??? Heart attack Brother    ??? COPD Brother    ??? Heart disease Brother      1 brother -Triple Bypass   ??? Other Brother      Heart Defect, Open heart surgery   ??? Psoriasis Brother    ??? Breast cancer Other    ??? COPD Other    ??? Breast cancer Other    ??? Breast cancer Paternal Aunt    ??? Eczema Neg Hx    ??? Melanoma Neg Hx        Social History:     History     Social History   ??? Marital Status: Divorced     Spouse Name: N/A     Number of Children: N/A   ??? Years of Education: N/A     Occupational History   ??? Not on file.     Social History Main Topics   ??? Smoking status: Former Smoker   ??? Smokeless tobacco: Never Used      Comment: 02/23/2009, no passive smoke exposure 04/15/2009   ??? Alcohol Use: No      Comment: Rare 04-02-13   ??? Drug Use: No      Comment: 04-02-13   ??? Sexual Activity: No     Other Topics Concern   ??? Caffeine Use Yes   ??? Occupational Exposure No   ??? Exercise No   ??? Seat Belt Yes     Social History Narrative   ??? No narrative on file       Allergies:     Allergies   Allergen Reactions   ??? Crestor [Rosuvastatin] Other (See Comments)     Causes tightening in leg  muscles.       Medications:     Prior to Admission medications taking for visit date 11/11/13   Medication Sig Taking? Authorizing Provider   acetaminophen (TYLENOL) 325 MG tablet Take 2 tablets (650 mg total) by mouth every 6 hours as needed for Pain.  Pricilla Holm, MD   acyclovir (ZOVIRAX) 5 % cream Apply topically 5 times daily as needed.  Marjory Sneddon, MD   alendronate (FOSAMAX) 70 MG tablet TAKE  1 TABLET BY MOUTH EVERY 7 DAYS  Marjory Sneddon, MD   atorvastatin (LIPITOR) 40 MG tablet TAKE 1 TABLET BY MOUTH DAILY  Marjory Sneddon, MD   cetirizine (ZYRTEC) 10 MG tablet Take 10 mg by mouth daily.  Historical Provider, MD   efinaconazole (JUBLIA) 10 % SolA Apply 1 drop topically daily.  Jolene Provost, CNP   ergocalciferol (VITAMIN D2) 50,000 unit capsule TAKE 1 CAPSULE BY MOUTH WEEKLY- VIT D #3  Marjory Sneddon, MD   glucosamine-chondroitin 500-400 mg tablet Take 1 tablet by mouth 3 times a day.  Historical Provider, MD   magnesium 250 mg Tab Take by mouth.  Historical Provider, MD   montelukast (SINGULAIR) 10 mg tablet TAKE 1 TABLET BY MOUTH AT BEDTIME  Marjory Sneddon, MD   omega-3 fatty acids-fish oil (FISH OIL) 360-1,200 mg Cap Take 1 capsule (1,200 mg total) by mouth daily.  Marjory Sneddon, MD   omeprazole (PRILOSEC) 40 MG capsule TAKE ONE CAPSULE BY MOUTH DAILY  Marjory Sneddon, MD   SUMAtriptan (IMITREX) 100 MG tablet Take one tablet po daily as needed for migraine, may repeat in 2 hours if needed in 24 hours.  Marjory Sneddon, MD   tamoxifen (NOLVADEX) 20 MG tablet Take 1 tablet (20 mg total) by mouth daily. Indications: HORMONE RECEPTOR POSITIVE BREAST CANCER  Tresa Endo, MD        Review of Systems   Constitutional: Positive for fever, activity change and fatigue. Negative for weight loss, weight gain and appetite change.        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 Negative for dental problem and trouble swallowing.         Bilateral hearing aids    Several extracted teeth   Eyes: Positive for visual disturbance. Negative for photophobia, pain, redness and itching.        S/p lasik surgery in 2002, only using reading glasses presently   Respiratory: Negative for cough,  choking, chest tightness and shortness of breath.         Has a h/o OSA, but has not used a CPAP machine - she tried it and it did not work for her: she had headaches and awakened all night.  She saw no improvement in her sleep.    Cardiovascular: Negative for chest pain, palpitations and leg swelling.        Denies h/o CP ever. She had a stress ECHO in 08/2009 for CP. She reports occasional CP since then, sharp, sudden and lasting a few seconds, this happens only when she is active.  She has not seen a cardiologist since 2011, but this is the same pain. She had bilateral mastectomies w/o additional cardiac w/u and has done well.  Last episode was about 1 month ago when she  was at work.    Gastrointestinal: Positive for heartburn. Negative for nausea, vomiting, abdominal pain, diarrhea, constipation and abdominal distention.        Taking Prilosec daily, but wants to increase this to bid since GERD has increased since starting Tamoxifen       Genitourinary: Negative for dysuria, urgency, frequency, hematuria and difficulty urinating.        Reports she does not feel like her bladder empties - but denies any symptoms of UTI   Musculoskeletal: Positive for back pain. Negative for arthralgias, neck pain and neck stiffness.        Low back pain since starting Tamoxifen   Skin: Negative for rash and wound.   Neurological: Positive for headaches. Negative for dizziness, seizures, syncope, light-headedness and numbness.        Denies CVA/TIA    Last migraine was about several months ago,takes Imitrex prn with relief   Hematological: Does not bruise/bleed easily.        Denies h/o DVT/PE   Psychiatric/Behavioral: Positive for dysphoric mood. Negative for depression and suicidal ideas. The patient is not nervous/anxious.        Objective:   There were no vitals taken for this visit.    Physical Exam   Vitals reviewed.  Constitutional: She is oriented to person, place, and time. She appears well-developed and well-nourished.  No distress.   Body mass index is 36.68 kg/(m^2).     HENT:   Head: Normocephalic and atraumatic.   Right Ear: Hearing and external ear normal.   Left Ear: Hearing and external ear normal.   Nose: Nose normal.   Mouth/Throat: Uvula is midline, oropharynx is clear and moist and mucous membranes are normal.   Eyes: Conjunctivae, EOM and lids are normal. Pupils are equal, round, and reactive to light. Right conjunctiva is not injected. No scleral icterus.   Neck: Trachea normal and normal range of motion. Neck supple. No JVD present. Carotid bruit is not present. No tracheal deviation present. No mass and no thyromegaly present.   Cardiovascular: Normal rate, regular rhythm, S1 normal, S2 normal and normal heart sounds.  Exam reveals no gallop and no friction rub.    No murmur heard.  Pulses:       Carotid pulses are 2+ on the right side, and 2+ on the left side.       Radial pulses are 2+ on the right side, and 2+ on the left side.        Dorsalis pedis pulses are 2+ on the right side, and 2+ on the left side.   Pulmonary/Chest: Effort normal and breath sounds normal. No stridor. No apnea. No respiratory distress. She has no wheezes. She has no rhonchi. She has no rales. She exhibits no tenderness.   Abdominal: Soft. Bowel sounds are normal. She exhibits no distension and no mass. There is no tenderness. There is no rebound and no guarding.   Musculoskeletal: Normal range of motion. She exhibits no edema and no tenderness.   Bilateral = strength   Lymphadenopathy:        Head (right side): No submental, no submandibular, no tonsillar, no preauricular, no posterior auricular and no occipital adenopathy present.        Head (left side): No submental, no submandibular, no tonsillar, no preauricular, no posterior auricular and no occipital adenopathy present.     She has no cervical adenopathy.   Neurological: She is alert and oriented to person, place, and time. She has  normal strength. She displays no tremor. No cranial  nerve deficit or sensory deficit. She exhibits normal muscle tone. Coordination and gait normal.   Skin: Skin is warm and dry. No petechiae, no purpura and no rash noted. She is not diaphoretic. No cyanosis or erythema. No pallor. Nails show no clubbing.   Psychiatric: She has a normal mood and affect. Her speech is normal and behavior is normal. Judgment and thought content normal.       Airway:  Mallampati II (hard and soft palate, upper portion of tonsils anduvula visible), Thyromental distance 3 finger breadths, opening 3 finger breadths; FROM neck, teeth solid      Lab Review:     Lab Results   Component Value Date    WBC 9.9 06/12/2013    HGB 13.0 06/12/2013    HCT 38.2 06/12/2013    MCH 30.7 06/12/2013    PLT 283 06/12/2013    GLUCOSE 88 06/11/2013    GLUCOSE 86 10/08/2010    CREATININE 0.60 06/11/2013    NA 137 06/11/2013    K 4.5 06/11/2013    CL 106 06/11/2013    CO2 24 06/11/2013    GFRNONAFRAM 107 04/13/2011    BILITOT 0.4 08/23/2013    PROT 6.7 08/23/2013    PROT 7.2 09/20/2005    AST 18 04/13/2011    ALT 16 08/23/2013    ALKPHOS 59 08/23/2013    CHOLTOT 135 08/23/2013    LDL 68 08/23/2013    HDL 42 08/23/2013    TRIG 126 08/23/2013     Study Results:      Stress ECHO 2011  LV size, wall thickness and systolic function were normal.  LVEF 55-65%.  Normal wall motion, no regional wall motion abnormalities.    Stress EKG:  No stress arrhythmias or conduction abnormalities,  The stress EKG was negative for ischemia  Impression: normal study after maximal exercise.    ASA Physical Status:  3     Assessment and Recommendations:   The patient is a 58 yo woman with h/o breast cancer on (R) who is s/p bilateral mastectomies and is having an exchange bilateral tissue expander for implant under [place].  Medical issues include:    1. Cardiac / Functional status:  Denies h/o CAD/CHF.  She had a stress [id number] for CP that is mentioned in Dr. Charlesetta Garibaldi note:  Stress echo demonstrated normal LV function and there was no evidence for ischemia. She continues  to have occasional CP that is identical to the CP she had in 2011:  A sudden, sharp pain that immediately resolves, usually happens at work. Most recently this was several months ago.  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  Patient is physically active duke level 4 with no ne CP or dyspnea.  Risk factors include OSA, HLD.  Physical exam is unremarkable.  No additional workup is indicated for this procedure.    2.  HLD:  Taking Lipitor, advised to continue periop.    3. Obesity / OSA: Body mass index is 36.68 kg/(m^2).  She has not used her CPAP for several years.     4.  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 She remains on Tamoxifen and will hold this for 1 week prior to her surgery and for 1 week after per her  oncologist instructions.    5.  GERD: symptoms managed with Prilosec, advised to continue periop and take this on the day of surgery.    Today I reviewed labs of 08/2013 ; no new labs are indicated.  Pre-procedural instructions given, patient verbalized understanding.    Ezequiel Kayser, CNP

## 2013-11-11 NOTE — Unmapped (Signed)
Results were faxed to [facility] @ [phone].

## 2013-11-11 NOTE — Unmapped (Signed)
Sarah Huang calling to get a stress echo results from 2011. Sarah Huang can be reached at [phone]

## 2013-11-11 NOTE — Unmapped (Signed)
POST-SURGERY INSTRUCTIONS: BREAST    Please use these guidelines to help understand the healing process after surgery.  As always, if you have questions please call the office.    TYPICAL POST-OPERATIVE SYMPTOMS    Typical symptoms of breast surgery and signs to watch for following surgery with implants include the following:    Tightness in the chest region and stiffness; Tingling, burning or intermittent shooting pain:  These are normal experiences as the skin, muscles and tissue stretch to accommodate your implants, and as sensory nerves heal.  Pain medication will help you cope with any discomfort. If you have drains, you may experience additional localized discomfort. Consistent sharp pain should be reported to our office immediately.    A feeling of heaviness: It is normal for your chest to feel heavy as you adjust to your implants. This will subside within 2-4 weeks following surgery.    Shiny skin or any itchy feeling: Swelling can cause the breasts skin to appear shiny. As the healing process advances, you may also find a mild to severe itchy feeling of the breasts.  An antihistamine like Benadryl can help to alleviate severe, constant itchiness.  If the skin becomes red and hot to the touch, contact our office immediately.    Asymmetry, the breasts look different, or heal differently: Breasts may look or feel quite different from one another in the days following surgery.  This is normal.  Although no two breasts in nature or following surgery are perfectly symmetrical, breast massage and time will produce breasts that are similar in shape, position and size.    CALL THE OFFICE IMMEDIATELY IF YOU EXPERIENCE ANY OF THE FOLLOWING:    ?? A high fever (over 101??), severe nausea and vomiting, continued dizziness, or incoherent behavior such as hallucinations.    ?? Any pain that cannot be controlled by your pain medication.    ?? Bright red skin that is hot to the touch.    ?? Excessive bleeding or fluid seeping  through the incisions.    ?? A severely misshapen breast or bruising that is localized to one breast or region of the chest.    ?? Shortness in breath.    To alleviate any discomfort and to reduce swelling, you may apply cool, not cold compresses to the treated region.  Crushed ice or ice packs must be wrapped in a towel before being applied to the skin.  Do not apply ice or anything frozen directly to the skin.  Apply cool compresses, for no longer than 20-minute intervals.      DAY OF SURGERY INSTRUCTIONS    You will only be released to the care of a responsible adult.  All of these instructions must be clear to the adult who will monitor your health and support you around the clock in the first 24 hours following surgery.      Rest, but not bed rest:  While rest is important in the early stages of healing, equally important is that you are ambulatory, meaning that you are walking under your own strength.  Spend 10 minutes every 2 hours engaged in light walking indoors as your recover.        Recline with your head and chest slightly elevated above your lower body.        Good nutrition: Fluids are critical following surgery. Stick to non-carbonated, non-alcoholic, caffeine-free and green tea-free beverages including fruit juices and water, milk and yogurt drinks.  You must consume at least  8 ounces of fluid every 2 hours. Stick with soft, bland, nutritious food for the first 24 hours.      Your incision dressings.  Your incisions will seep fluid and some blood for a short time after surgery.  Do not remove any steri-strips.          Wear a support bra or your surgical garment around the clock. Follow your surgeon???s instructions specifically and wear this garment at all times.        Do not smoke.  Smoking can greatly impair your safety prior to surgery and your ability to heal following surgery. You must not smoke.        Relax.   Do not engage in any stressful activities. Do not lift your hands over your head.  Do  not lift anything heavier than a paperback book.  Do not take care of anyone else; let others tend to you.           TWO TO SEVEN DAYS FOLLOWING SURGERY    During this time you will progress with each day that passes.  Ease into your daily activities.  You will receive clearance to begin driving or return to work at your post-operative visit.    ?? Care for your incisions; you may shower.  Take a warm, not hot shower.  Do not take a bath.  Limit your shower to 10 minutes.  Do not remove any steri-strips.  Do not rub your incisions.  Keep dressings clean and dry.  Steri-strips will usually remain in place for 5 days or until your first post op visit.    You may wash over steri-strips.  The incisions may be washed with soap and water, patted dry or allow to air dry.  If steri-strips become loose, the incision will still be supported by deep dissolvable sutures.  If the steri-strips are off, apply light coating of antibiotic ointment may be applied over any crusted or raw areas.  Apply a fragrance free moisturizer to breast and surrounding skin, however not on your incisions.    ?? Take all medications exactly as prescribed. Take pain medication only as needed. You may wish to switch from prescription pain medication to acetaminophen or ibuprofen.    ?? Wear your bra around the clock.    ?? Do not resume any exercise other than regular walking. Walking every day is essential to prevent the formation of blood clots.    ?? Maintain a healthy diet. Do not smoke. Do not consume alcohol.    ONE TO FOUR WEEKS FOLLOWING SURGERY    As you resume your normal daily activities, you must continue proper care and healing.    ?? Continue your wound care as directed.      ?? Refrain from weight-bearing exercise, twisting, or lifting anything over your head. No tennis, golf, softball or other sports with similar swinging motions.  Avoid aerobic exercise that may cause a lot of bounce. You may begin range of motion exercises, but not with any  weight, pressure or resistance of any kind.    ?? Do not smoke. While incisions may have sealed, smoking deprives your body of necessary oxygen that can result in poorly healed, wide, raised scars.    ?? Continue to wear a proper support bra. The bra you first wore following surgery may feel somewhat loose. You may replace it, however, no under wires for 6 weeks.  You may sleep without a bra; however, a camisole with built-in  shelf support can be comfortable and provides added support as you continue to heal.    ?? You may sleep flat.  However, do not sleep on your stomach.  If you are a side sleeper, a soft pillow under your mid-back and shoulders may offer more comfort and support than a single pillow under your head.    ?? Practice good sun protection. Do not expose your breasts to direct sunlight. If you are outdoors, apply at least an SPF 30 to the chest area at least 30 minutes prior to sun exposure.  Your chest region and breast skin are highly susceptible to sunburn or the formation of irregular, darkened pigmentation.      SIX WEEKS FOLLOWING SURGERY    Healing will progress and your breasts will settle into a more final shape and position.    ?? You may ease into your regular fitness routine. However, realize that your upper body may require some time to return to prior strength.    ?? You may resume wearing under wires, although these are not necessary.    ?? Discomfort or tightness and tingling will resolve. Any lingering nipple sensitivity or lack of sensation should begin to greatly improve.     ?? No need to resume smoking. You have now gone 10 weeks (4 weeks prior to surgery and 6 weeks following) without a cigarette. For your long-term health, there is no need to resume smoking.    YOUR FIRST YEAR    ?? Continue healthy nutrition, fitness and sun protection.     ?? Your scars will continue to refine. If they become raised, red or thickened, or appear to widen, contact our office. Early intervention is  important to achieving well-healed scars. Scars are generally refined to fine incision lines one year after surgery.    ?? If your breasts develop an unusually hard feeling, or a highly rounded ???squeezed??? appearance, call us as soon as possible.  You may be developing capsular contracture. Early treatment is the best solution.    Remember, breast implants are not lifetime devices.      If your implants should rupture, or you suspect an implant is leaking, call our office as soon as possible.      In addition, you should discuss antibiotic options with our office if you plan on having any extensive dental work or any invasive procedure at any time that you have implants in your body.

## 2013-11-12 MED ORDER — ibuprofen (ADVIL,MOTRIN) 800 MG tablet
800 | ORAL_TABLET | Freq: Three times a day (TID) | ORAL | Status: AC | PRN
Start: 2013-11-12 — End: 2014-01-20

## 2013-11-15 NOTE — Unmapped (Signed)
Patient is having side effects from her tamoxifen (NOLVADEX) 20 MG tablet. She is having chills and back hurts. Patient is taking 800 mg Ibuprofen three times a day.

## 2013-11-19 NOTE — Unmapped (Signed)
Left message for patient to call the office regarding side effects from Tamoxifen

## 2013-11-22 ENCOUNTER — Ambulatory Visit: Admit: 2013-11-22 | Payer: PRIVATE HEALTH INSURANCE | Attending: Gerontology

## 2013-11-22 DIAGNOSIS — C50911 Malignant neoplasm of unspecified site of right female breast: Secondary | ICD-10-CM

## 2013-11-22 NOTE — Unmapped (Signed)
Survivorship Care Plan    NEXT SURVIVORSHIP VISIT:   PROVIDER:   SURVEILLANCE/& SCREENING   PROCEDURE FREQUENCY/DATE COMMENTS  WHO & WHERE (when appropriate)       History/Physical Exam Usual follow-up schedule (optional)    Date of next visit:  Any new, unusual and/or persistent symptoms should be brought to the attention of your provider ([facility], 2014).   Mammogram N/A Bilateral mastecomy   Clinical Breast Exam Visit ever 3 months for the first 3 years, then 6 months for another 2 years Bilateral mastecomy   Breast self-exam                  No needed Self exam for lymph nodes under arms and neck   Colonoscopy 2015 Continue all standard-related health care with your primary care provider ([facility], 2014).    GYN (pelvic)   Examination Annually Continue all standard-related health care with your primary care provider ([facility], 2014).   CAT SCAN Date:    PET Scan Date:    Laboratory Procedures Date:       CURRENT & POTENTIAL TREATMENT- RELATED EFFECTS   EFFECT CURRENT POTENTIAL AE SCORE ASSESSMENT/MANAGEMENT   Airway problems   0 Sleep apnea, not on CPAP, referral to sleep medicine   Alopecia (hair loss)   0    Anxiety & Distress   3 Distress Score  (Score of 5 or higher, contact social work)     Cardiovascular Problems   0    Cognitive Dysfunction   0    Decreased Quality of Life   3 Rating 0-10  FACT-G Questionnaire   Depression   3    Dermatologic Problems   0    Economic Problems  e.g. unable to work   0    Endocrine Problems   0    Fatigue   8 Rating 0-10   FACIT-F Questionnaire   Functional Limitations  yes 1-2    GI Problems    GERD on prolisec daily   Infection or at Risk   0    Joint Synovitis  (upper/lower body)    Stiff on joint, on glucosaminide     Hearing altered    Hearing aids, bilateral   Lymphedema  (upper/lower body)   0 L-Dex - UE   Neuropathy   0    Nutritional Problems  yeas  Eats later at work days, 3 days/week   Oral Problems   0    Osteoporosis/ Osteopenia    Hx Osteoporosis    Pain   2-3 Back pain. But  8/10 on Tamoxifen   Pulmonary Problems    no   Reproductive Problems    Hx of TAH BSO   Sexual Health    Divorced, No desire,    Sleep Disorders    Wake up every hour for urination while on Tamoxifen   Vision Altered    Blurry vision with Tamoxifen   Voicing   0      LIFESTYLE BEHAVIORS   BEHAVIOR RECOMMENDATION/MANAGEMENT   Routine Exercise 3-4 times/week, on not working day, 30-50 min brisk walk   Healthy Diet Less fast food, more veggie and fruits; try to not eat late dinner if possible   Alcohol Use socailly   Smoking Cessation Quit 1991, do not restart   Maintaining Immunizations Working as Charity fundraiser, immunizations updated   Genetic Testing done   Sun Precautions      RESOURCES WEBSITE   [employer] Cancer [employer] www.cancer.org   Cancer Care www.cancercare.org  Cancer Family Care www.cancerfamilycare.org   Cancer Support Community www.cancersupportcommunity   [employer] Forward www.[email or web address]   [employer] www.livestrong.org   [employer]) www.cancer.[employer] ([employer]) www.canceradvocacy.org   Patient Designer, television/film set.[email or web address]

## 2013-11-22 NOTE — Unmapped (Signed)
The Treatment Summary provides a brief record of major aspects of cancer treatment.  This is not a complete patient history or comprehensive record of intended therapies.   DEMOGRAPHIC INFORM   [id number] 04-24-1955   Sarah Huang female   White or Caucasian Not Hispanic or Latino   [phone] (home) [phone] (work) Secondary Phone:  [phone]  [email or web address]   Support contact name:  Juliette Mangle Support contact phone:  s   ONCOLOGY TEAM    Name Contact Info   Surgeon (Initial): Dr. Melvyn Neth    Surgeon (Reconstruction): Dr. Katha Hamming    Oncologist (Medical): Dr Graylon Good      Oncologist (Radiation): NA    Provider (Primary Care: Samuella Bruin, MD   [phone]     Provider Advanced Practice:     Other:     GENERAL HISTORY   Family History:  e.g., First-degree relatives:   Genetic Testing History: (e.g. BRCA1,2)      [x]    BRCA1                [x]  BRCA2              []     Major co-morbid conditions:  depression   Tobacco use:    History   Smoking status   ??? Former Smoker   ??? Quit date: 11/11/1989   Smokeless tobacco   ??? 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 Date:  May 10, 2013 Site:  Right breast   TNM Stage:  T1a, N0(i), cM0 Grade:  Breast cancer    Primary site: Breast (Right)    Staging method: AJCC 7th Edition    Clinical: Stage IA (T1b, N0, cM0) - Signed by Cristal Generous, MD on 05/17/2013    Pathologic: Stage IA (T1a, N0(i-), cM0) - Signed by Cristal Generous, MD on 07/02/2013    Summary: Stage IA (T1a, N0(i-), cM0)  SLNB:  negative LND:  Site/Number One lymph node is negative for metastatic carcinoma (0/1) by HE and  immunostain.     Imaging Post Treatment Results:   PET Scan: NA Results:      CAT Scan: NA Results:      TREATMENT   Surgery: bilateral mastectomy                                 Site: bilateral beast                                 Date: 06/19/2013   Chemo:  No Agent:  Start Date:     End Date:   Dosage:  Amt/Unit Type  Reason for reduction or stopping treatment:  None   Radiation: No  Type/Dose:  Start Date:   End Date:     Maintenance Therapy:  Tamoxifen (Nolvadex??) Medication(s):  Tamoxifen    Start Date:  October 04, 2013 Expected Stop Date:  October 05, 2018 Actual Stop Date:  11/16/2013   TREATMENT RELATED EFFECT   worsening Fatigue  and worsening back pain

## 2013-11-25 MED ORDER — rifampin (RIFADIN) 600 mg in sterile water (PF) 20 mL Irrigation
600 | Freq: Once | INTRAVENOUS | Status: AC
Start: 2013-11-25 — End: 2013-11-26

## 2013-11-26 MED ORDER — bupivacaine-epinephrine (PF) (SENSORCAINE) 0.5 %-1:200,000 injection Soln
INTRAMUSCULAR | Status: AC
Start: 2013-11-26 — End: 2013-11-26

## 2013-11-26 MED ORDER — lidocaine-EPINEPHrine 1 %-1:100,000 injection
1 | INTRAMUSCULAR | Status: AC | PRN
Start: 2013-11-26 — End: 2013-11-26
  Administered 2013-11-26: 14:00:00 30 via SUBCUTANEOUS

## 2013-11-26 MED ORDER — propofol 10 mg/ml (DIPRIVAN) injection
10 | INTRAVENOUS | Status: AC | PRN
Start: 2013-11-26 — End: 2013-11-26
  Administered 2013-11-26: 13:00:00 200 via INTRAVENOUS

## 2013-11-26 MED ORDER — ceFAZolin (ANCEF) in D5W 50 mL 2 gram/50 mL
2 | INTRAVENOUS | Status: AC
Start: 2013-11-26 — End: 2013-11-26
  Administered 2013-11-26: 13:00:00 2 via INTRAVENOUS

## 2013-11-26 MED ORDER — promethazine (PHENERGAN) injection 6.25 mg
25 | Freq: Four times a day (QID) | INTRAMUSCULAR | Status: AC | PRN
Start: 2013-11-26 — End: 2013-11-26

## 2013-11-26 MED ORDER — midazolam (PF) (VERSED) 1 mg/mL injection
1 | INTRAMUSCULAR | Status: AC
Start: 2013-11-26 — End: ?

## 2013-11-26 MED ORDER — gentamicin (GARAMYCIN) 40 mg/mL injection
40 | INTRAMUSCULAR | Status: AC
Start: 2013-11-26 — End: 2013-11-26

## 2013-11-26 MED ORDER — midazolam (PF) (VERSED) injection
1 | INTRAMUSCULAR | Status: AC | PRN
Start: 2013-11-26 — End: 2013-11-26
  Administered 2013-11-26: 13:00:00 2 via INTRAVENOUS

## 2013-11-26 MED ORDER — ondansetron (ZOFRAN) 4 mg/2 mL injection
4 | INTRAMUSCULAR | Status: AC
Start: 2013-11-26 — End: ?

## 2013-11-26 MED ORDER — ibuprofenADVILMOTRIN800MGtablet
800 | ORAL_TABLET | Freq: Three times a day (TID) | ORAL | 0.00 refills | 5.00000 days | Status: AC | PRN
Start: 2013-11-26 — End: 2014-01-20

## 2013-11-26 MED ORDER — HYDROmorphone (DILAUDID) injection Syrg 0.6 mg
1 | INTRAMUSCULAR | Status: AC | PRN
Start: 2013-11-26 — End: 2013-11-26

## 2013-11-26 MED ORDER — HYDROmorphone (DILAUDID) injection Syrg
2 | INTRAMUSCULAR | Status: AC | PRN
Start: 2013-11-26 — End: 2013-11-26
  Administered 2013-11-26 (×2): .5 via INTRAVENOUS

## 2013-11-26 MED ORDER — nalOXone (NARCAN) injection 0.04 mg
0.4 | INTRAMUSCULAR | Status: AC | PRN
Start: 2013-11-26 — End: 2013-11-26

## 2013-11-26 MED ORDER — fentaNYL (SUBLIMAZE) injection 50 mcg
50 | INTRAMUSCULAR | Status: AC | PRN
Start: 2013-11-26 — End: 2013-11-26

## 2013-11-26 MED ORDER — gentamicin 160 mg in sodium chloride, irrigation 0.9 % 1,000 mL IRRIGATION
40 | Status: AC | PRN
Start: 2013-11-26 — End: 2013-11-26
  Administered 2013-11-26: 13:00:00 160 mg

## 2013-11-26 MED ORDER — fentaNYL (SUBLIMAZE) 50 mcg/mL injection
50 | INTRAMUSCULAR | Status: AC
Start: 2013-11-26 — End: 2013-11-26
  Administered 2013-11-26: 15:00:00 25 via INTRAVENOUS

## 2013-11-26 MED ORDER — lidocaine (PF) 20 mg/mL (2 %) Soln
20 | INTRAVENOUS | Status: AC | PRN
Start: 2013-11-26 — End: 2013-11-26
  Administered 2013-11-26: 13:00:00 50 via INTRAVENOUS

## 2013-11-26 MED ORDER — fentaNYL (SUBLIMAZE) injection 12.5 mcg
50 | INTRAMUSCULAR | Status: AC | PRN
Start: 2013-11-26 — End: 2013-11-26

## 2013-11-26 MED ORDER — bacitracin 50,000 Units in sodium chloride, irrigation 0.9 % 1,000 mL IRRIGATION
0.9 | Status: AC | PRN
Start: 2013-11-26 — End: 2013-11-26
  Administered 2013-11-26: 13:00:00 50000 [IU]

## 2013-11-26 MED ORDER — fentaNYL (SUBLIMAZE) injection 25 mcg
50 | INTRAMUSCULAR | Status: AC | PRN
Start: 2013-11-26 — End: 2013-11-26

## 2013-11-26 MED ORDER — HYDROmorphone (DILAUDID) 2 mg/mL injection Syrg
2 | INTRAMUSCULAR | Status: AC
Start: 2013-11-26 — End: ?

## 2013-11-26 MED ORDER — HYDROmorphone (DILAUDID) injection Syrg 0.2 mg
1 | INTRAMUSCULAR | Status: AC | PRN
Start: 2013-11-26 — End: 2013-11-26

## 2013-11-26 MED ORDER — HYDROmorphone (DILAUDID) injection Syrg 0.4 mg
1 | INTRAMUSCULAR | Status: AC | PRN
Start: 2013-11-26 — End: 2013-11-26

## 2013-11-26 MED ORDER — fentaNYL (SUBLIMAZE) 50 mcg/mL injection
50 | INTRAMUSCULAR | Status: AC
Start: 2013-11-26 — End: ?

## 2013-11-26 MED ORDER — fentaNYL (SUBLIMAZE) injection
50 | INTRAMUSCULAR | Status: AC | PRN
Start: 2013-11-26 — End: 2013-11-26
  Administered 2013-11-26 (×2): 50 via INTRAVENOUS

## 2013-11-26 MED ORDER — oxyCODONE (ROXICODONE) immediate release tablet 10 mg
5 | Freq: Once | ORAL | Status: AC
Start: 2013-11-26 — End: 2013-11-26

## 2013-11-26 MED ORDER — ondansetron (ZOFRAN) 4 mg/2 mL injection
4 | INTRAMUSCULAR | Status: AC | PRN
Start: 2013-11-26 — End: 2013-11-26
  Administered 2013-11-26: 13:00:00 4 via INTRAVENOUS

## 2013-11-26 MED ORDER — dexamethasone (DECADRON) injection
4 | INTRAMUSCULAR | Status: AC | PRN
Start: 2013-11-26 — End: 2013-11-26
  Administered 2013-11-26: 13:00:00 8 via INTRAVENOUS

## 2013-11-26 MED ORDER — bacitracin 50,000 unit injection
50000 | INTRAMUSCULAR | Status: AC
Start: 2013-11-26 — End: 2013-11-26

## 2013-11-26 MED ORDER — ondansetron (ZOFRAN) 4 mg/2 mL injection 4 mg
4 | Freq: Three times a day (TID) | INTRAMUSCULAR | Status: AC | PRN
Start: 2013-11-26 — End: 2013-11-26

## 2013-11-26 MED ORDER — lidocaine-EPINEPHrine 1 %-1:100,000 injection
1 | INTRAMUSCULAR | Status: AC
Start: 2013-11-26 — End: 2013-11-26

## 2013-11-26 MED ORDER — dexamethasone (DECADRON) 4 mg/mL injection
4 | INTRAMUSCULAR | Status: AC
Start: 2013-11-26 — End: ?

## 2013-11-26 MED ORDER — rifampin (RIFADIN) 600 mg in sterile water (PF) 20 mL Irrigation
600 | INTRAVENOUS | Status: AC | PRN
Start: 2013-11-26 — End: 2013-11-26
  Administered 2013-11-26: 13:00:00 600 mg

## 2013-11-26 MED ORDER — lactated ringers infusion
INTRAVENOUS | Status: AC | PRN
Start: 2013-11-26 — End: 2013-11-26
  Administered 2013-11-26: 12:00:00 via INTRAVENOUS

## 2013-11-26 MED ORDER — cephALEXin (KEFLEX) 500 MG capsule
500 | ORAL_CAPSULE | Freq: Four times a day (QID) | ORAL | Status: AC
Start: 2013-11-26 — End: 2013-12-03

## 2013-11-26 MED ORDER — ceFAZolin (ANCEF) IVPB 2 g in D5W (duplex)
2 | INTRAVENOUS | Status: AC | PRN
Start: 2013-11-26 — End: 2013-11-26

## 2013-11-26 MED ORDER — lactated ringers infusion
INTRAVENOUS | Status: AC
Start: 2013-11-26 — End: 2013-11-26
  Administered 2013-11-26: 12:00:00 50 mL/h via INTRAVENOUS

## 2013-11-26 MED ORDER — sodium chloride, irrigation 0.9 % irrigation
0.9 | Status: AC | PRN
Start: 2013-11-26 — End: 2013-11-26
  Administered 2013-11-26: 13:00:00 1000

## 2013-11-26 MED FILL — BACITRACIN 50,000 UNIT INTRAMUSCULAR SOLUTION: 50000 50,000 unit | INTRAMUSCULAR | Qty: 1

## 2013-11-26 MED FILL — RIFADIN 600 MG INTRAVENOUS SOLUTION: 600 600 mg | INTRAVENOUS | Qty: 600

## 2013-11-26 MED FILL — LIDOCAINE 1 %-EPINEPHRINE 1:100,000 INJECTION SOLUTION: 1 1 %-1:100,000 | INTRAMUSCULAR | Qty: 20

## 2013-11-26 MED FILL — BUPIVACAINE-EPINEPHRINE (PF) 0.5 %-1:200,000 INJECTION SOLUTION: INTRAMUSCULAR | Qty: 30

## 2013-11-26 MED FILL — DEXAMETHASONE SODIUM PHOSPHATE 4 MG/ML INJECTION SOLUTION: 4 4 mg/mL | INTRAMUSCULAR | Qty: 5

## 2013-11-26 MED FILL — GENTAMICIN 40 MG/ML INJECTION SOLUTION: 40 40 mg/mL | INTRAMUSCULAR | Qty: 4

## 2013-11-26 MED FILL — LACTATED RINGERS INTRAVENOUS SOLUTION: 50.00 50.00 mL/hr | INTRAVENOUS | Qty: 1000

## 2013-11-26 MED FILL — HYDROMORPHONE 2 MG/ML INJECTION SYRINGE: 2 2 mg/mL | INTRAMUSCULAR | Qty: 1

## 2013-11-26 MED FILL — CEFAZOLIN 2 GRAM/50 ML IN DEXTROSE (ISO-OSMOTIC) INTRAVENOUS PIGGYBACK: 2 2 gram/50 mL | INTRAVENOUS | Qty: 50

## 2013-11-26 MED FILL — FENTANYL (PF) 50 MCG/ML INJECTION SOLUTION: 50 50 mcg/mL | INTRAMUSCULAR | Qty: 2

## 2013-11-26 MED FILL — ONDANSETRON HCL (PF) 4 MG/2 ML INJECTION SOLUTION: 4 4 mg/2 mL | INTRAMUSCULAR | Qty: 2

## 2013-11-26 MED FILL — MIDAZOLAM (PF) 1 MG/ML INJECTION SOLUTION: 1 1 mg/mL | INTRAMUSCULAR | Qty: 2

## 2013-11-26 NOTE — Unmapped (Signed)
Specific Instructions from Dr. Doristine Counter    1. Please take all antibiotics, if you have any problems, please call the office.  2. Please keep all follow up appointments.  3. Please wear your surgical bra post-operatively.  4. You may remove any dressings and shower beginning 48 hours after surgery.  Do not rub or scrub any incisions.  Pat them dry.    5. No soaking or immersing in pools, tubs, lakes or ponds until your incisions are completely healed.  6. Do not place any lotions, ointments or creams over your incisions unless specifically instructed to do so.  7. No heavy lifting (heavier than a gallon of milk) or strenuous exercise until cleared Dr. Doristine Counter.  8. Try to engage in light activity daily, such as walking.  9. If you experience fever higher than 101.25F, increasing pain, increasing redness, increasing swelling, blistering, drainage concerning for pus or have questions or concerns, please call the office.      Future Appointments  Date Time Provider Department Center   12/03/2013 3:45 PM Yolanda Manges, MD [facility]                 Basic Discharge Instructions    [place] [facility]  POST-SURGERY INSTRUCTIONS: BREAST IMPLANT REMOVAL AND REPLACEMENT  [phone]      TYPICAL POST-OPERATIVE SYMPTOMS  Typical symptoms of breast surgery; and signs to watch for following breast implant removal and replacement with saline filled implants include the following:    Tightness in the chest region and stiffness: Tingling, burning, or intermittent shooting pain.  These are normal experiences as the skin, muscles, and tissue stretch to accommodate your implants, and as sensory nerves heal.  Pain medication and muscle relaxants will help you cope with any discomfort.  If you have drains, you may experience additional localized discomfort.  Consistent sharp pain should be reported to our office immediately.    Hypersensitivity of nipples or lack of sensitivity: This is normal and will gradually resolve  over time.  You may also experience a small amount of fluid or milk seeping through the nipples.  If this becomes painful or excessive notify our office immediately.    Shiny skin or any itchy feeling: Swelling can cause the breasts skin to appear shiny.  As the healing process advances, you may also find a mild to severe itchy feeling of the breasts.  An antihistamine like Benadryl can help to alleviate severe, constant itchiness.  If the skin becomes red and hot to the touch, contact our office immediately.    Asymmetry, the breasts look different, or heal differently.  Breasts may look or feel quite different from one another in the days following surgery.  This is normal.  Although no two breasts are perfectly symmetrical in nature or following surgery, breast massage and time will produce breasts that are similar in shape, position, and size.    A sloshing sound or sensation: This is not the result of your saline implant filler, but rather of air that is trapped in the implant pocket and fluid that may naturally accumulate.  This is perfectly normal and will resolve within 2-4 weeks.      CALL THE OFFICE IMMEDIATELY IF YOU EXPERIENCE ANY OF THE FOLLOWING:    ??? A high fever, (over 101??) severe nausea and vomiting, continued dizziness or incoherent behavior, such as hallucinations.    ??? Any pain that cannot be controlled by your pain medication.    ??? Bright  red skin that is hot to the touch.    ??? Excessive bleeding or fluid seeping through the incisions.    ??? A severely misshapen breast or bruising that is localized to one breast or region of the chest.  To alleviate any discomfort, and to reduce swelling, you may apply cool, not cold compresses to the treated region.  Crushed ice or ice packs must be wrapped in a towel before being applied to the skin.  Do not apply ice or anything frozen directly to the skin.  Apply cool compresses, for no longer than 20-minute intervals.     ??? Shortness of Breath    DAY OF  SURGERY INSTRUCTIONS    You will only be released to the care of a responsible adult.  All of these instructions must be clear to the adult who will monitor your health and support you, around the clock in the first 24 hours following surgery.     Rest, but not bed rest:  While rest is important in the early stages of healing, equally important is that you are ambulatory: meaning that you are walking under your own strength.  Spend 10 minutes every 2 hours engaged in light walking indoors as your recover.        Recline with your head and chest slightly elevated above your lower body.        Good nutrition: Fluids are critical following surgery.  Stick to non-carbonated, non-alcoholic, caffeine-free, and green tea-free beverages including fruit juices and water, milk, and yogurt drinks.  You must consume at least 8 ounces of fluid every 2 hours.  Stick with soft, bland, nutritious food for the first 24 hours.        Take all medication, exactly as prescribed:  If you have a pain pump, follow the instructions specifically for your pain pump.      Change your incision dressings.  Your incisions will seep fluid and some blood for a short time after surgery.  Keep dressings clean and dry.  A cotton swab with peroxide is appropriate for cleansing incisions.  Do not remove any steri-strips over your stitches.  If you have a drain placed in your incisions, carefully follow the instructions for drain care and record drained fluid on the [facility] Instructions and Log.        Wear a support bra or your surgical garment around the clock: Follow the instructions specifically and wear this garment at all times.        Begin your breast massage exactly when and as defined.  It may be very uncomfortable to do so; however this is vital to reducing the development of capsular contracture.        Do not smoke.  Smoking can greatly impair your safety prior to surgery and your ability to heal following surgery.  You must not smoke.         Relax.  Do not engage in any stressful activities.  Do not lift your hands over your head.  Do not lift anything heavier than a paperback book.  Take care of no one, and let others tend to you.         TWO TO SEVEN DAYS FOLLOWING SURGERY    ??? During this time you will progress with each day that passes.  Ease into your daily activities.  You will receive clearance to begin driving or return to work at your post-operative visit.     ??? Continue to cleanse wounds  as directed; you may shower.  Take a warm, not hot shower. Do not take a bath.  Limit your shower to 10 minutes.  Do not remove any steri-strips. Do not rub your incisions.  Apply a fragrance free moisturizer to breast and surrounding skin, however not on your incisions.    ??? Take antibiotic medications as directed. Take pain medication only as needed. You may wish to switch from prescriptive pain medication to acetaminophen or ibuprofen.    ??? Wear your bra around the clock.    ??? Do not resume any exercise other than regular walking. Walking is essential every day to prevent the formation of blood clots.    ??? Maintain a healthy diet. Do not smoke. Do not consume alcohol.      ONE to FOUR WEEKS FOLLOWING SURGERY    As you resume your normal daily activities, you must continue proper care and healing.    ??? Refrain from weight-bearing exercise, twisting or lifting anything over your head.  No tennis, golf, softball or other sports with similar swinging motions.  Avoid aerobic exercise that may cause a lot of bounce.  You may begin range of motion exercises but not with any weight, pressure, or resistance of any kind.    ??? Do not smoke. While incisions may have sealed, smoking deprives your body of necessary oxygen that can result in poorly healed, wide, raised scars.    ??? Continue to wear a proper support bra. The bra you first wore following surgery may feel somewhat loose and you may need to replace it., however no under wires for six weeks.  You may  sleep without a bra; however a camisole with built-in shelf support can be comfortable and provides added support as you continue to heal.    ??? You may sleep flat. However do not sleep on your stomach.  If you are a side sleeper, a soft pillow under your mid-back and shoulders may offer more comfort and support than a single pillow under your head.    ??? Practice good sun protection. Do not expose your breasts to direct sunlight. If you are outdoors, apply at least an SPF 30 to the chest area at least 30 minutes prior to sun exposure.  Your chest region and breast skin are highly susceptible to sunburn or the formation of irregular, darkened pigmentation.      SIX WEEKS FOLLOWING SURGERY    Healing will progress and your breasts will settle into a more final shape and position.    ??? You may ease into your regular fitness routine. However realize that your upper body may require some time to return to previous strength.    ??? You may resume wearing under wires, although these are not necessary.    ??? Discomfort or tightness and tingling will resolve.  Any lingering nipple sensitivity or lack of sensation should begin to greatly improve.     ??? No need to resume smoking. You have now gone 10 weeks (4 weeks prior to surgery and 6 weeks following) without a cigarette.  For your long-term health, there is no need to resume smoking.      YOUR FIRST YEAR    ??? Continue healthy nutrition, fitness, and sun protection.    ??? Your scars will continue to refine. If they become raised, red or thickened, or appear to widen, contact our office. Early intervention is important to achieving well-healed scars. Scars are generally refined to fine incision lines one year  after surgery.    ??? A one-year post surgery follow-up is recommended. However you may call our office at any time with your concerns or for needed follow-up.    ??? If your breasts begin to develop an unusually hard feeling, or a highly rounded ???squeezed??? appearance, call us  as soon as possible.  Early treatment is the best solution to capsular contracture.  Breast massage is the most important form of early intervention.    Remember, breast implants are not lifetime devices.    If your implants should rupture, or you suspect an implant is leaking, call our office as soon as possible.  There is no risk to your health from the saline within the implant; it will safely be absorbed and naturally expelled by your body.  However, until you are able to have the implant replaced you should perform daily breast massage to keep the implant free and loose in the implant pocket.     Your body will change with age.  The appearance of your breasts will change too.  You may wish to have your implants replaced or to undergo revision surgery again in the future to help maintain your appearance throughout life.  Contact our office with any of your questions or concerns, at any time.    In addition, you should discuss antibiotic options with our office if you plan on having any extensive dental work or any invasive procedure at any time that you have implants in your body.    [place] [facility]  POST-SURGERY INSTRUCTIONS: BREAST IMPLANT REMOVAL AND REPLACEMENT  [phone]      TYPICAL POST-OPERATIVE SYMPTOMS  Typical symptoms of breast surgery; and signs to watch for following breast implant removal and replacement with saline filled implants include the following:    Tightness in the chest region and stiffness: Tingling, burning, or intermittent shooting pain.  These are normal experiences as the skin, muscles, and tissue stretch to accommodate your implants, and as sensory nerves heal.  Pain medication and muscle relaxants will help you cope with any discomfort.  If you have drains, you may experience additional localized discomfort.  Consistent sharp pain should be reported to our office immediately.    Hypersensitivity of nipples or lack of sensitivity: This is normal and will gradually  resolve over time.  You may also experience a small amount of fluid or milk seeping through the nipples.  If this becomes painful or excessive notify our office immediately.    Shiny skin or any itchy feeling: Swelling can cause the breasts skin to appear shiny.  As the healing process advances, you may also find a mild to severe itchy feeling of the breasts.  An antihistamine like Benadryl can help to alleviate severe, constant itchiness.  If the skin becomes red and hot to the touch, contact our office immediately.    Asymmetry, the breasts look different, or heal differently.  Breasts may look or feel quite different from one another in the days following surgery.  This is normal.  Although no two breasts are perfectly symmetrical in nature or following surgery, breast massage and time will produce breasts that are similar in shape, position, and size.    A sloshing sound or sensation: This is not the result of your saline implant filler, but rather of air that is trapped in the implant pocket and fluid that may naturally accumulate.  This is perfectly normal and will resolve within 2-4 weeks.      CALL THE  OFFICE IMMEDIATELY IF YOU EXPERIENCE ANY OF THE FOLLOWING:    ??? A high fever, (over 101??) severe nausea and vomiting, continued dizziness or incoherent behavior, such as hallucinations.    ??? Any pain that cannot be controlled by your pain medication.    ??? Bright red skin that is hot to the touch.    ??? Excessive bleeding or fluid seeping through the incisions.    ??? A severely misshapen breast or bruising that is localized to one breast or region of the chest.  To alleviate any discomfort, and to reduce swelling, you may apply cool, not cold compresses to the treated region.  Crushed ice or ice packs must be wrapped in a towel before being applied to the skin.  Do not apply ice or anything frozen directly to the skin.  Apply cool compresses, for no longer than 20-minute intervals.     ??? Shortness of  Breath    DAY OF SURGERY INSTRUCTIONS    You will only be released to the care of a responsible adult.  All of these instructions must be clear to the adult who will monitor your health and support you, around the clock in the first 24 hours following surgery.     Rest, but not bed rest:  While rest is important in the early stages of healing, equally important is that you are ambulatory: meaning that you are walking under your own strength.  Spend 10 minutes every 2 hours engaged in light walking indoors as your recover.        Recline with your head and chest slightly elevated above your lower body.        Good nutrition: Fluids are critical following surgery.  Stick to non-carbonated, non-alcoholic, caffeine-free, and green tea-free beverages including fruit juices and water, milk, and yogurt drinks.  You must consume at least 8 ounces of fluid every 2 hours.  Stick with soft, bland, nutritious food for the first 24 hours.        Take all medication, exactly as prescribed:  If you have a pain pump, follow the instructions specifically for your pain pump.      Change your incision dressings.  Your incisions will seep fluid and some blood for a short time after surgery.  Keep dressings clean and dry.  A cotton swab with peroxide is appropriate for cleansing incisions.  Do not remove any steri-strips over your stitches.  If you have a drain placed in your incisions, carefully follow the instructions for drain care and record drained fluid on the [facility] Instructions and Log.        Wear a support bra or your surgical garment around the clock: Follow the instructions specifically and wear this garment at all times.        Begin your breast massage exactly when and as defined.  It may be very uncomfortable to do so; however this is vital to reducing the development of capsular contracture.        Do not smoke.  Smoking can greatly impair your safety prior to surgery and your ability to heal following surgery.  You  must not smoke.        Relax.  Do not engage in any stressful activities.  Do not lift your hands over your head.  Do not lift anything heavier than a paperback book.  Take care of no one, and let others tend to you.         TWO TO SEVEN  DAYS FOLLOWING SURGERY    ??? During this time you will progress with each day that passes.  Ease into your daily activities.  You will receive clearance to begin driving or return to work at your post-operative visit.     ??? Continue to cleanse wounds as directed; you may shower.  Take a warm, not hot shower. Do not take a bath.  Limit your shower to 10 minutes.  Do not remove any steri-strips. Do not rub your incisions.  Apply a fragrance free moisturizer to breast and surrounding skin, however not on your incisions.    ??? Take antibiotic medications as directed. Take pain medication only as needed. You may wish to switch from prescriptive pain medication to acetaminophen or ibuprofen.    ??? Wear your bra around the clock.    ??? Do not resume any exercise other than regular walking. Walking is essential every day to prevent the formation of blood clots.    ??? Maintain a healthy diet. Do not smoke. Do not consume alcohol.      ONE to FOUR WEEKS FOLLOWING SURGERY    As you resume your normal daily activities, you must continue proper care and healing.    ??? Refrain from weight-bearing exercise, twisting or lifting anything over your head.  No tennis, golf, softball or other sports with similar swinging motions.  Avoid aerobic exercise that may cause a lot of bounce.  You may begin range of motion exercises but not with any weight, pressure, or resistance of any kind.    ??? Do not smoke. While incisions may have sealed, smoking deprives your body of necessary oxygen that can result in poorly healed, wide, raised scars.    ??? Continue to wear a proper support bra. The bra you first wore following surgery may feel somewhat loose and you may need to replace it., however no under wires for six  weeks.  You may sleep without a bra; however a camisole with built-in shelf support can be comfortable and provides added support as you continue to heal.    ??? You may sleep flat. However do not sleep on your stomach.  If you are a side sleeper, a soft pillow under your mid-back and shoulders may offer more comfort and support than a single pillow under your head.    ??? Practice good sun protection. Do not expose your breasts to direct sunlight. If you are outdoors, apply at least an SPF 30 to the chest area at least 30 minutes prior to sun exposure.  Your chest region and breast skin are highly susceptible to sunburn or the formation of irregular, darkened pigmentation.      SIX WEEKS FOLLOWING SURGERY    Healing will progress and your breasts will settle into a more final shape and position.    ??? You may ease into your regular fitness routine. However realize that your upper body may require some time to return to previous strength.    ??? You may resume wearing under wires, although these are not necessary.    ??? Discomfort or tightness and tingling will resolve.  Any lingering nipple sensitivity or lack of sensation should begin to greatly improve.     ??? No need to resume smoking. You have now gone 10 weeks (4 weeks prior to surgery and 6 weeks following) without a cigarette.  For your long-term health, there is no need to resume smoking.      YOUR FIRST YEAR    ???  Continue healthy nutrition, fitness, and sun protection.    ??? Your scars will continue to refine. If they become raised, red or thickened, or appear to widen, contact our office. Early intervention is important to achieving well-healed scars. Scars are generally refined to fine incision lines one year after surgery.    ??? A one-year post surgery follow-up is recommended. However you may call our office at any time with your concerns or for needed follow-up.    ??? If your breasts begin to develop an unusually hard feeling, or a highly rounded ???squeezed???  appearance, call us as soon as possible.  Early treatment is the best solution to capsular contracture.  Breast massage is the most important form of early intervention.    Remember, breast implants are not lifetime devices.    If your implants should rupture, or you suspect an implant is leaking, call our office as soon as possible.  There is no risk to your health from the saline within the implant; it will safely be absorbed and naturally expelled by your body.  However, until you are able to have the implant replaced you should perform daily breast massage to keep the implant free and loose in the implant pocket.     Your body will change with age.  The appearance of your breasts will change too.  You may wish to have your implants replaced or to undergo revision surgery again in the future to help maintain your appearance throughout life.  Contact our office with any of your questions or concerns, at any time.    In addition, you should discuss antibiotic options with our office if you plan on having any extensive dental work or any invasive procedure at any time that you have implants in your body.    [facility]  POST-SURGERY INSTRUCTIONS: BREAST IMPLANT REMOVAL AND REPLACEMENT  [phone]      TYPICAL POST-OPERATIVE SYMPTOMS  Typical symptoms of breast surgery; and signs to watch for following breast implant removal and replacement with saline filled implants include the following:    Tightness in the chest region and stiffness: Tingling, burning, or intermittent shooting pain.  These are normal experiences as the skin, muscles, and tissue stretch to accommodate your implants, and as sensory nerves heal.  Pain medication and muscle relaxants will help you cope with any discomfort.  If you have drains, you may experience additional localized discomfort.  Consistent sharp pain should be reported to our office immediately.    Hypersensitivity of nipples or lack of sensitivity: This is normal and  will gradually resolve over time.  You may also experience a small amount of fluid or milk seeping through the nipples.  If this becomes painful or excessive notify our office immediately.    Shiny skin or any itchy feeling: Swelling can cause the breasts skin to appear shiny.  As the healing process advances, you may also find a mild to severe itchy feeling of the breasts.  An antihistamine like Benadryl can help to alleviate severe, constant itchiness.  If the skin becomes red and hot to the touch, contact our office immediately.    Asymmetry, the breasts look different, or heal differently.  Breasts may look or feel quite different from one another in the days following surgery.  This is normal.  Although no two breasts are perfectly symmetrical in nature or following surgery, breast massage and time will produce breasts that are similar in shape, position, and size.    A  sloshing sound or sensation: This is not the result of your saline implant filler, but rather of air that is trapped in the implant pocket and fluid that may naturally accumulate.  This is perfectly normal and will resolve within 2-4 weeks.      CALL THE OFFICE IMMEDIATELY IF YOU EXPERIENCE ANY OF THE FOLLOWING:    ??? A high fever, (over 101??) severe nausea and vomiting, continued dizziness or incoherent behavior, such as hallucinations.    ??? Any pain that cannot be controlled by your pain medication.    ??? Bright red skin that is hot to the touch.    ??? Excessive bleeding or fluid seeping through the incisions.    ??? A severely misshapen breast or bruising that is localized to one breast or region of the chest.  To alleviate any discomfort, and to reduce swelling, you may apply cool, not cold compresses to the treated region.  Crushed ice or ice packs must be wrapped in a towel before being applied to the skin.  Do not apply ice or anything frozen directly to the skin.  Apply cool compresses, for no longer than 20-minute intervals.     ??? Shortness  of Breath    DAY OF SURGERY INSTRUCTIONS    You will only be released to the care of a responsible adult.  All of these instructions must be clear to the adult who will monitor your health and support you, around the clock in the first 24 hours following surgery.     Rest, but not bed rest:  While rest is important in the early stages of healing, equally important is that you are ambulatory: meaning that you are walking under your own strength.  Spend 10 minutes every 2 hours engaged in light walking indoors as your recover.        Recline with your head and chest slightly elevated above your lower body.        Good nutrition: Fluids are critical following surgery.  Stick to non-carbonated, non-alcoholic, caffeine-free, and green tea-free beverages including fruit juices and water, milk, and yogurt drinks.  You must consume at least 8 ounces of fluid every 2 hours.  Stick with soft, bland, nutritious food for the first 24 hours.        Take all medication, exactly as prescribed:  If you have a pain pump, follow the instructions specifically for your pain pump.      Change your incision dressings.  Your incisions will seep fluid and some blood for a short time after surgery.  Keep dressings clean and dry.  A cotton swab with peroxide is appropriate for cleansing incisions.  Do not remove any steri-strips over your stitches.  If you have a drain placed in your incisions, carefully follow the instructions for drain care and record drained fluid on the [facility] Instructions and Log.        Wear a support bra or your surgical garment around the clock: Follow the instructions specifically and wear this garment at all times.        Begin your breast massage exactly when and as defined.  It may be very uncomfortable to do so; however this is vital to reducing the development of capsular contracture.        Do not smoke.  Smoking can greatly impair your safety prior to surgery and your ability to heal following surgery.   You must not smoke.        Relax.  Do not engage in any stressful activities.  Do not lift your hands over your head.  Do not lift anything heavier than a paperback book.  Take care of no one, and let others tend to you.         TWO TO SEVEN DAYS FOLLOWING SURGERY    ??? During this time you will progress with each day that passes.  Ease into your daily activities.  You will receive clearance to begin driving or return to work at your post-operative visit.     ??? Continue to cleanse wounds as directed; you may shower.  Take a warm, not hot shower. Do not take a bath.  Limit your shower to 10 minutes.  Do not remove any steri-strips. Do not rub your incisions.  Apply a fragrance free moisturizer to breast and surrounding skin, however not on your incisions.    ??? Take antibiotic medications as directed. Take pain medication only as needed. You may wish to switch from prescriptive pain medication to acetaminophen or ibuprofen.    ??? Wear your bra around the clock.    ??? Do not resume any exercise other than regular walking. Walking is essential every day to prevent the formation of blood clots.    ??? Maintain a healthy diet. Do not smoke. Do not consume alcohol.      ONE to FOUR WEEKS FOLLOWING SURGERY    As you resume your normal daily activities, you must continue proper care and healing.    ??? Refrain from weight-bearing exercise, twisting or lifting anything over your head.  No tennis, golf, softball or other sports with similar swinging motions.  Avoid aerobic exercise that may cause a lot of bounce.  You may begin range of motion exercises but not with any weight, pressure, or resistance of any kind.    ??? Do not smoke. While incisions may have sealed, smoking deprives your body of necessary oxygen that can result in poorly healed, wide, raised scars.    ??? Continue to wear a proper support bra. The bra you first wore following surgery may feel somewhat loose and you may need to replace it., however no under wires for six  weeks.  You may sleep without a bra; however a camisole with built-in shelf support can be comfortable and provides added support as you continue to heal.    ??? You may sleep flat. However do not sleep on your stomach.  If you are a side sleeper, a soft pillow under your mid-back and shoulders may offer more comfort and support than a single pillow under your head.    ??? Practice good sun protection. Do not expose your breasts to direct sunlight. If you are outdoors, apply at least an SPF 30 to the chest area at least 30 minutes prior to sun exposure.  Your chest region and breast skin are highly susceptible to sunburn or the formation of irregular, darkened pigmentation.      SIX WEEKS FOLLOWING SURGERY    Healing will progress and your breasts will settle into a more final shape and position.    ??? You may ease into your regular fitness routine. However realize that your upper body may require some time to return to previous strength.    ??? You may resume wearing under wires, although these are not necessary.    ??? Discomfort or tightness and tingling will resolve.  Any lingering nipple sensitivity or lack of sensation should begin to greatly improve.     ???  No need to resume smoking. You have now gone 10 weeks (4 weeks prior to surgery and 6 weeks following) without a cigarette.  For your long-term health, there is no need to resume smoking.      YOUR FIRST YEAR    ??? Continue healthy nutrition, fitness, and sun protection.    ??? Your scars will continue to refine. If they become raised, red or thickened, or appear to widen, contact our office. Early intervention is important to achieving well-healed scars. Scars are generally refined to fine incision lines one year after surgery.    ??? A one-year post surgery follow-up is recommended. However you may call our office at any time with your concerns or for needed follow-up.    ??? If your breasts begin to develop an unusually hard feeling, or a highly rounded ???squeezed???  appearance, call us as soon as possible.  Early treatment is the best solution to capsular contracture.  Breast massage is the most important form of early intervention.    Remember, breast implants are not lifetime devices.    If your implants should rupture, or you suspect an implant is leaking, call our office as soon as possible.  There is no risk to your health from the saline within the implant; it will safely be absorbed and naturally expelled by your body.  However, until you are able to have the implant replaced you should perform daily breast massage to keep the implant free and loose in the implant pocket.     Your body will change with age.  The appearance of your breasts will change too.  You may wish to have your implants replaced or to undergo revision surgery again in the future to help maintain your appearance throughout life.  Contact our office with any of your questions or concerns, at any time.    In addition, you should discuss antibiotic options with our office if you plan on having any extensive dental work or any invasive procedure at any time that you have implants in your body.      Sedation or General Anesthesia, Adult  Care After  Refer to this sheet in the next 24 hours. These instructions provide you with information on caring for yourself after your procedure. Your caregiver may also give you more specific instructions. Your treatment has been planned according to current medical practices, but problems sometimes occur. Call your caregiver if you have any problems or questions after your procedure.   HOME CARE INSTRUCTIONS   ?? Do not participate in any activities that require you to be alert or coordinated. Do not:  ?? Drive.  ?? Swim.  ?? Ride a bicycle.  ?? Operate heavy machinery.  ?? Cook.  ?? Use power tools.  ?? Climb ladders.  ?? Work at [employer].  ?? Take a bath.  ?? Do not drink alcohol.  ?? Do not make any important decisions or sign legal documents.  ?? Stay with an adult.  ?? The first  meal following your procedure should be light and small. Avoid solid foods if you feel sick to your stomach (nauseous) or if you throw up (vomit).  ?? Drink enough fluids to keep your urine clear or pale yellow.  ?? Only take your usual medicines or new medicines if your caregiver approves them.  ?? Only take over-the-counter or prescription medicines for pain, discomfort, or fever as directed by your caregiver.  ?? Keep all follow-up appointments as directed by your caregiver.  SEEK  IMMEDIATE MEDICAL CARE IF:   ?? You are not feeling normal or behaving normally after 24 hours.  ?? You have persistent nausea and vomiting.  ?? You are unable to drink fluids or eat food.  ?? You have difficulty urinating.  ?? You have difficulty breathing or speaking.  ?? You have blue or gray skin.  ?? There is difficulty waking or you cannot be woken up.  ?? You have heavy bleeding, redness, or a lot of swelling where the sedative or anesthesia entered your skin (intravenous site).  ?? You have a rash.  MAKE SURE YOU:  ?? Understand these instructions.  ?? Will watch your condition.  ?? Will get help right away if you are not doing well or get worse.  Document Released: 12/27/2004 Document Revised: 06/28/2011 Document Reviewed: 04/27/2011  ExitCare?? Patient Information ??2014 [place], [place].

## 2013-11-26 NOTE — Unmapped (Signed)
[place]  DEPARTMENT OF ANESTHESIOLOGY  PRE-PROCEDURAL EVALUATION    Sarah Huang is a 58 y.o. year old female presenting for:    Procedure(s):  EXCHANGE BILATERAL TISSUE EXPANDER FOR IMPLANT    Surgeon:   Jonita Albee, MD    Chief Complaint     <principal problem not specified>    Review of Systems     Anesthesia Evaluation    Patient summary reviewed.  All other systems reviewed and are negative.     No history of anesthetic complications         Cardiovascular:      (-) hypertension, valvular problems/murmurs, past MI, CAD, dysrhythmias, angina, CHF.    Neuro/Muscoloskeletal/Psych:    (+) neuromuscular disease (Fibromyalgia).    (-) seizures, TIA, CVA.     Pulmonary:    Sleep apnea.    (-) COPD, asthma, shortness of breath, recent URI.       GI/Hepatic/Renal:    (+) liver disease (Fatty liver).  GERD is well controlled.    (-) PUD, hepatitis, renal disease, no difficulty swallowing, no end stage liver disease.    Endo/Other:        (-) diabetes mellitus, hypothyroidism, hyperthyroidism, no anemia.       Past Medical History     Past Medical History   Diagnosis Date   ??? Hyperlipidemia    ??? Kidney stones    ??? Depression    ??? Dyslipidemia    ??? Sleep apnea 06/17/2013     not using C-PAP   ??? GERD (gastroesophageal reflux disease)    ??? Constipation 06/17/2013     IIBS   ??? Dermatitis    ??? Psoriasis    ??? Cancer 06/2013     (R) breast       Past Surgical History     Past Surgical History   Procedure Laterality Date   ??? Tonsillectomy       in the past   ??? Salpingoophorectomy  2004     Hysty/BSO-etopic pregnancy/prolapse   ??? Sinus surgery       hx sinus surg x3   ??? Mid urethral sling   2002     at time of [facility]   ??? Esophagogastroduodenoscopy N/A 05/29/2012     Procedure: EGD;  Surgeon: Langley Adie, MD;  Location: [facility] END[facility];  Service: Gastroenterology;  Laterality: N/A;   ??? Colonoscopy N/A 05/29/2012     Procedure: COLONOSCOPY W/ OR W/O BIOPSY;  Surgeon: Langley Adie, MD;  Location: [facility];  Service:  Gastroenterology;  Laterality: N/A;  limited colonoscopy.   ??? Colonoscopy N/A 06/27/2012     Procedure: COLONOSCOPY W/ OR W/O BIOPSY;  Surgeon: Langley Adie, MD;  Location: [facility];  Service: Gastroenterology;  Laterality: N/A;  colonoscopy   ??? Hysterectomy  0220     Hysty/BSO 2004-etopic pregnancy/prolapse   ??? Remove breast tissue expanders insertion implants Bilateral 06/19/2013     Procedure: Dr. Jens Som at 1:30pm-placement of bilateral tissue expanders;  Surgeon: Yolanda Manges, MD;  Location: [facility] OR;  Service: Plastics;  Laterality: Bilateral;   ??? Dissection axillary Right 06/19/2013     Procedure: -POSS RIGHT AXILLARY NODE DISSECTION;  Surgeon: Cristal Generous, MD;  Location: [facility] OR;  Service: General;  Laterality: Right;   ??? Breast surgery  06/19/13     immediate bilateral TE       Family History     Family History   Problem Relation Age of Onset   ???  Diabetes Mother    ??? Hypertension Mother    ??? Coronary artery disease Mother    ??? Heart disease Mother      Triple Bypass   ??? COPD Mother    ??? Hyperlipidemia Mother    ??? Coronary artery disease Father    ??? Hyperlipidemia Father    ??? COPD Father    ??? Heart failure Father      CHF   ??? Emphysema Father    ??? Other Sister      Blood Disorder   ??? COPD Sister    ??? Psoriasis Sister    ??? Heart attack Brother    ??? COPD Brother    ??? Heart disease Brother      1 brother -Triple Bypass   ??? Other Brother      Heart Defect, Open heart surgery   ??? Psoriasis Brother    ??? Breast cancer Other    ??? COPD Other    ??? Breast cancer Other    ??? Breast cancer Paternal Aunt    ??? Eczema Neg Hx    ??? Melanoma Neg Hx        Social History     History     Social History   ??? Marital Status: Divorced     Spouse Name: N/A     Number of Children: N/A   ??? Years of Education: N/A     Occupational History   ??? Not on file.     Social History Main Topics   ??? Smoking status: Former Smoker     Quit date: 11/11/1989   ??? Smokeless tobacco: Never Used      Comment: quit 1991   ??? Alcohol Use: No       Comment: Rare 04-02-13   ??? Drug Use: No      Comment: 04-02-13   ??? Sexual Activity: No     Other Topics Concern   ??? Caffeine Use Yes   ??? Occupational Exposure No   ??? Exercise No   ??? Seat Belt Yes     Social History Narrative       Medications     Allergies:  Allergies   Allergen Reactions   ??? Crestor [Rosuvastatin] Other (See Comments)     Causes tightening in leg muscles.       Home Meds:  Prior to Admission medications as of 11/26/13 0659   Medication Sig Taking?   acetaminophen (TYLENOL) 325 MG tablet Take 2 tablets (650 mg total) by mouth every 6 hours as needed for Pain. Yes   acyclovir (ZOVIRAX) 5 % cream Apply topically 5 times daily as needed. Yes   atorvastatin (LIPITOR) 40 MG tablet TAKE 1 TABLET BY MOUTH DAILY Yes   cetirizine (ZYRTEC) 10 MG tablet Take 10 mg by mouth daily. Yes   efinaconazole (JUBLIA) 10 % SolA Apply 1 drop topically daily. Yes   glucosamine-chondroitin 500-400 mg tablet Take 1 tablet by mouth 3 times a day. Yes   ibuprofen (ADVIL,MOTRIN) 800 MG tablet Take 1 tablet (800 mg total) by mouth every 8 hours as needed. Yes   magnesium 250 mg Tab Take by mouth. Yes   montelukast (SINGULAIR) 10 mg tablet TAKE 1 TABLET BY MOUTH AT BEDTIME Yes   omeprazole (PRILOSEC) 40 MG capsule TAKE ONE CAPSULE BY MOUTH DAILY Yes   SUMAtriptan (IMITREX) 100 MG tablet Take one tablet po daily as needed for migraine, may repeat in 2 hours if needed in 24 hours.  Yes   alendronate (FOSAMAX) 70 MG tablet TAKE 1 TABLET BY MOUTH EVERY 7 DAYS    ergocalciferol (VITAMIN D2) 50,000 unit capsule TAKE 1 CAPSULE BY MOUTH WEEKLY- VIT D #3    omega-3 fatty acids-fish oil (FISH OIL) 360-1,200 mg Cap Take 1 capsule (1,200 mg total) by mouth daily.    omega-3 fatty acids-vitamin E (FISH OIL) 1,000 mg Cap Take by mouth.    tamoxifen (NOLVADEX) 20 MG tablet Take 1 tablet (20 mg total) by mouth daily. Indications: HORMONE RECEPTOR POSITIVE BREAST CANCER        Inpatient Meds:  Scheduled:   ??? bacitracin       ???  bupivacaine-epinephrine (PF)       ??? gentamicin       ??? rifampin (RIFADIN) 600mg  irrigation  600 mg Irrigation Once     Continuous:   ??? lactated ringers 50 mL/hr (11/26/13 0722)       PRN: ceFAZolin (ANCEF) IVPB    Vital Signs     Wt Readings from Last 3 Encounters:   11/26/13 206 lb (93.441 kg)   11/22/13 206 lb (93.441 kg)   11/11/13 208 lb (94.348 kg)     Ht Readings from Last 3 Encounters:   11/26/13 5' 3.5 (1.613 m)   11/22/13 5' 3.5 (1.613 m)   11/11/13 5' 3.5 (1.613 m)     Temp Readings from Last 3 Encounters:   11/26/13 97.7 ??F (36.5 ??C) Oral   11/22/13 98 ??F (36.7 ??C) Oral   11/11/13 98.6 ??F (37 ??C)      BP Readings from Last 3 Encounters:   11/26/13 121/69   11/22/13 141/64   11/11/13 150/82     Pulse Readings from Last 3 Encounters:   11/26/13 75   11/22/13 73   11/11/13 94     SpO2 Readings from Last 3 Encounters:   11/26/13 99%   11/11/13 100%   11/01/13 96%       Physical Exam     Airway:     Mallampati: I  Mouth Opening: >2 FB  TM distance: > = 3 FB  (-) no facial hair, neck not short      Dental:            Pulmonary:   - normal exam    Breath sounds clear to auscultation.       Cardiovascular:  - normal exam   Rhythm: regular  Rate: normal    Neuro/Musculoskeletal/Psych:    Mental status: alert and oriented to person, place and time.          Abdominal:       Current OB Status:       Other Findings:        Laboratory Data     Lab Results   Component Value Date    WBC 9.9 06/12/2013    HGB 13.0 06/12/2013    HCT 38.2 06/12/2013    MCV 89.8 06/12/2013    PLT 283 06/12/2013       No results found for: [facility]    Lab Results   Component Value Date    GLUCOSE 88 06/11/2013    BUN 15 06/11/2013    CO2 24 06/11/2013    CREATININE 0.60 06/11/2013    K 4.5 06/11/2013    NA 137 06/11/2013    CL 106 06/11/2013    CALCIUM 10.1 06/11/2013    ALBUMIN 4.1 08/23/2013    PROT 6.7 08/23/2013    ALKPHOS  59 08/23/2013    ALT 16 08/23/2013    AST 18 04/13/2011    BILITOT 0.4 08/23/2013       No results found for: PTT,  INR    No results found for: PREGTESTUR, PREGSERUM, HCG, HCGQUANT    Anesthesia Plan     ASA 2     Anesthesia Type:  general LMA.     Intravenous induction.    Anesthetic plan and risks discussed with patient.    Plan, alternatives, and risks of anesthesia, including death, have been explained to and discussed with the patient/legal guardian.  By my assessment, the patient/legal guardian understands and agrees.  Scenario presented in detail.  Questions answered.      Plan discussed with CRNA.

## 2013-11-26 NOTE — Unmapped (Signed)
H&P reviewed, patient examined, no changes to H&P.

## 2013-11-26 NOTE — Unmapped (Signed)
Pt sleeping but arouse easily and denies pain. VSS.

## 2013-11-26 NOTE — Unmapped (Signed)
Pt is alert and awake and has returned to pre-op LOC. VSS. Pt denies pain at present. Support bra intact and drsg to bilat breast CDI and no bleeding noted. Pt dressed and ready for home.

## 2013-11-26 NOTE — Unmapped (Signed)
Pt to PACU per cart and siderails up. Pt slightly awake at present. VSS. Support bra intact. Drsg to bilat breast CDI.

## 2013-11-26 NOTE — Unmapped (Signed)
Pt alert and awake and denies pain. Discharge instructions given and patient and daughter state they both understand. VSS. Copy given.

## 2013-11-26 NOTE — Unmapped (Signed)
Anesthesia Post Note    Patient: Sarah Huang    Procedure(s) Performed: Procedure(s):  EXCHANGE BILATERAL TISSUE EXPANDER FOR IMPLANT    Anesthesia type: general LMA    Patient location: PACU    Post pain: Adequate analgesia    Post assessment: no apparent anesthetic complications, tolerated procedure well and no evidence of recall    Last Vitals:   Filed Vitals:    11/26/13 1120   BP: 127/49   Pulse: 69   Temp:    Resp: 14   SpO2: 100%       Post vital signs: stable    Level of consciousness: awake, alert  and oriented    Complications: None

## 2013-11-26 NOTE — Unmapped (Signed)
Anesthesia Transfer of Care Note    Patient: Sarah Huang  Procedure(s) Performed: Procedure(s):  EXCHANGE BILATERAL TISSUE EXPANDER FOR IMPLANT    Patient location: PACU    Post pain: Adequate analgesia    Post assessment: no apparent anesthetic complications, tolerated procedure well and no evidence of recall    Post vital signs:    Filed Vitals:    11/26/13 0940   BP: 154/70   Pulse: 102   Temp: 97   Resp: 14   SpO2: 94%       Level of consciousness: oriented and sedated    Complications: None

## 2013-11-26 NOTE — Unmapped (Signed)
Pt in phase 2 now and is alert and awake. Pt denies pain. VSS. Pt to BR. Daughter at chair side.

## 2013-11-26 NOTE — Unmapped (Signed)
Pt awake and denies pain now. VSS. Drsg to bilat breast CDI. Support bra intact. Pt to phase 2 now.

## 2013-11-26 NOTE — Unmapped (Signed)
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       Post-op Diagnosis: same    Procedure(s):  EXCHANGE BILATERAL TISSUE EXPANDER FOR IMPLANT, bilateral capsulorrhaphies      Surgeon(s):  Mahaila Tischer Coy Saunas, MD    Anesthesia: General    Staff:   Circulator: Orlinda Blalock, RN  Scrub Person: Albina Billet, ST  Assistant: Otis Dials  Resident: Jannette Fogo, MD    Estimated Blood Loss: 20mL                 Specimens: * No specimens in log *           Drains:   Drain 1 Breast Right (Active)   Number of days:160       Drain 2 Breast Right (Active)   Number of days:160       Drain 1 Breast Left (Active)   Number of days:160       Drain 2 Breast Left (Active)   Number of days:160             There were no complications unless listed below.    Findings: left sided seroma        Sarah Huang     Date: 11/26/2013  Time: 9:20 AM

## 2013-11-26 NOTE — Unmapped (Signed)
INTRA-OP POST BRIEFING NOTE: Sarah Huang      Specimens:     Prior to leaving the room: Nurse confirmed name of procedure, completion of instrument, sponge & needle counts, reads specimen labels aloud including patient name and addresses any equipment issues? Nurse confirmed wound class. Nurse to surgeon and anesthesia: What are key concerns for recovery and management of the patient?  YES       Blood products stored at appropriate temperatures prior to return to blood bank (if applicable)? NA      Other Comments:     Signed: Nanea Jared    Date: 11/26/2013    Time: 9:13 AM

## 2013-11-26 NOTE — Unmapped (Signed)
Pt resting quietly and states she is comfortable.

## 2013-11-26 NOTE — Unmapped (Signed)
Pain meds given per orders. VSS.

## 2013-11-26 NOTE — Unmapped (Signed)
PT AWAKE AND FOLLOWING COMMANDS.  LMA REMOVED TO 100%O2 VIA MASK.  VSS.

## 2013-11-30 NOTE — Unmapped (Signed)
[place]                              [facility]     PATIENT NAME:   Sarah Huang, Sarah Huang            MRN: [id number]  DATE OF BIRTH:  1955-11-24                     CSN: [id number]  SURGEON:        Satira Anis, M.D.            ADMIT DATE: 11/26/2013  SERVICE:        Plastic Surgery  DICTATED BY:    Satira Anis, M.D.            SURGERY DATE: 11/26/2013                                    OPERATIVE REPORT     SERVICE:  Plastic Surgery     ATTENDING PHYSICIAN: Satira Anis, M.D.     ASSISTANT(S): Jannette Fogo, M.D.     ANESTHESIA:  General.     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     PROCEDURE(S) PERFORMED:  1. Exchange of bilateral tissue expanders for silicone implants, right     implant Allergen style 20, Natrelle silicone filled breast implant 700 cc,     reference number 20-700, serial number [id number].  Left implant Allergen     style 20, Natrelle silicone filled breast implant 700 cc, reference number     20-700, serial number [id number].  2. Bilateral capsulorrhaphies.     COMPLICATIONS:  None readily evident.     SPECIMEN(S):  None.     INDICATION(S): The patient is a 58 year old woman with a previous history of  bilateral mastectomies with immediate placement of tissue expanders.  The  patient has undergone her expansion and has allowed sufficient time for  settling of the tissue.  The patient seeks to have her bilateral tissue  expanders removed and replaced with permanent implants.  The risks, benefits,  indications and alternatives were discussed with the patient including  bleeding, infection, scarring, capsular contracture, asymmetry, need for  additional  procedures, damage to adjacent structures, hematoma, seroma and  poor aesthetic outcome.  Given that the patient was previously planning on having  this surgery with Dr. Thurnell Lose of Plastic Surgery and that he has had  to take medical leave.  The patient will now be having the procedure under my  personal guidance.     DETAILS OF PROCEDURE(S):  After obtaining informed consent, the patient was  taken to the operating room where general anesthesia was induced. She was  placed in a supine position on the table with her arms secured in order that  she may be sat up during the procedure.  Once this was done, the patient was  prepped and draped in the normal sterile fashion.  A timeout was then  performed identifying the correct patient, procedure, location and any  allergies.  Once this was done, the patient's  previous incisions were  injected using 1% lidocaine with 1:100,000 epinephrine for hemostasis.  Next,  the incisions were reincised using a scalpel.  Skin flaps were raised in a  slight stair step fashion in order to prevent direct overlay of the skin  closure over the capsular closure once the procedure was complete.  The  capsule was incised using electrocautery.  Once this was done, care was taken  to remove the bilateral tissue expanders.  Once this was done, the capsules  were scored circumferentially and radially to allow for ease of fit of the  silicone implants.  The capsules were irrigated copiously using normal  saline.  Next, implant sizers of the style planned to be used, style 20, of  700 mL were placed.  Staples were used to temporarily close the incisions.  The patient was then sat up.  It was noted that the patient had significant  lateral displacement of the bilateral breast implants and it was felt that  she would benefit from bilateral capsulorrhaphies.  The patient was replaced  in a supine position and the sizers removed.  Once this was done, 2-0 Prolene  was used bilaterally in a  buried running fashion to perform lateral  capsulorrhaphies to minimize lateral displacement of the implants.  Once this  was complete, the sizers were replaced and the symmetry checked. This was  felt to be adequate and the lateral displacement of the implants  significantly improved.  Care was then taken to prepare for placement of the  implants in a sterile fashion.  The implants were identified and confirmed to  be what was selected and matching between the two.  Next, we prepared for  sterile placement of the implants.  Outer gloves were removed and the  patient's chest was wiped using a clean lap sponge dipped in the triple  antibiotic solution.  The antibiotic solution was composed of normal saline  with gentamicin, Bacitracin and Rifampin. The skin was then cleansed and the  lap removed from the field.  Sterile towels were placed around the area.  Antibiotic irrigation was then used to irrigate the pockets. The implants  were opened and covered with triple antibiotic solution as well.  Clean  gloves were placed and previously unused retractors were used to open the  pocket in order to place implants.  Care was used to place the implants  within the pocket and ensure that the identifying patch was against the chest  wall.  Once this was done, the previously placed Monocryl sutures that had  been placed in the capsule for closure were then tied.  Care was taken to  ensure that no implant was exposed once the capsule was closed bilaterally.  Once this was complete, the skin was closed in a layered fashion using 3-0  and 4-0 Monocryl. The skin was then cleansed again and Dermabond used to  cover the incisions.  The patient tolerated the procedure well and was  awakened in the operating room prior to being transferred from the OR table.  She was then taken in stable condition to recovery with plans for discharge  to home.  I was present and scrubbed for all portions of the case.  The  instrument and sponge counts  were correct prior to complete closure of the  incisions.     FINDINGS:  None.     LENGTH OF PROCEDURE:  One hour and 39 minutes.  Satira Anis, M.D.  RB/kh  D:  11/30/2013 16:46  T:  12/01/2013 19:13  Job #:  [id number]           OPERATIVE REPORT                                             PAGE    1 of   1

## 2013-11-30 NOTE — Unmapped (Signed)
Chief Complaint   Patient presents with   ??? Follow-up          History of Present Illness  Sarah Huang is a 58 y.o. female   Ref: Attending Provider Unknown  [address]  [place], [place] [address]    Dx:  Sarah Huang is a 58 y.o. female who presents due to newly diagnosed right breast cancer. She presented for screening mammography in early April where several changes were noted in both breasts. 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 The left breast lesion turned out to be a cyst which was aspirated and resolved.   She denies and breast changes, symptoms, or other concerns.   Patient is s/p Bilateral skin-sparing    total mastectomies (for R multicentric ductal carcinoma and L prophylactic),   with Right ax SN LN bx on 06/19/13.   and immediate reconstruction with bilateral tissue expanders by Dr. Jens Som       HPI Breast History:   Race: caucasian   Ethnicity: No Ashkenazi Jewish, [place], or Amish ancestry   Age of Menarche:15   LMP: December 2002   Gravida 3 Para 2 Misc 1   Age at first childbirth:21   History of breast feeding: no   History of hormonal contraceptives:yes about 4 years   Age of Menopause: Total hysterectomy at 46 with single oophorectomy. Had other ovary removed with an ectopic long ago. Essentially has no ovaries now.  History of HRT: yes, 3 months premarin   Assisted Reproduction Treatments: no   Hereditary risk factors (pre-breast biopsy): no   Prior history of breast biopsy: 05-10-13   Family history of breast or ovarian cancer: yes, paternal aunt diagnosed with breast cancer age of diagnosis unknown, paternal cousin diagnosed with breast cancer at age 15   Family history of other cancers: yes, maternal aunt diagnosed with colon cancer at age 2, maternal aunt diagnosed with pancreatic cancer at age 73   Patient interested in future childbearing:no   Patient interested in meeting with a  fertility specialist: no   Comments:     has had many sinus surgeryies x3   Has microscopic hematuria chronic  No other bleeding issues.   Short period of HRT few months only.     Interval history:   Presents for cancer survivor ship. She reports she stated Tamoxifen on 10/04/13, but stopped on 11/17/13 due to worsening fatigue and generalized pain. She tried Tylenol and Motrin for pain, no help. She is tearful today when she was talking about the pain. She verbalized she had chronic depression. She states she has sufficient supports from family and friend. Refused support from SW.     Breast cancer stage: IA  Understands adjuvant hormonal blockade will decrease risk even further. Understands side effects f tam.   Has shooting pain with TE. overall mood depressed, gained weight.           Review of Systems   Constitutional: Positive for weight gain, activity change and appetite change. Negative for fever, chills, diaphoresis and fatigue.   HENT: Positive for congestion, postnasal drip and rhinorrhea.    Eyes: Negative for photophobia and visual disturbance.   Respiratory: Negative for cough, chest tightness and shortness of breath.    Cardiovascular: Negative for chest pain, palpitations and leg swelling.   Gastrointestinal: Negative for nausea, vomiting, constipation, blood in stool and abdominal distention.   Genitourinary: Negative for dysuria,  hematuria, difficulty urinating and menstrual problem.   Musculoskeletal: Positive for myalgias, back pain, arthralgias and gait problem. Negative for joint swelling.   Skin: Negative for color change, pallor and rash.   Neurological: Positive for headaches (sinus). Negative for dizziness, weakness, light-headedness and numbness.   Hematological: Negative for adenopathy. Does not bruise/bleed easily.   Psychiatric/Behavioral: Positive for depression and dysphoric mood (teraful. h/o depression in past, rx with psychotherapy). Negative for suicidal ideas. The patient is not  nervous/anxious.    All other systems reviewed and are negative.      Vitals  Blood pressure 141/64, pulse 73, temperature 98 ??F (36.7 ??C), temperature source Oral, resp. rate 17, height 5' 3.5 (1.613 m), weight 206 lb (93.441 kg).    Physical Exam   Nursing note and vitals reviewed.  Constitutional: She is oriented to person, place, and time. She appears well-developed and well-nourished. No distress.   obese   HENT:   Head: Normocephalic and atraumatic.   Eyes: Conjunctivae and EOM are normal.   Neck: Normal range of motion. Neck supple.   Cardiovascular: Normal rate and regular rhythm.    No murmur heard.  Pulmonary/Chest: Effort normal and breath sounds normal. No respiratory distress. She has no wheezes.   S/p bil skin sparing mastectomies with immediate reconstruction with tissue expanders,     Abdominal: She exhibits no distension and no mass. There is no tenderness. There is no rebound and no guarding.   Musculoskeletal: Normal range of motion. She exhibits no edema or tenderness.   Lymphadenopathy:        Head (right side): No submandibular adenopathy present.        Head (left side): No submandibular adenopathy present.     She has no cervical adenopathy.     She has no axillary adenopathy.   Neurological: She is alert and oriented to person, place, and time. Coordination normal.   Skin: Skin is warm and dry. No rash noted. She is not diaphoretic. No cyanosis or erythema. No pallor. Nails show no clubbing.   Psychiatric: She has a normal mood and affect. Her behavior is normal. Judgment and thought content normal.   Tearful, worried about cancer coming back.         Neurologic Exam     Mental Status   Oriented to person, place, and time.     Cranial Nerves     CN III, IV, VI   Extraocular motions are normal.       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   1. 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 S/p bil mastectomies with tissue expanders.  2. Dysphoric mood, ? Depression, frequent  headaches    We discussed the following:  1. Role of ER/PR in cancer progression and need for adjuvant hormonal blockade-         discussed arimidex for 5 yrs. Roughly chance of overall recurrence is around 15% and    tamoxifen will reduce recurrence by 50%. S/p TAH/BSO, therefore no risk of endometrial cancer with tamoxifen. H/o osteoporosis, would avoid anastrazole.   2. No more  Mammograms   3. Discussed oncotype score of 12 with approximately 8% chance of recurrence in those who took tamoxifen.   4. Given bilateral mastectomies, 6mm tumor, would now recommend tamoxifen for 5 yrs.  5. Went over risk factors for breast cancer and risk reduction including not smoking, avoiding execcsive ETOH use, role of Aspirin, exercise, maintaining ideal BMI  and avoiding any hormonal supplements.   6. Genetic testing neg for brca .        PLAN:  Suggested stop Tamoxifen for 4 weeks. rechangledge it to see if help generalized pain and fatigue. Arimidex is not recommended duo to osteoprosis. Strongly recommended to see psych for depression.    We had an extensive discussion regarding survivorship. We reviewed her Oncology Treatment Summary and Survivorship Care Plan in  great detail (see separate documents dated 11/22/2013). We discussed signs/symptoms of recurrence. She had many thoughtful comments and questions and has a good understanding of plan.     I spent a total of 60 minutes in today's visit; at least 50 minutes were in patient education and coordination of care.    Return to clinic in 4 weeks after restarted Tamoxfen, around in the mid of Jan. Please call any questions and concerns.                            Histories   She has a past medical history of Hyperlipidemia; Kidney stones; Depression; Dyslipidemia; Sleep apnea (06/17/2013); GERD (gastroesophageal reflux disease); Constipation (06/17/2013); Dermatitis; Psoriasis; and Cancer (06/2013).    She has past surgical history that includes Tonsillectomy; Salpingoophorectomy (2004); Sinus surgery; mid urethral sling  (2002); Esophagogastroduodenoscopy (N/A, 05/29/2012); Colonoscopy (N/A, 05/29/2012); Colonoscopy (N/A, 06/27/2012); Hysterectomy (0220); remove breast tissue expanders insertion implants (Bilateral, 06/19/2013); dissection axillary (Right, 06/19/2013); Breast surgery (06/19/13); and remove breast tissue expanders insertion implants (Bilateral, 11/26/2013).    Her family history includes Breast cancer in her other, other, and paternal aunt; COPD in her brother, father, mother, other, and sister; Coronary artery disease in her father and mother; Diabetes in her mother; Emphysema in her father; Heart attack in her brother; Heart disease in her brother and mother; Heart failure in her father; Hyperlipidemia in her father and mother; Hypertension in her mother; Other in her brother and sister; Psoriasis in her brother and sister. There is no history of Eczema or Melanoma.    She reports that she quit smoking about 24 years ago. She has never used smokeless tobacco. She reports that she does not drink alcohol or use illicit drugs.    Allergies  Crestor    Medications  Current Outpatient Prescriptions   Medication Sig   ???  acetaminophen Take 2 tablets (650 mg total) by mouth every 6 hours as needed for Pain.   ??? acyclovir Apply topically 5 times daily as needed.   ??? alendronate TAKE 1 TABLET BY MOUTH EVERY 7 DAYS   ??? atorvastatin TAKE 1 TABLET BY MOUTH DAILY   ??? cetirizine Take 10 mg by mouth daily.   ???  ergocalciferol TAKE 1 CAPSULE BY MOUTH WEEKLY- VIT D #3   ??? glucosamine-chondroitin Take 1 tablet by mouth 3 times a day.   ??? ibuprofen Take 1 tablet (800 mg total) by mouth every 8 hours as needed.   ??? magnesium Take by mouth.   ??? montelukast TAKE 1 TABLET BY MOUTH AT BEDTIME   ??? omega-3 fatty acids-vitamin E Take by mouth.   ??? omeprazole TAKE ONE CAPSULE BY MOUTH DAILY   ??? SUMAtriptan Take one tablet po daily as needed for migraine, may repeat in 2 hours if needed in 24 hours.   ??? tamoxifen Take 1 tablet (20 mg total) by mouth daily. Indications: HORMONE RECEPTOR POSITIVE BREAST CANCER   ??? cephALEXin Take 1 capsule (500 mg total) by mouth 4 times daily before meals and at bedtime for 7 days.   ??? efinaconazole Apply 1 drop topically daily.   ??? ibuprofen Take 1 tablet (800 mg total) by mouth every 8 hours as needed.   ??? omega-3 fatty acids-fish oil Take 1 capsule (1,200 mg total) by mouth daily.     No current facility-administered medications for this visit.         The following portions of the patient's history were reviewed and updated as appropriate: allergies, current medications, past family history, past medical history, past social history, past surgical history and problem list.    Review of Lab Results  Lab Results   Component Value Date    WBC 9.9 06/12/2013    HGB 13.0 06/12/2013    HCT 38.2 06/12/2013    PLT 283 06/12/2013    CREATININE 0.60 06/11/2013    BUN 15 06/11/2013    PROT 6.7 08/23/2013    AST 18 04/13/2011    ALT 16 08/23/2013    BILITOT 0.4 08/23/2013    CALCIUM 10.1 06/11/2013    MG 2.0 04/02/2013     Lab Results   Component Value Date    LYMPHSABS 2301 03/27/2012    ALKPHOS 59 08/23/2013     No results found  for: PROTIME  Lab Results   Component Value Date    TSH 1.36 04/02/2013     No results found for: SPEP     Imaging   Nm Injection Sentinel Node Wo Imaging    06/21/2013   The patient has right breast cancer and is referred for sentinel lymph node mapping prior to surgery. The patient received 0.55 millicuries of Tc-18m Lymphoseek intradermally. No imaging was performed at the request of Dr. Lucille Passy.     Report Verified by: Lovey Newcomer, M.D. at 06/21/2013 5:31 PM      Investigations Reviewed:   Mammogram:      Pathology:     CASE: [id number]  PATIENT: Sarah Huang    Clinical History: Bilateral total mastectomy, right axillary sentinel  lymph node biopsy, possible right axillary node dissection  Pre-Operative Diagnosis: breast cancer  Post-Operative Diagnosis: same  Specimen(s) Submitted: A. left breast, short stitch superior, long stitch  lateral; B. right breast, short stitch superior, long stitch lateral; C.  Right axillary sentinellymph node #1  CPT Code(s): 88307 X 3; 88331 X 1; 88332 X 2; 88341 X 1; 16109 X 1  Additional Information:    FINAL DIAGNOSIS:    A. Left breast, mastectomy:  - Fibrocystic change with focal atypical ductal hyperplasia (ADH).  - No evidence of in situ or invasive carcinoma.  - Unremarkable nipple.  - Resection margins are free of ADH.  B. Right breast, mastectomy:  - Well differentiated (grade 1) invasive ductal carcinoma.  - No ductal carcinoma in situ (DCIS) identified.  - Margins are free of DCIS and invasive carcinoma.  - Two biopsy sites identified.  - Background proliferative fibrocystic changes.  - Unremarkable nipple.  - See synoptic report.  - Pathologic staging: pT1a, pN0(sn), pMX.    C. Right axillary sentinel lymph node #1, excision:  - One lymph node is negative for metastatic carcinoma (0/1) by HE and  immunostain.    SYNOPTIC REPORT  Procedure:  Total mastectomy (including nipple and skin).  Lymph Node Sampling:  Sentinel lymph node(s).  Specimen  Laterality:  Right.  Histologic Type of Invasive Carcinoma:  Invasive ductal carcinoma (no special type or not otherwise specified).  Tumor Size: Size of Largest Invasive Carcinoma:  Greatest dimension of largest focus of invasion >1 mm: 3 mm.  Histologic Grade: Nottingham Histologic Score:  Glandular (Acinar)/Tubular Differentiation:  Score 1: >75% of tumor area forming glandular/tubular structures.  Nuclear Pleomorphism:  Score 1: Nuclei small with little increase in size in comparison with  normal breast epithelial cells, regular outlines, uniform nuclear  chromatin, little variation in size.  Mitotic Rate:  Score 1 (less than or equal to 3 mitoses per mm2).  Overall Grade:  Grade 1: scores of 3, 4, or 5.  Tumor Focality:  Single focus of invasive carcinoma.  Ductal Carcinoma In Situ (DCIS):  No DCIS is present.  Margins (select all that apply):  Invasive Carcinoma:  Margins uninvolved by invasive carcinoma.  Distance from closest margin: 4 cm.  Specify margin: Deep and lateral.  Lymph Nodes:  Total number of lymph nodes examined (sentinel and nonsentinel): 1.  Number of sentinel lymph nodes examined: 1.  Lymph Node Involvement:  Number of lymph nodes with macrometastases (>2 mm): 0.  Number of lymph nodes with micrometastases (>0.2 mm to 2 mm and/or  >200cells): 0.  Number of lymph nodes with isolated tumor cells (less than or equal to 0.2  mm and less than or equal to 200 cells): 0.  Pathologic Staging (pTNM):  Primary Tumor (Invasive Carcinoma) (pT):  pT1a: Tumor >1 mm but less than or equal to 5 mm in greatest  dimension.  Regional Lymph Nodes (pN):  Modifier:  (sn): Only sentinel node(s) evaluated.  Category (pN):  pN0: No regional lymph node metastasis identified histologically.  Distant Metastasis (pM):  Not applicable.  Ancillary Studies:  Performed on biopsy specimen ([id number], 05/10/2013): ER positive, PR  positive, HER2 negative (1+), MIB1 proliferation score low (15%).    Alveria Apley, M.D.  (Resident)/sar     Medical Decision Making:  The following items were considered in medical decision making:  Review / order clinical lab tests  Review / order radiology tests  Review / order other diagnostic tests/interventions  Reviewed outside records  Medication toxicity monitoring performed

## 2013-12-03 ENCOUNTER — Ambulatory Visit: Admit: 2013-12-03 | Payer: PRIVATE HEALTH INSURANCE

## 2013-12-03 DIAGNOSIS — Z9013 Acquired absence of bilateral breasts and nipples: Secondary | ICD-10-CM

## 2013-12-03 NOTE — Unmapped (Signed)
Post op visit  58 yo w s/p exchange of bilateral tissue expanders for silicone implants. She reports some soreness bilaterally, but her pain is being controlled reasonably with her ibuprofen.  She is concerned about excess tissue along the lateral aspect of each breast and on to the chest wall.  She is also concerned about an area along the lateral aspect of the right breast with a significant depression crossing her incision.  She denies any s/s of infection.    Procedure: Exchange of bilateral tissue expanders for silicone implants    Date: 11/26/13    Complaints: soreness      Findings:  Well-healing incisions bilaterally, +signficant edema of the skin, rt > lt, no erythema or appreciable fluid collections      Assessment:  58 yo w s/p removal of bilat tissue expanders, placement of silicone breast implants and bilateral, lateral capsulorrhaphies doing well post-op.    Plan:  1. Patient doing well overall.  Discussed the possibility of excising some of her excess lateral tissue in the future, some of which may be done in the procedure room.  The patient is early in the healing process and has significant edema which is affecting her results at this time.  Will allow additional time for healing, and improvement of appearance of breasts prior to considering additional intervention.  2. Briefly discussed nipple reconstruction with patient.  3. Will extend patient's light duty for an additional period of time.  Will have pt RTC in 3 weeks at [facility].  Will reassess need for light duty at that time.

## 2013-12-09 ENCOUNTER — Ambulatory Visit: Admit: 2013-12-09 | Discharge: 2013-12-09 | Payer: PRIVATE HEALTH INSURANCE

## 2013-12-09 ENCOUNTER — Inpatient Hospital Stay: Admit: 2013-12-09 | Payer: PRIVATE HEALTH INSURANCE

## 2013-12-09 DIAGNOSIS — M545 Low back pain, unspecified: Secondary | ICD-10-CM

## 2013-12-09 NOTE — Unmapped (Signed)
This office note has been dictated.

## 2013-12-09 NOTE — Unmapped (Signed)
[facility] AND SPORTS MEDICINE     PATIENT NAME: Sarah Huang, Sarah Huang              MRN: [id number]  DATE OF BIRTH: 1955/02/15                      CSN: [id number]  PROVIDER: Cephus Shelling, M.D.                 VISIT DATE: 12/09/2013                                NEW PATIENT OFFICE VISIT     Ms. Kell is a pleasant 58 year old female who is here for evaluation of her  low back pain.  She denies any leg symptoms.  She had a mastectomy in the  summer and since then her back pain has gotten worse. This was done for  breast cancer.  She denies any lower extremity radicular symptoms.  Some pain  in her right greater trochanter but nothing significant.  Most of her pain is  in her low back.  No injuries that she recalls.  Working and standing makes  the pain worse.  Rest and ibuprofen makes it a little bit better.  She is an  Charity fundraiser at [employer].     Past medical history is significant for osteoporosis, breast cancer,  hypercholesterolemia, bilateral mastectomy.     A 10 point review of systems is negative for bowel or bladder dysfunction.  Cardiac, pulmonary or renal conditions.     PHYSICAL EXAMINATION:  She is a pleasant female in no acute distress.  She  has good flexion of her lumbar spine.  Extension is more painful.  Neurologically she is stable.  Negative straight leg raise.  Strength exam is  normal.  No upper motor neuron signs.  Sensation is intact throughout.     Radiographs of her lumbar spine are unremarkable.     ASSESSMENT AND PLAN:  She has a lumbar strain. I am sending her for physical  therapy.  Hopefully this will relieve her symptoms. If not improvement she  will return for follow-up.                                                 Cephus Shelling, M.D.  SA/tmg  D:  12/09/2013 17:09  T:  12/11/2013 08:38  Job #:  [id number]           NEW PATIENT OFFICE VISIT                                     PAGE    1 of   1

## 2013-12-27 ENCOUNTER — Ambulatory Visit: Admit: 2013-12-27 | Payer: PRIVATE HEALTH INSURANCE

## 2013-12-27 DIAGNOSIS — Z9013 Acquired absence of bilateral breasts and nipples: Secondary | ICD-10-CM

## 2013-12-27 NOTE — Unmapped (Signed)
Post op visit  58 yo w 1 month s/p exchange of tissue expanders for bilateral breast implants.  She reports doing well since her last visit without s/s of infection.  She does report having a small piece of suture extruding through her incision, which she removed.  She reports being ready to return to work on the 30th, although she noted significant tightness and soreness in her chest/pec muscles.    Procedure:  Exchange bilateral TE for permanent implants    Date:  11/26/13    Complaints:  Some tenderness      Findings: well-healing incisions w/o open areas, erythema or drainage; edema improving, ecchymoses resolved; +excess skin along lateral breasts bilaterally      Assessment:  58 yo w s/p exchange of expanders for bilateral implants doing well post-operatively.    Plan:  1. Patient encouraged to begin stretching of the arms and chest to improve feeling of tightness.  2. Pt given a note to return to work on light duty until 01/13/13, then she may resume full duty.  3. Will have pt RTC in 2-3 months or sooner if problems arise.

## 2014-01-07 MED ORDER — ZOVIRAX 5 % cream
5 | TOPICAL | Status: AC
Start: 2014-01-07 — End: 2015-04-14

## 2014-01-07 NOTE — Unmapped (Signed)
Last refill 12-04-2012  Last oV 11-19-13  No appt sch.

## 2014-01-20 MED ORDER — alendronate (FOSAMAX) 70 MG tablet
70 | ORAL_TABLET | ORAL | 1.00 refills | 90.00000 days | Status: AC
Start: 2014-01-20 — End: 2015-02-02

## 2014-01-20 MED ORDER — omeprazole (PRILOSEC) 40 MG capsule
40 | ORAL_CAPSULE | Freq: Every day | ORAL | Status: AC
Start: 2014-01-20 — End: 2014-12-01

## 2014-01-20 MED ORDER — ibuprofen (ADVIL,MOTRIN) 800 MG tablet
800 | ORAL_TABLET | Freq: Three times a day (TID) | ORAL | Status: AC | PRN
Start: 2014-01-20 — End: 2014-04-28

## 2014-01-20 MED ORDER — atorvastatin (LIPITOR) 40 MG tablet
40 | ORAL_TABLET | Freq: Every day | ORAL | Status: AC
Start: 2014-01-20 — End: 2014-04-08

## 2014-01-20 MED ORDER — montelukast (SINGULAIR) 10 mg tablet
10 | ORAL_TABLET | Freq: Every evening | ORAL | Status: AC
Start: 2014-01-20 — End: 2015-04-14

## 2014-01-20 MED ORDER — ergocalciferol (VITAMIN D2) 50,000 unit capsule
1250 | ORAL_CAPSULE | ORAL | Status: AC
Start: 2014-01-20 — End: 2014-10-13

## 2014-01-20 NOTE — Unmapped (Signed)
Pt switching to mail order phar [facility]  Last OV 05-03-13  Last OV phy 04-02-13, including Vit D

## 2014-02-04 MED ORDER — tamoxifen (NOLVADEX) 20 MG tablet
20 | ORAL_TABLET | Freq: Every day | ORAL | 1.00 refills | 30.00000 days | Status: AC
Start: 2014-02-04 — End: 2014-02-14

## 2014-02-04 NOTE — Unmapped (Signed)
Called patient, spoke to patient, schedule 1 wk follow up with Dr. Graylon Good patient verbalized understanding

## 2014-02-14 ENCOUNTER — Ambulatory Visit: Admit: 2014-02-17 | Payer: PRIVATE HEALTH INSURANCE

## 2014-02-14 ENCOUNTER — Ambulatory Visit: Admit: 2014-02-14 | Payer: PRIVATE HEALTH INSURANCE

## 2014-02-14 DIAGNOSIS — C50919 Malignant neoplasm of unspecified site of unspecified female breast: Secondary | ICD-10-CM

## 2014-02-14 DIAGNOSIS — C50911 Malignant neoplasm of unspecified site of right female breast: Secondary | ICD-10-CM

## 2014-02-14 LAB — CBC
Hematocrit: 35.9 % (ref 35.0–45.0)
Hemoglobin: 12.1 g/dL (ref 11.7–15.5)
MCH: 29.5 pg (ref 27.0–33.0)
MCHC: 33.7 g/dL (ref 32.0–36.0)
MCV: 87.7 fL (ref 80.0–100.0)
MPV: 7 fL (ref 7.5–11.5)
Platelets: 235 10*3/uL (ref 140–400)
RBC: 4.09 10*6/uL (ref 3.80–5.10)
RDW: 13.8 % (ref 11.0–15.0)
WBC: 5.9 10*3/uL (ref 3.8–10.8)

## 2014-02-14 LAB — COMPREHENSIVE METABOLIC PANEL, SERUM
ALT: 11 U/L (ref 7–52)
AST (SGOT): 14 U/L (ref 13–39)
Albumin: 3.9 g/dL (ref 3.5–5.7)
Alkaline Phosphatase: 44 U/L (ref 36–125)
Anion Gap: 3 mmol/L (ref 3–16)
BUN: 18 mg/dL (ref 7–25)
CO2: 27 mmol/L (ref 21–33)
Calcium: 9.2 mg/dL (ref 8.6–10.3)
Chloride: 106 mmol/L (ref 98–110)
Creatinine: 0.65 mg/dL (ref 0.60–1.30)
GFR MDRD Af Amer: 114 See note.
GFR MDRD Non Af Amer: 94 See note.
Glucose: 99 mg/dL (ref 70–100)
Osmolality, Calculated: 284 mOsm/kg (ref 278–305)
Potassium: 4.3 mmol/L (ref 3.5–5.3)
Sodium: 136 mmol/L (ref 133–146)
Total Bilirubin: 0.4 mg/dL (ref 0.0–1.5)
Total Protein: 6.6 g/dL (ref 6.4–8.9)

## 2014-02-14 LAB — DIFFERENTIAL
Basophils Absolute: 30 /uL (ref 0–200)
Basophils Relative: 0.5 % (ref 0.0–1.0)
Eosinophils Absolute: 130 /uL (ref 15–500)
Eosinophils Relative: 2.2 % (ref 0.0–8.0)
Lymphocytes Absolute: 2584 /uL (ref 850–3900)
Lymphocytes Relative: 43.8 % (ref 15.0–45.0)
Monocytes Absolute: 454 /uL (ref 200–950)
Monocytes Relative: 7.7 % (ref 0.0–12.0)
Neutrophils Absolute: 2702 /uL (ref 1500–7800)
Neutrophils Relative: 45.8 % (ref 40.0–80.0)

## 2014-02-14 LAB — VITAMIN D 25 HYDROXY: Vit D, 25-Hydroxy: 34.2 ng/mL (ref 30.0–100)

## 2014-02-14 MED ORDER — tamoxifen (NOLVADEX) 20 MG tablet
20 | ORAL_TABLET | Freq: Every day | ORAL | 1.00 refills | 30.00000 days | Status: AC
Start: 2014-02-14 — End: 2014-10-30

## 2014-02-14 NOTE — Unmapped (Addendum)
Continue tamoxifen.   New script sent to mail and local pharmacies. Was doing well on tamoxifen after restarting, now off x 3 wks because mail order pharmacy says they did not get script.   Tolerating well, has hot flashes, increased appetite, weight stable.   No vaginal bleeding. S/p TAH.   Off fosamax in preparation for tooth extraction. PCP monitoring osteoporosis.   Back in 4-6 months    Went over SE of tamoxifen ( DVT, Cataracts,  ) and signs of same.

## 2014-02-14 NOTE — Unmapped (Signed)
Chief Complaint   Patient presents with   ??? Follow-up          History of Present Illness  Sarah Huang is a 59 y.o. female   Ref: Attending Provider Unknown  [address]  [place], [place] [address]    Dx:  Sarah Huang is a 59 y.o. female who presents due to newly diagnosed right breast cancer. She presented for screening mammography in early April where several changes were noted in both breasts. 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 The left breast lesion turned out to be a cyst which was aspirated and resolved.   She denies and breast changes, symptoms, or other concerns.   Patient is s/p Bilateral skin-sparing    total mastectomies (for R multicentric ductal carcinoma and L prophylactic),   with Right ax SN LN bx on 06/19/13.   and immediate reconstruction with bilateral tissue expanders by Dr. Jens Som       HPI Breast History:   Race: caucasian   Ethnicity: No Ashkenazi Jewish, [place], or Amish ancestry   Age of Menarche:15   LMP: December 2002   Gravida 3 Para 2 Misc 1   Age at first childbirth:21   History of breast feeding: no   History of hormonal contraceptives:yes about 4 years   Age of Menopause: Total hysterectomy at 46 with single oophorectomy. Had other ovary removed with an ectopic long ago. Essentially has no ovaries now.  History of HRT: yes, 3 months premarin   Assisted Reproduction Treatments: no   Hereditary risk factors (pre-breast biopsy): no   Prior history of breast biopsy: 05-10-13   Family history of breast or ovarian cancer: yes, paternal aunt diagnosed with breast cancer age of diagnosis unknown, paternal cousin diagnosed with breast cancer at age 71   Family history of other cancers: yes, maternal aunt diagnosed with colon cancer at age 27, maternal aunt diagnosed with pancreatic cancer at age 51   Patient interested in future childbearing:no   Patient interested in meeting with a  fertility specialist: no   Comments:     has had many sinus surgeryies x3   Has microscopic hematuria chronic  No other bleeding issues.   Short period of HRT few months only.     Interval history:     She reports she started Tamoxifen on 10/04/13, but stopped on 11/17/13 due to worsening fatigue and generalized pain.     Suggested to stop Tamoxifen for 4 weeks. And then rechallenge.  Did well with rechallenge, However did not get refill from [place], hence has had a break.  Feels better.  . Arimidex is not recommended duo to osteoprosis. Strongly recommended to see psych for depression.   Breast cancer stage: IA             Review of Systems   Constitutional: Positive for weight gain, activity change and appetite change. Negative for fever, chills, diaphoresis and fatigue.   HENT: Positive for congestion, postnasal drip and rhinorrhea.    Eyes: Negative for photophobia and visual disturbance.   Respiratory: Negative for cough, chest tightness and shortness of breath.    Cardiovascular: Negative for chest pain, palpitations and leg swelling.   Gastrointestinal: Negative for nausea, vomiting, constipation, blood in stool and abdominal distention.   Genitourinary: Negative for dysuria, hematuria, difficulty urinating and menstrual problem.   Musculoskeletal: Positive for myalgias, back pain, arthralgias and gait problem. Negative for  joint swelling.   Skin: Negative for color change, pallor and rash.   Neurological: Positive for headaches (sinus). Negative for dizziness, weakness, light-headedness and numbness.   Hematological: Negative for adenopathy. Does not bruise/bleed easily.   Psychiatric/Behavioral: Positive for depression and dysphoric mood (teraful. h/o depression in past, rx with psychotherapy). Negative for suicidal ideas. The patient is not nervous/anxious.    All other systems reviewed and are negative.      Vitals  Blood pressure 144/71, pulse 58, temperature 96.8 ??F (36 ??C), temperature source Oral, resp.  rate 17, height 5' 3.75 (1.619 m), weight 205 lb (92.987 kg).    Physical Exam   Nursing note and vitals reviewed.  Constitutional: She is oriented to person, place, and time. She appears well-developed and well-nourished. No distress.   obese   HENT:   Head: Normocephalic and atraumatic.   Eyes: Conjunctivae and EOM are normal.   Neck: Normal range of motion. Neck supple.   Cardiovascular: Normal rate and regular rhythm.    No murmur heard.  Pulmonary/Chest: Effort normal and breath sounds normal. No respiratory distress. She has no wheezes.   S/p bil skin sparing mastectomies with immediate reconstruction with tissue expanders,     Abdominal: She exhibits no distension and no mass. There is no tenderness. There is no rebound and no guarding.   Musculoskeletal: Normal range of motion. She exhibits no edema or tenderness.   Lymphadenopathy:        Head (right side): No submandibular adenopathy present.        Head (left side): No submandibular adenopathy present.     She has no cervical adenopathy.     She has no axillary adenopathy.   Neurological: She is alert and oriented to person, place, and time. Coordination normal.   Skin: Skin is warm and dry. No rash noted. She is not diaphoretic. No cyanosis or erythema. No pallor. Nails show no clubbing.   Psychiatric: She has a normal mood and affect. Her behavior is normal. Judgment and thought content normal.    Happier today         Neurologic Exam     Mental Status   Oriented to person, place, and time.     Cranial Nerves     CN III, IV, VI   Extraocular motions are normal.       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   1. 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 S/p bil mastectomies with tissue expanders.oncotype score of 12 with approximately 8% chance of recurrence in those who took tamoxifen.   2. Dysphoric mood, ? Depression, frequent headaches  1. brca neg.   2.   recommend tamoxifen for 5 yrs.         PLAN:  Continue tamoxifen.   New script sent to mail and local pharmacies. Was doing well on tamoxifen after restarting, now off x 3 wks because mail order pharmacy says they did not get script.   Tolerating well, has hot flashes, increased appetite, weight stable.   No vaginal bleeding. S/p TAH.   Off fosamax in preparation for tooth extraction. PCP monitoring osteoporosis.   Back in 4-6 months    Went over SE of tamoxifen ( DVT, Cataracts,  ) and signs of same.           Tamel Abel                             Histories   She has a past medical history of Hyperlipidemia; Kidney stones; Depression; Dyslipidemia; Sleep apnea (06/17/2013); GERD (gastroesophageal reflux disease); Constipation (06/17/2013); Dermatitis; Psoriasis; and Cancer (06/2013).    She has past surgical history that includes Tonsillectomy; Salpingoophorectomy (2004); Sinus surgery; mid urethral sling  (2002); Esophagogastroduodenoscopy (N/A, 05/29/2012); Colonoscopy (N/A, 05/29/2012); Colonoscopy (N/A, 06/27/2012); Hysterectomy (0220); remove breast tissue expanders insertion implants (Bilateral, 06/19/2013); dissection axillary (Right, 06/19/2013); Breast surgery (06/19/13); and remove  breast tissue expanders insertion implants (Bilateral, 11/26/2013).    Her family history includes Breast cancer in her other, other, and paternal aunt; COPD in her brother, father, mother, other, and sister; Coronary artery disease in her father and mother; Diabetes in her mother; Emphysema in her father; Heart attack in her brother; Heart disease in her brother and mother; Heart failure in her father; Hyperlipidemia in her father and mother; Hypertension in her mother; Other in her brother and sister; Psoriasis in her brother and sister. There is no history of Eczema or Melanoma.    She reports that she quit smoking about 24 years ago. She has never used smokeless tobacco. She reports that she does not drink alcohol or use illicit drugs.    Allergies  Crestor    Medications  Current Outpatient Prescriptions   Medication Sig   ??? acetaminophen Take 2 tablets (650 mg total) by mouth every 6 hours as needed for Pain.   ??? alendronate Take 1 tablet (70 mg total) by mouth every 7 days.   ??? atorvastatin Take 1 tablet (40 mg total) by mouth daily.   ??? cetirizine Take 10 mg by mouth daily.   ??? ergocalciferol Take 1 capsule (50,000 Units total) by mouth once a week.   ??? glucosamine-chondroitin Take 1 tablet by mouth 3 times a day.   ??? ibuprofen Take 1 tablet (800 mg total) by mouth every 8 hours as needed.   ??? magnesium Take by mouth.   ??? montelukast Take 1 tablet (10 mg total) by mouth at bedtime.   ??? omeprazole Take 1 capsule (40 mg total) by mouth daily.   ??? SUMAtriptan Take one tablet po daily as needed for migraine, may repeat in 2 hours if needed in 24 hours.   ??? ZOVIRAX APPLY TOPICALLY 5 TIMES DAILY AS NEEDED   ??? efinaconazole Apply 1 drop topically daily.   ??? omega-3 fatty acids-fish oil Take 1 capsule (1,200 mg total) by mouth daily.   ??? omega-3 fatty acids-vitamin E Take by mouth.   ??? tamoxifen Take 1 tablet (20 mg total) by mouth daily. Indications: HORMONE RECEPTOR POSITIVE BREAST CANCER  No current  facility-administered medications for this visit.         The following portions of the patient's history were reviewed and updated as appropriate: allergies, current medications, past family history, past medical history, past social history, past surgical history and problem list.    Review of Lab Results  Lab Results   Component Value Date    WBC 5.9 02/14/2014    HGB 12.1 02/14/2014    HCT 35.9 02/14/2014    PLT 235 02/14/2014    CREATININE 0.65 02/14/2014    BUN 18 02/14/2014    PROT 6.6 02/14/2014    AST 18 04/13/2011    ALT 11 02/14/2014    BILITOT 0.4 02/14/2014    CALCIUM 9.2 02/14/2014    MG 2.0 04/02/2013     Lab Results   Component Value Date    LYMPHSABS 2584 02/14/2014    ALKPHOS 44 02/14/2014     No results found for: PROTIME  Lab Results   Component Value Date    TSH 1.36 04/02/2013     No results found for: SPEP     Imaging   Nm Injection Sentinel Node Wo Imaging    06/21/2013   The patient has right breast cancer and is referred for sentinel lymph node mapping prior to surgery. The patient received 0.55 millicuries of Tc-62m Lymphoseek intradermally. No imaging was performed at the request of Dr. Lucille Passy.     Report Verified by: Lovey Newcomer, M.D. at 06/21/2013 5:31 PM      Investigations R      Medical Decision Making:  The following items were considered in medical decision making:  Review / order clinical lab tests  Review / order radiology tests  Review / order other diagnostic tests/interventions  Reviewed outside records  Medication toxicity monitoring performed

## 2014-02-28 ENCOUNTER — Ambulatory Visit: Admit: 2014-02-28 | Payer: PRIVATE HEALTH INSURANCE

## 2014-02-28 DIAGNOSIS — Z9013 Acquired absence of bilateral breasts and nipples: Secondary | ICD-10-CM

## 2014-02-28 NOTE — Unmapped (Signed)
Post op visit  59 year old woman who is status post exchange of breast tissue expanders for bilateral implants. Since her last visit she reports she is returned to work. She is doing well and denies any significant problems since returning work. She doesn't she has some discomfort when she is sleeping. This is been helped by wearing a bra night however she feels very sore which is mostly medially oriented. It is not worse on either side compared to the other.  She denies having any signs or symptoms of infection including increased swelling on one side, fevers, chills, drainage or increased redness.    Procedure:Exchange bilateral tissue expanders for permanent implants    Date: 11/26/2013    Complaints: uncomfortable when sleeping      Findings: healed bilateral breast incisions; no appreciable fluid collections bilaterally; good ptosis of the bilateral breasts noted; no erythema or ecchymoses; patient has a superior full deficiencies bilaterally      Assessment:  59 year old woman status post exchange of bilateral tissue expanders for silicone breast implants doing well postoperatively.    Plan:  1. I discussed with the patient that for her next stage of reconstruction, should she decide to pursue this, we would be discussing possible nipple reconstruction and fat grafting to improve the superior pole fullness. Some of the excess tissue that she was complaining about previously has resolved on its own. She notes that she would like a somewhat more superior position of the implants. We discussed that while a mastopexy-type procedure could be performed to help reposition the implants, they would unlikely stay in that position due to the weight of the implants and gravity. In order to achieve this appearance, fat grafting to the superior pole bilaterally would give her a more lasting result. The patient states that she will think about this and should she decide to pursue surgery, this would be sometime down the  road.  2. At this time I will have the patient return to clinic in approximately 6 months. Should she decide that she would like to undergo revision before then, she is welcome to call to schedule an earlier appointment. However given that she is not interested in revision at this time, there is no need for the patient to return to clinic, unless she is having problems, for at least 6 months.

## 2014-03-31 ENCOUNTER — Emergency Department: Admit: 2014-03-31 | Payer: PRIVATE HEALTH INSURANCE

## 2014-03-31 ENCOUNTER — Inpatient Hospital Stay
Admit: 2014-03-31 | Discharge: 2014-03-31 | Disposition: A | Discharge status: Home / Self Care | Payer: PRIVATE HEALTH INSURANCE

## 2014-03-31 DIAGNOSIS — N390 Urinary tract infection, site not specified: Secondary | ICD-10-CM

## 2014-03-31 LAB — URINALYSIS-MACROSCOPIC W/REFLEX TO MICROSCOPIC
Bilirubin, UA: NEGATIVE
Glucose, UA: 100 mg/dL
Nitrite, UA: POSITIVE
Protein, UA: 100 mg/dL
Specific Gravity, UA: 1.015 (ref 1.005–1.035)
Urobilinogen, UA: 2 EU/dL (ref 0.2–1.0)
pH, UA: 5 (ref 5.0–8.0)

## 2014-03-31 LAB — CBC
Hematocrit: 36.9 % (ref 35.0–45.0)
Hemoglobin: 12.5 g/dL (ref 11.7–15.5)
MCH: 29.7 pg (ref 27.0–33.0)
MCHC: 33.9 g/dL (ref 32.0–36.0)
MCV: 87.6 fL (ref 80.0–100.0)
MPV: 7.2 fL (ref 7.5–11.5)
Platelets: 223 10*3/uL (ref 140–400)
RBC: 4.21 10*6/uL (ref 3.80–5.10)
RDW: 12.8 % (ref 11.0–15.0)
WBC: 7.4 10*3/uL (ref 3.8–10.8)

## 2014-03-31 LAB — BASIC METABOLIC PANEL
Anion Gap: 4 mmol/L (ref 3–16)
BUN: 20 mg/dL (ref 7–25)
CO2: 28 mmol/L (ref 21–33)
Calcium: 9.3 mg/dL (ref 8.6–10.3)
Chloride: 107 mmol/L (ref 98–110)
Creatinine: 0.7 mg/dL (ref 0.60–1.30)
Glucose: 90 mg/dL (ref 70–100)
Osmolality, Calculated: 290 mOsm/kg (ref 278–305)
Potassium: 4.1 mmol/L (ref 3.5–5.3)
Sodium: 139 mmol/L (ref 133–146)
eGFR AA CKD-EPI: >90 See note.
eGFR NONAA CKD-EPI: >90 See note.

## 2014-03-31 LAB — URINALYSIS, MICROSCOPIC
Hyaline Casts, UA: 1 /LPF (ref 0–2)
RBC, UA: 4 /HPF (ref 0–3)
Squam Epithel, UA: 2 /HPF (ref 0–5)
WBC, UA: 7 /HPF (ref 0–5)

## 2014-03-31 LAB — DIFFERENTIAL
Basophils Absolute: 44 /uL (ref 0–200)
Basophils Relative: 0.6 % (ref 0.0–1.0)
Eosinophils Absolute: 118 /uL (ref 15–500)
Eosinophils Relative: 1.6 % (ref 0.0–8.0)
Lymphocytes Absolute: 3219 /uL (ref 850–3900)
Lymphocytes Relative: 43.5 % (ref 15.0–45.0)
Monocytes Absolute: 577 /uL (ref 200–950)
Monocytes Relative: 7.8 % (ref 0.0–12.0)
Neutrophils Absolute: 3441 /uL (ref 1500–7800)
Neutrophils Relative: 46.5 % (ref 40.0–80.0)

## 2014-03-31 MED ORDER — lactated ringers 1,000 mL IV fluid
Freq: Once | INTRAVENOUS | Status: AC
Start: 2014-03-31 — End: 2014-03-31
  Administered 2014-03-31: 18:00:00 1000 mL via INTRAVENOUS

## 2014-03-31 MED ORDER — ibuprofen (ADVIL,MOTRIN) tablet 800 mg
400 | Freq: Once | ORAL | Status: AC
Start: 2014-03-31 — End: 2014-03-31
  Administered 2014-03-31: 20:00:00 800 mg via ORAL

## 2014-03-31 MED ORDER — HYDROmorphone (DILAUDID) injection Syrg 0.5 mg
2 | Freq: Once | INTRAMUSCULAR | Status: AC
Start: 2014-03-31 — End: 2014-03-31

## 2014-03-31 MED ORDER — cefTRIAXone (ROCEPHIN) 2 g in D5W 50 mL IVPB (Duplex)
2 | Freq: Once | INTRAVENOUS | Status: AC
Start: 2014-03-31 — End: 2014-03-31

## 2014-03-31 MED ORDER — cephALEXin (KEFLEX) 500 MG capsule
500 | ORAL_CAPSULE | Freq: Four times a day (QID) | ORAL | Status: AC
Start: 2014-03-31 — End: 2014-04-05

## 2014-03-31 MED ORDER — cefTRIAXone (ROCEPHIN) 1 g in D5W 50 mL IVPB (Duplex)
1 | Freq: Once | INTRAVENOUS | Status: AC
Start: 2014-03-31 — End: 2014-03-31
  Administered 2014-03-31: 20:00:00 1 g via INTRAVENOUS

## 2014-03-31 MED ORDER — ondansetron (ZOFRAN) 4 mg/2 mL injection 4 mg
4 | Freq: Once | INTRAMUSCULAR | Status: AC
Start: 2014-03-31 — End: 2014-03-31
  Administered 2014-03-31: 18:00:00 4 mg via INTRAVENOUS

## 2014-03-31 MED FILL — HYDROMORPHONE 2 MG/ML INJECTION SYRINGE: 2 2 mg/mL | INTRAMUSCULAR | Qty: 1

## 2014-03-31 MED FILL — CEFTRIAXONE 1 GRAM/50 ML IN DEXTROSE (ISO-OSMOT) INTRAVENOUS PIGGYBACK: 1 1 gram/50 mL | INTRAVENOUS | Qty: 50

## 2014-03-31 MED FILL — ONDANSETRON HCL (PF) 4 MG/2 ML INJECTION SOLUTION: 4 4 mg/2 mL | INTRAMUSCULAR | Qty: 2

## 2014-03-31 MED FILL — IBUPROFEN 400 MG TABLET: 400 400 MG | ORAL | Qty: 2

## 2014-03-31 NOTE — Unmapped (Signed)
[place] ED Note    03/31/2014    Reason for Visit: Flank Pain        Patient History     HPI:  Sarah Huang is a 59 y.o. White or Caucasian female with a PMH of kidney stones requiring lithotripsy and as described below who presents to the ED today with a chief complaint of abdominal pain.  Patient diagnosed with a urinary tract infection and hematuria at urgent care yesterday and given prescription for macrobid and pyridium.  Today she is returning with continued suprapubic pain described as a pressure sensation which is worse with urination.  She also endorses bilateral low back pain described as a sharp sensation right worse than left which radiates to her right abdomen.  Pain is moderate in severity.  No other exacerbating or remitting factors.  Pain is constant.  She denies fevers, chills, nausea, vomiting, chest pain, shortness of breath, blood in stool, black stool.  She does report increased urinary frequency and urgency.    The patient states that there were no other associated signs and symptoms or modifying factors.      Past Medical History   Diagnosis Date   ??? Hyperlipidemia    ??? Kidney stones    ??? Depression    ??? Dyslipidemia    ??? Sleep apnea 06/17/2013     not using C-PAP   ??? GERD (gastroesophageal reflux disease)    ??? Constipation 06/17/2013     IIBS   ??? Dermatitis    ??? Psoriasis    ??? Cancer 06/2013     (R) breast       Past Surgical History   Procedure Laterality Date   ??? Tonsillectomy       in the past   ??? Salpingoophorectomy  2004     Hysty/BSO-etopic pregnancy/prolapse   ??? Sinus surgery       hx sinus surg x3   ??? Mid urethral sling   2002     at time of [facility]   ??? Esophagogastroduodenoscopy N/A 05/29/2012     Procedure: EGD;  Surgeon: Langley Adie, MD;  Location: [facility] ENDOS[facility];  Service: Gastroenterology;  Laterality: N/A;   ??? Colonoscopy N/A 05/29/2012     Procedure: COLONOSCOPY W/ OR W/O BIOPSY;  Surgeon: Langley Adie, MD;  Location: [facility]  ENDOSCOPY;  Service: Gastroenterology;  Laterality: N/A;  limited colonoscopy.   ??? Colonoscopy N/A 06/27/2012     Procedure: COLONOSCOPY W/ OR W/O BIOPSY;  Surgeon: Langley Adie, MD;  Location: [facility];  Service: Gastroenterology;  Laterality: N/A;  colonoscopy   ??? Hysterectomy  0220     Hysty/BSO 2004-etopic pregnancy/prolapse   ??? Remove breast tissue expanders insertion implants Bilateral 06/19/2013     Procedure: Dr. Jens Som at 1:30pm-placement of bilateral tissue expanders;  Surgeon: Yolanda Manges, MD;  Location: [facility] OR;  Service: Plastics;  Laterality: Bilateral;   ??? Dissection axillary Right 06/19/2013     Procedure: -POSS RIGHT AXILLARY NODE DISSECTION;  Surgeon: Cristal Generous, MD;  Location: [facility] OR;  Service: General;  Laterality: Right;   ??? Breast surgery  06/19/13     immediate bilateral TE   ??? Remove breast tissue expanders insertion implants Bilateral 11/26/2013     Procedure: EXCHANGE BILATERAL TISSUE EXPANDER FOR IMPLANT;  Surgeon: Jonita Albee, MD;  Location: [facility];  Service: Plastics;  Laterality: Bilateral;       History     Social History   ??? Marital Status: Divorced  Spouse Name: N/A     Number of Children: N/A   ??? Years of Education: N/A     Occupational History   ??? Not on file.     Social History Main Topics   ??? Smoking status: Former Smoker     Quit date: 11/11/1989   ??? Smokeless tobacco: Never Used      Comment: quit 1991   ??? Alcohol Use: No      Comment: Rare 04-02-13   ??? Drug Use: No      Comment: 04-02-13   ??? Sexual Activity: No     Other Topics Concern   ??? Caffeine Use Yes   ??? Occupational Exposure No   ??? Exercise No   ??? Seat Belt Yes     Social History Narrative       family history includes Breast cancer in her other, other, and paternal aunt; COPD in her brother, father, mother, other, and sister; Coronary artery disease in her father and mother; Diabetes in her mother; Emphysema in her father; Heart attack in her brother; Heart disease in her brother and mother;  Heart failure in her father; Hyperlipidemia in her father and mother; Hypertension in her mother; Other in her brother and sister; Psoriasis in her brother and sister. There is no history of Eczema or Melanoma.    Allergies   Allergen Reactions   ??? Crestor [Rosuvastatin] Other (See Comments)     Causes tightening in leg muscles.       Discharge Medication List as of 03/31/2014  4:40 PM      CONTINUE these medications which have NOT CHANGED    Details   alendronate (FOSAMAX) 70 MG tablet Take 1 tablet (70 mg total) by mouth every 7 days., Starting 01/20/2014, Until Discontinued, Normal      atorvastatin (LIPITOR) 40 MG tablet Take 1 tablet (40 mg total) by mouth daily., Starting 01/20/2014, Until Discontinued, Normal      cetirizine (ZYRTEC) 10 MG tablet Take 10 mg by mouth daily., Until Discontinued, Historical Med      ergocalciferol (VITAMIN D2) 50,000 unit capsule Take 1 capsule (50,000 Units total) by mouth once a week., Starting 01/20/2014, Until Discontinued, Normal      glucosamine-chondroitin 500-400 mg tablet Take 1 tablet by mouth 3 times a day., Until Discontinued, Historical Med      ibuprofen (ADVIL,MOTRIN) 800 MG tablet Take 1 tablet (800 mg total) by mouth every 8 hours as needed., Starting 01/20/2014, Until Discontinued, Normal      magnesium 250 mg Tab Take by mouth., Until Discontinued, Historical Med      montelukast (SINGULAIR) 10 mg tablet Take 1 tablet (10 mg total) by mouth at bedtime., Starting 01/20/2014, Until Discontinued, Normal      nitrofurantoin, macrocrystal-monohydrate, (MACROBID) 100 MG capsule Take 100 mg by mouth 2 times a day., Until Discontinued, Historical Med      omeprazole (PRILOSEC) 40 MG capsule Take 1 capsule (40 mg total) by mouth daily., Starting 01/20/2014, Until Discontinued, Normal      phenazopyridine (PYRIDIUM) 200 MG tablet Take 200 mg by mouth 3 times a day with meals., Until Discontinued, Historical Med      SUMAtriptan (IMITREX) 100 MG tablet Take one tablet po daily  as needed for migraine, may repeat in 2 hours if needed in 24 hours., Normal      tamoxifen (NOLVADEX) 20 MG tablet Take 1 tablet (20 mg total) by mouth daily. Indications: HORMONE RECEPTOR POSITIVE BREAST CANCER, Starting 02/14/2014,  Until Discontinued, Normal      ZOVIRAX 5 % cream APPLY TOPICALLY 5 TIMES DAILY AS NEEDED, Normal      acetaminophen (TYLENOL) 325 MG tablet Take 2 tablets (650 mg total) by mouth every 6 hours as needed for Pain., Starting 06/21/2013, Until Discontinued, Print      efinaconazole (JUBLIA) 10 % SolA Apply 1 drop topically daily., Starting 09/04/2013, Until Discontinued, Normal      omega-3 fatty acids-fish oil (FISH OIL) 360-1,200 mg Cap Take 1 capsule (1,200 mg total) by mouth daily., Starting 11/20/2012, Until Discontinued, Normal      omega-3 fatty acids-vitamin E (FISH OIL) 1,000 mg Cap Take by mouth., Until Discontinued, Historical Med               All nursing notes and triage notes were appropriately reviewed in the course of the creation of this note.     Review of Systems     ROS:    CONSTITUTIONAL:  As per HPI.  HEENT:  Eyes:  No diplopia/blurred vision. ENT:  No earache, sore throat or runny nose.  CARDIOVASCULAR:  No pressure, squeezing, tightness, aching in the chest.  RESPIRATORY:  No cough, shortness of breath, PND or orthopnea.  GASTROINTESTINAL:  No nausea, vomiting or diarrhea.  GENITOURINARY:  No hematuria.  MUSCULOSKELETAL:  As per HPI.  SKIN:  No change in skin, hair or nails. No rashes or redness.  NEUROLOGIC:  No vertigo, lightheadedness, paresthesias, or weakness.  PSYCHIATRIC:  No disorder of thought or mood.  ENDOCRINE:  No heat or cold intolerance, polyuria or polydipsia.  HEMATOLOGICAL:  No easy bruising or bleeding.          Physical Exam     Filed Vitals:    03/31/14 1338   BP: 104/53   Pulse: 69   Temp: 97.3 ??F (36.3 ??C)   TempSrc: Oral   Resp: 18   SpO2: 96%         Physical Exam   Constitutional: She is oriented to person, place, and time. She appears  well-developed and well-nourished. No distress.   HENT:   Head: Normocephalic and atraumatic.   Mouth/Throat: Oropharynx is clear and moist.   Eyes: EOM are normal. Pupils are equal, round, and reactive to light. No scleral icterus.   Neck: Normal range of motion. Neck supple. No JVD present.   Cardiovascular: Normal rate, regular rhythm, normal heart sounds and intact distal pulses.    No murmur heard.  Pulmonary/Chest: Effort normal and breath sounds normal. She has no wheezes. She has no rales.   Abdominal: Soft. There is no rebound and no guarding.   Mild tenderness to palpation of suprapubic area.  No RLQ tenderness.  No CVA tenderness bilaterally.    Musculoskeletal: Normal range of motion. She exhibits no edema or tenderness.   Lymphadenopathy:     She has no cervical adenopathy.   Neurological: She is alert and oriented to person, place, and time. No cranial nerve deficit. She exhibits normal muscle tone.   Skin: Skin is warm and dry. No erythema. No pallor.   Psychiatric: She has a normal mood and affect. Her behavior is normal.   Nursing note and vitals reviewed.      Diagnostic Studies     Labs:    Please see electronic medical record for any tests performed in the ED     Labs Reviewed   URINALYSIS-MACROSCOPIC W/REFLEX TO MICROSCOPIC - Abnormal; Notable for the following:     Protein,  UA 100 mg/dL (*)     Glucose, UA 161 mg/dL (*)     Ketones, UA Trace (*)     Blood, UA Trace-lysed (*)     Nitrite, UA Positive (*)     Leukocytes, UA Large (*)     All other components within normal limits   CBC - Abnormal; Notable for the following:     MPV 7.2 (*)     All other components within normal limits   URINALYSIS, MICROSCOPIC - Abnormal; Notable for the following:     RBC, UA 4 (*)     WBC, UA 7 (*)     Bacteria, UA Rare (*)     Amorphous, UA Present (*)     All other components within normal limits   BASIC METABOLIC PANEL    Narrative:     As of 03/11/2014 the estimated GFR is calculated from serum creatinine  using the Chronic Kidney Disease  Epidemiology Collaboration (CKD-EPI) equation in patients 18 years and older.  The reference range is   >60 mL/min/1.3m2.  eGFR values greater than 90 will be reported as >53mL/min/1.73m2.  Reference: Cherie Dark AS, Suella Grove [facility], Stefano Gaul AF, 3rd, Feldman HI, et. al.  A new equation to estimate glomerular filtration rate.  Ann Intern Med. 2009:150(9):604-12   DIFFERENTIAL       Radiology:    Please see electronic medical record for any tests performed in the ED    CT ABDOMEN AND PELVIS WO IV CONTRAST    EKG:    none      Emergency Department Procedures         ED Course and MDM     SELBY SLOVACEK is a 59 y.o. White or Caucasian female  who presented to the emergency department with Flank Pain   who was examined by myself and the attending physician Dr. Lowella Petties.    This is a patient with history of kidney stones with known current UTI presenting with continued suprapubic pain, dysuria, urinary frequency.  She had been given pyridium and macrobid at an urgent care yesterday.  Due to history of stones and hematuria, concern for infected stone.  Will obtain CT abdomen and pelvis.    CBC shows no evidence of leukocytosis or anemia.  Renal panel shows normal creatinine and electrolytes.  Urinalysis consistent with UTI.  CT abdomen/pelvis without contrast shows no evidence of renal stone or hydronephrosis.  At this time we will treat the patient with dose of Rocephin here in ED and give her a prescription for Keflex outpatient.    The patient was given my strict and standard return precautions as well as return precautions specific to the presenting diagnosis.  The patient voiced understanding with the above plan and discharged home in good condition.      ED Treatment:  Medications   lactated ringers 1,000 mL IV fluid (0 mLs Intravenous Stopped 03/31/14 1640)   ondansetron (ZOFRAN) 4 mg/2 mL injection 4 mg (4 mg Intravenous Given 03/31/14 1419)   ibuprofen (ADVIL,MOTRIN)  tablet 800 mg (800 mg Oral Given 03/31/14 1547)   cefTRIAXone (ROCEPHIN) 1 g in D5W 50 mL IVPB (Duplex) (0 g Intravenous Stopped 03/31/14 1640)       Impression:  1) UTI    Plan:  1) Discharge  2) Rocephin in ED  3) Keflex outpatient  4) Follow-up with PCP  5) Return precautions    Roanna Raider MD  [facility] Emergency Medicine R1  03/31/2014      Critical Care Time (Attendings)             Kaleen Odea, MD  Resident  03/31/14 [phone]

## 2014-03-31 NOTE — Unmapped (Signed)
[place] ED  Reassessment Note    Sarah Huang is a 59 y.o. female who presented to the emergency department on 03/31/2014. This patient was initially seen by an off-going provider and their care has been turned over to me. Please see the original provider's note for details regarding the initial history, physical exam and ED course.  At the time of turnover the following steps in the patient's evaluation were pending:     Rocephin administration  Disposition    Upon my initial assessment of the patient in the emergency room, she is well-appearing and his vital signs within normal limits.  When grandma Rocephin was a minister to the patient prior to disposition and she has been on outpatient regimen that failed for her urinary tract infection.  She'll be discharged with Keflex and with close follow-up appointment with her primary care physician.  The patient understands the discharge instructions and was given strict return precautions.        Clinical Impression:    1.  Urinary tract infection

## 2014-03-31 NOTE — Unmapped (Signed)
You were seen in the Emergency Department for urinary tract infection.     You were prescribed Keflex. Take it as directed     Please follow up with your primary care physician. If you do not have a primary care physician, please choose one from the list below.     Primary Care Physicians & Clinics    Please use the below information to establish care with a primary care doctor. You can use them to follow-up on the condition you were seen in the ED today for and also to attend to your other health needs. Primary care is very important as medical care is best when a provider know you and your medical history.    [facility] Accepting Self Pay Patients on Sliding Scale Payment   Zip Code Name Address Phone Sliding Scale Area   [phone] [facility] [address] [phone] $15 [place] residents   [phone] [facility] [address] [phone] $20 No Restrictions   [phone] [facility] [address] [phone] $10 No Restrictions   [phone] [phone] $40 No Restrictions   [phone] [facility] [address] [phone] $20 No Restrictions   [phone] [facility] [address] [phone] $40 No Restrictions   [phone] [facility] [address] [phone] $25 No Restrictions   [phone] [facility] [address] [phone] $20 [place] Residents   [phone] [facility] [address] [phone] $20 [place] Residents   [phone] [facility] [address] [phone] $20 [place] Residents   [phone] [facility] [address] [phone] $20 [place] Residents   [phone] [facility] [address] [phone] $20 [place] Residents     Other Health Clinics     [facility]   [facility]   [address]   [phone]     [facility]   [address]   [phone]     [facility] Resident  Practice   [address]   [phone]     [facility]   [address]   [phone]     [facility]   [address]   [phone]    [facility]   [address]   [address]   [phone]     [place]   [phone] [place]   [phone]     [place]   [address]   [phone]     [place]   [address]   [phone]     [place]   [address]   [phone]            Please return to the ED if your symptoms worsen or do not resolve; or if you develop any chest pain, shortness of breath, abdominal pain, uncontrollable vomiting, unable to eat, passing out, difficulty moving your arms or legs, difficulty speaking, fever (greater than 101 degrees), or any concern that you feel needs acute physician evaluation.

## 2014-03-31 NOTE — Unmapped (Signed)
ED Attending Attestation Note    Date of service:  03/31/2014    This patient was seen by the resident physician.  I have seen and examined the patient, agree with the workup, evaluation, management and diagnosis. The care plan has been discussed and I concur.     My assessment reveals a 59 y.o. female who yesterday began experiencing right flank pressure and right lower quadrant pressure surgically when she voided.  She went to local urgent care and was given Pyridium and Macrobid and diagnosis with a possible kidney infection, possible renal colic.  She continues to have tenderness at 10 pain in the right flank with pressure in the right lower quadrant specifically because region.  No fevers.  On physical exam she is uncomfortable appearing with tenderness to percussion of the right flank.

## 2014-04-07 ENCOUNTER — Other Ambulatory Visit: Admit: 2014-04-07 | Payer: PRIVATE HEALTH INSURANCE

## 2014-04-07 ENCOUNTER — Ambulatory Visit: Admit: 2014-04-07 | Discharge: 2014-04-07 | Payer: PRIVATE HEALTH INSURANCE

## 2014-04-07 DIAGNOSIS — Z0000 Encounter for general adult medical examination without abnormal findings: Secondary | ICD-10-CM

## 2014-04-07 DIAGNOSIS — E7800 Pure hypercholesterolemia, unspecified: Secondary | ICD-10-CM

## 2014-04-07 LAB — COMPREHENSIVE METABOLIC PANEL, SERUM
ALT: 14 U/L (ref 7–52)
AST (SGOT): 15 U/L (ref 13–39)
Albumin: 4 g/dL (ref 3.5–5.7)
Alkaline Phosphatase: 48 U/L (ref 36–125)
Anion Gap: 7 mmol/L (ref 3–16)
BUN: 15 mg/dL (ref 7–25)
CO2: 31 mmol/L (ref 21–33)
Calcium: 9.5 mg/dL (ref 8.6–10.3)
Chloride: 104 mmol/L (ref 98–110)
Creatinine: 0.57 mg/dL (ref 0.60–1.30)
Glucose: 90 mg/dL (ref 70–100)
Osmolality, Calculated: 294 mOsm/kg (ref 278–305)
Potassium: 4.7 mmol/L (ref 3.5–5.3)
Sodium: 142 mmol/L (ref 133–146)
Total Bilirubin: 0.4 mg/dL (ref 0.0–1.5)
Total Protein: 6.9 g/dL (ref 6.4–8.9)
eGFR AA CKD-EPI: >90 See note.
eGFR NONAA CKD-EPI: >90 See note.

## 2014-04-07 LAB — TSH: TSH: 0.91 u[IU]/mL (ref 0.34–5.60)

## 2014-04-07 LAB — LIPID PANEL
Cholesterol, Total: 154 mg/dL (ref 0–200)
HDL: 40 mg/dL — ABNORMAL LOW (ref 60–92)
LDL Cholesterol: 71 mg/dL
Triglycerides: 215 mg/dL — ABNORMAL HIGH (ref 10–149)

## 2014-04-07 LAB — POCT URINALYSIS DIPSTICK, NONAUTOMATED; W/O MICRO
POCT - Bilirubin, UA: NEGATIVE
POCT - Glucose, UA: NEGATIVE
POCT - Ketones, UA: NEGATIVE
POCT - Leukocytes Esterase, UA: NEGATIVE
POCT - Nitrite, UA: NEGATIVE
POCT - Protein, UA: NEGATIVE
POCT - Specific Gravity, Urine: 1.005
POCT - Urobilinogen, UA: NEGATIVE
POCT - pH, UA: 7

## 2014-04-07 LAB — MAGNESIUM: Magnesium: 1.9 mg/dL (ref 1.5–2.5)

## 2014-04-07 NOTE — Unmapped (Signed)
Subjective  HPI:   Patient ID: Sarah Huang is a 59 y.o. female.    Chief Complaint:  HPI     Here for annual exam.    Just finished antibiotic for UTI.  STill going more frequently. Three to four times nightly, 8 times daily.  Did have dysuria, initially macrobid, stopped after going to ed, finished keflex yesterday.  No back pain, no fever.  Did have CT abd at ED, did not show kidney stone. Pt does have history of this.    Recent breast reconstuction sx.    Off alendronate for a month. Back on for past three week.s    [phone]  Urgent care. Had urine culture.      Medications:  Current Outpatient Prescriptions   Medication Sig Dispense Refill   ??? alendronate (FOSAMAX) 70 MG tablet Take 1 tablet (70 mg total) by mouth every 7 days. 12 tablet 3   ??? atorvastin 40 mg once daily  90 tablet 3   ??? cetirizine (ZYRTEC) 10 MG tablet Take 10 mg by mouth daily.     ??? ergocalciferol (VITAMIN D2) 50,000 unit capsule Take 1 capsule (50,000 Units total) by mouth once a week. 12 capsule 1   ??? glucosamine-chondroitin 500-400 mg tablet Take 1 tablet by mouth 3 times a day.     ??? ibuprofen (ADVIL,MOTRIN) 800 MG tablet Take 1 tablet (800 mg total) by mouth every 8 hours as needed. 180 tablet 0   ??? montelukast (SINGULAIR) 10 mg tablet Take 1 tablet (10 mg total) by mouth at bedtime. 90 tablet 3   ??? omeprazole (PRILOSEC) 40 MG capsule Take 1 capsule (40 mg total) by mouth daily. 90 capsule 3   ???       ??? SUMAtriptan (IMITREX) 100 MG tablet Take one tablet po daily as needed for migraine, may repeat in 2 hours if needed in 24 hours. 12 tablet 2   ??? tamoxifen (NOLVADEX) 20 MG tablet Take 1 tablet (20 mg total) by mouth daily. Indications: HORMONE RECEPTOR POSITIVE BREAST CANCER 30 tablet 0   ??? ZOVIRAX 5 % cream APPLY TOPICALLY 5 TIMES DAILY AS NEEDED 15 g 0   ??? acetaminophen (TYLENOL) 325 MG tablet Take 2 tablets (650 mg total) by mouth every 6 hours as needed for Pain. 40 tablet 0   ??? efinaconazole (JUBLIA) 10 % SolA Apply 1 drop  topically daily. 8 mL 11   ??? magnesium 250 mg Tab Take by mouth.     ???       ??? omega-3 fatty acids-fish oil (FISH OIL) 360-1,200 mg Cap Take 1 capsule (1,200 mg total) by mouth daily. 30 capsule 5   ??? omega-3 fatty acids-vitamin E (FISH OIL) 1,000 mg Cap Take by mouth.       No current facility-administered medications for this visit.        ROS:   Review of Systems   Constitutional: Positive for fever. Negative for chills.   HENT: Positive for rhinorrhea.    Eyes: Positive for visual disturbance (needs vision exam, blurry vision. thinks from tamoxifen).   Respiratory: Negative for cough, shortness of breath and wheezing.    Cardiovascular: Negative for chest pain, palpitations and leg swelling.   Gastrointestinal: Negative for nausea (had with UTI, now resolved.) and abdominal pain.   Genitourinary: Positive for urgency and frequency. Negative for dysuria, hematuria (did have on onset of infection, none now), flank pain, vaginal bleeding and vaginal discharge.   Musculoskeletal: Negative for back  pain and arthralgias.   Skin: Negative for rash.   Neurological: Positive for light-headedness (thinks due to allegies, equilibrium off.). Negative for dizziness and headaches.   All other systems reviewed and are negative.         Objective:   Physical Exam   Constitutional: She is oriented to person, place, and time. She appears well-developed and well-nourished. No distress.   HENT:   Head: Normocephalic and atraumatic.   Right Ear: Hearing, tympanic membrane, external ear and ear canal normal.   Left Ear: Hearing, tympanic membrane, external ear and ear canal normal.   Nose: Nose normal.   Mouth/Throat: Oropharynx is clear and moist and mucous membranes are normal. No oropharyngeal exudate.   Eyes: Conjunctivae and EOM are normal. Pupils are equal, round, and reactive to light. Right eye exhibits no discharge. Left eye exhibits no discharge. No scleral icterus.   Neck: Normal range of motion. Neck supple. No  thyromegaly present.   Cardiovascular: Normal rate, regular rhythm, normal heart sounds and intact distal pulses.  Exam reveals no gallop and no friction rub.    No murmur heard.  Pulmonary/Chest: Effort normal and breath sounds normal. She has no wheezes. She has no rales.   Abdominal: Bowel sounds are normal. She exhibits no distension and no mass. There is no hepatosplenomegaly. There is no tenderness. There is no rebound and no guarding.   Genitourinary:   bilat breast reconstuction, well healed surgical incisions.   Musculoskeletal: Normal range of motion. She exhibits no edema.   Lymphadenopathy:     She has no cervical adenopathy.   Neurological: She is alert and oriented to person, place, and time. She has normal reflexes.   Skin: Skin is warm and dry. No rash noted. She is not diaphoretic. No erythema.   Psychiatric: She has a normal mood and affect. Her behavior is normal. Judgment and thought content normal.             Filed Vitals:    04/07/14 1006   BP: 130/72   Pulse: 76   Height: 5' 2.5 (1.588 m)   Weight: 203 lb (92.08 kg)     Body mass index is 36.51 kg/(m^2).  Body surface area is 2.02 meters squared.         Lab Results   Component Value Date    KETONESU negative 04/07/2014    BLOODU trace 04/07/2014    BILIRUBINUR negative 04/07/2014    UROBILINOGEN negative 04/07/2014    PROTEINUA negative 04/07/2014    NITRITE negative 04/07/2014    LEUKOCYTESUR negative 04/07/2014    PHUR 7.0 04/07/2014    LABSPEC 1.005 04/07/2014    CLARITYU clear 04/07/2014    COLORU Yellow 04/07/2014          Assessment/Plan:     Patient Active Problem List   Diagnosis   ??? Esophageal reflux   ??? Elevated blood pressure reading without diagnosis of hypertension   ??? Calculus of kidney   ??? Plantar fascial fibromatosis   ??? Insomnia   ??? Family history of diabetes mellitus   ??? Osteoporosis- back on fosamax. Due for DEXA.   ??? Postablative ovarian failure   ??? Fatty liver   ??? Weight gain   ??? Hypercholesteremia- check lipid panel.    ??? Urinary retention with incomplete bladder emptying   ??? Hearing loss   ??? Trochanteric bursitis of left hip   ??? SUI (stress urinary incontinence, female)   ??? Post-void dribbling   ??? Microscopic  hematuria   ??? Fibromyalgia   ??? Malignant neoplasm of right female breast   ??? Family history of cancer   ??? Family history of breast cancer   ??? S/P bilateral mastectomy   ??? Breast cancer, female   ??? Acquired absence of both breasts and nipples   ??? Chronic back pain   ??? Use of tamoxifen (Nolvadex)     Urinary symtpoms - trace blood on u/a, otherwise neg. Recent treatment for uti, antibiotics stopped yesterday. Will try to obtain sensitivies from culture done at urgent care on 03-31-14 to determine if treated adequately.      Preventative care - up to date immunizations and screening.  Check labs.

## 2014-04-08 MED ORDER — atorvastatin (LIPITOR) 40 MG tablet
40 | ORAL_TABLET | Freq: Every day | ORAL | Status: AC
Start: 2014-04-08 — End: 2014-10-30

## 2014-04-08 NOTE — Unmapped (Signed)
Received results from urgent care.  Urine culture shows E.coli sens to all antibiotics.  Still had trace LE on u/a in office. Will have her take macrobid , has rx from urgent care.  Advised to call if symptoms not resolved upon completion of antibiotics.  Also will try decreaseing atorvastatin to 20 mg, pt will take 1/2 tab.

## 2014-04-29 MED ORDER — ibuprofen (ADVIL,MOTRIN) 800 MG tablet
800 | ORAL_TABLET | ORAL | Status: AC
Start: 2014-04-29 — End: 2014-08-08

## 2014-04-29 NOTE — Unmapped (Signed)
Last refill 01-20-14  Last phy 04-07-14

## 2014-05-20 MED ORDER — sulfamethoxazole-trimethoprim (BACTRIM DS) 800-160 mg per tablet
800-160 | ORAL_TABLET | Freq: Two times a day (BID) | ORAL | Status: AC
Start: 2014-05-20 — End: 2014-05-27

## 2014-05-20 NOTE — Unmapped (Signed)
Addended by: Marjory Sneddon on: 05/20/2014 12:44 PM     Modules accepted: Orders

## 2014-05-21 ENCOUNTER — Other Ambulatory Visit: Admit: 2014-05-21 | Payer: PRIVATE HEALTH INSURANCE

## 2014-05-21 DIAGNOSIS — N390 Urinary tract infection, site not specified: Secondary | ICD-10-CM

## 2014-05-21 LAB — URINALYSIS W/RFL TO MICROSCOPIC
Bilirubin, UA: NEGATIVE
Glucose, UA: NEGATIVE mg/dL
Ketones, UA: NEGATIVE mg/dL
Nitrite, UA: NEGATIVE
Protein, UA: NEGATIVE mg/dL
RBC, UA: 3 /HPF (ref 0–3)
Specific Gravity, UA: 1.02 (ref 1.005–1.035)
Squam Epithel, UA: 6 /HPF (ref 0–5)
Urobilinogen, UA: <2 mg/dL (ref 0.2–1.9)
WBC, UA: 2 /HPF (ref 0–5)
pH, UA: 5 (ref 5.0–8.0)

## 2014-05-21 LAB — URINE CULTURE

## 2014-05-22 ENCOUNTER — Encounter

## 2014-07-25 ENCOUNTER — Ambulatory Visit: Admit: 2014-07-25 | Discharge: 2014-07-25 | Payer: PRIVATE HEALTH INSURANCE

## 2014-07-25 ENCOUNTER — Ambulatory Visit: Admit: 2014-07-26 | Payer: PRIVATE HEALTH INSURANCE

## 2014-07-25 DIAGNOSIS — N390 Urinary tract infection, site not specified: Secondary | ICD-10-CM

## 2014-07-25 MED ORDER — sulfamethoxazole-trimethoprim (BACTRIM,SEPTRA) 400-80 mg per tablet
400-80 | ORAL_TABLET | Freq: Every evening | ORAL | Status: AC
Start: 2014-07-25 — End: 2014-10-30

## 2014-07-25 MED ORDER — oxybutynin (DITROPAN-XL) 5 MG 24 hr tablet
5 | ORAL_TABLET | Freq: Every day | ORAL | Status: AC
Start: 2014-07-25 — End: 2014-10-30

## 2014-07-25 NOTE — Unmapped (Signed)
Chief Complaint   Patient presents with   ??? Recurrent Urinary Tract Infection          History of Present Illness  59 yo female G2P2 who presents for evaluation of recurrent UTI. 2 since April. Prior to that, last one 30 years ago. Symptomatic with urgency, frequency, dysuria, nocturia, worse incontinence.   Hx of hormone positive breast cancer. Finished tamoxifen 9 months ago. No chemo/radiation.  Hx of nephrolithiasis, lithotripsy once in the past.   Hx of bladder prolapse and stress incontinence. Had hysterectomy and bladder sling (mid-urethral sling) in 2002 in [place]. Worked well for many years but no longer working.  Still has stress incontinence, all the time.  Urge incontinence.   Uses 2 pads per day.  Does have hesitancy, difficulty at night.  Significant urgency, frequency - works long shifts, only goes 3-4 x daily.   Nocturia x 4 nightly.  Hx of microhematuria. Last workup in 2013 by Dr. Esperanza Sheets, normal cysto and CT.       Review of Systems   Constitutional: Negative for fever, chills and fatigue.   Genitourinary: Positive for dysuria, urgency, frequency and nocturia. Negative for difficulty urinating.       Allergies  Crestor    Medications  Outpatient Encounter Prescriptions as of 07/25/2014   Medication Sig Dispense Refill   ??? acetaminophen (TYLENOL) 325 MG tablet Take 2 tablets (650 mg total) by mouth every 6 hours as needed for Pain. 40 tablet 0   ??? alendronate (FOSAMAX) 70 MG tablet Take 1 tablet (70 mg total) by mouth every 7 days. 12 tablet 3   ??? atorvastatin (LIPITOR) 40 MG tablet Take 0.5 tablets (20 mg total) by mouth daily. 90 tablet 3   ??? cetirizine (ZYRTEC) 10 MG tablet Take 10 mg by mouth daily.     ??? ergocalciferol (VITAMIN D2) 50,000 unit capsule Take 1 capsule (50,000 Units total) by mouth once a week. 12 capsule 1   ??? glucosamine-chondroitin 500-400 mg tablet Take 1 tablet by mouth 3 times a day.     ??? ibuprofen (ADVIL,MOTRIN) 800 MG tablet Take 1 tablet by mouth  every 8 hours as needed  180 tablet 1   ??? montelukast (SINGULAIR) 10 mg tablet Take 1 tablet (10 mg total) by mouth at bedtime. 90 tablet 3   ??? omeprazole (PRILOSEC) 40 MG capsule Take 1 capsule (40 mg total) by mouth daily. 90 capsule 3   ??? phenazopyridine (PYRIDIUM) 200 MG tablet Take 200 mg by mouth 3 times a day with meals.     ??? SUMAtriptan (IMITREX) 100 MG tablet Take one tablet po daily as needed for migraine, may repeat in 2 hours if needed in 24 hours. 12 tablet 2   ??? tamoxifen (NOLVADEX) 20 MG tablet Take 1 tablet (20 mg total) by mouth daily. Indications: HORMONE RECEPTOR POSITIVE BREAST CANCER 30 tablet 0   ??? ZOVIRAX 5 % cream APPLY TOPICALLY 5 TIMES DAILY AS NEEDED 15 g 0   ??? [DISCONTINUED] efinaconazole (JUBLIA) 10 % SolA Apply 1 drop topically daily. 8 mL 11   ??? [DISCONTINUED] magnesium 250 mg Tab Take by mouth.     ??? [DISCONTINUED] omega-3 fatty acids-fish oil (FISH OIL) 360-1,200 mg Cap Take 1 capsule (1,200 mg total) by mouth daily. 30 capsule 5   ??? [DISCONTINUED] omega-3 fatty acids-vitamin E (FISH OIL) 1,000 mg Cap Take by mouth.       No facility-administered encounter medications on file as of 07/25/2014.  Histories  She has a past medical history of Hyperlipidemia; Kidney stones; Depression; Dyslipidemia; Sleep apnea (06/17/2013); GERD (gastroesophageal reflux disease); Constipation (06/17/2013); Dermatitis; Psoriasis; Cancer (06/2013); and Urinary tract infection.    She has past surgical history that includes Tonsillectomy; Salpingoophorectomy (2004); Sinus surgery; mid urethral sling  (2002); Esophagogastroduodenoscopy (N/A, 05/29/2012); Colonoscopy (N/A, 05/29/2012); Colonoscopy (N/A, 06/27/2012); Hysterectomy (0220); remove breast tissue expanders insertion implants (Bilateral, 06/19/2013); dissection axillary (Right, 06/19/2013); Breast surgery (06/19/13); and remove breast tissue expanders insertion implants (Bilateral, 11/26/2013).    Her family history includes Breast cancer in her other, other, and paternal  aunt; COPD in her brother, father, mother, other, and sister; Coronary artery disease in her father and mother; Diabetes in her mother; Emphysema in her father; Heart attack in her brother; Heart disease in her brother and mother; Heart failure in her father; Hyperlipidemia in her father and mother; Hypertension in her mother; Other in her brother and sister; Psoriasis in her brother and sister. There is no history of Eczema or Melanoma.    She reports that she quit smoking about 24 years ago. She has never used smokeless tobacco. She reports that she does not drink alcohol or use illicit drugs.    The following portions of the patient's history were reviewed and updated as appropriate: allergies, current medications, past family history, past medical history, past social history, past surgical history and problem list.    Blood pressure 143/85, pulse 70, temperature 97.3 ??F (36.3 ??C), temperature source Oral, resp. rate 16, height 5' 4 (1.626 m), weight 205 lb (92.987 kg).  Physical Exam   Constitutional: She is oriented to person, place, and time. She appears well-developed and well-nourished.   HENT:   Head: Normocephalic and atraumatic.   Eyes: Conjunctivae and EOM are normal.   Neck: Normal range of motion. Neck supple.   Pulmonary/Chest: Effort normal. No respiratory distress.   Abdominal: Soft. There is no tenderness.   No CVAT   Musculoskeletal: Normal range of motion. She exhibits no edema.   Neurological: She is alert and oriented to person, place, and time.   Skin: Skin is warm and dry.   Psychiatric: She has a normal mood and affect. Her behavior is normal.       PVR: 20  UA: small blood, neg leuks and nitrites     Assessment  OAB-wet  SUI  - hx of mid urethral sling in 2002, worked for a while   Recurrent UTI  - 2 in last 6 months, E coli - pansensitive  Microscopic hematuria  - has had one prior workup in 2013, normal cysto and CT  - CT A/P negative 03/2014  - hx of nephrolithiasis  Hx of breast cancer  - hormone positive, on tamoxifen, b/l mastectomy    Plan  Bladder diet  Kegal exercise  Start ditropan. Discussed SEs which include but not limited to constipation, dry mouth, difficulty voiding , weak stream and/or mental confusion and drawsiness   Urine culture  Start low dose Bactrim for prophylaxis if urine culture is negative    Return for cysto and vaginal exam       Medical Decision Making  The following items were considered in medical decision making:  Review / order clinical lab tests  Review / order radiology tests  Review / order other diagnostic tests/interventions     I performed a history and physical exam of the patient and discussed patient management with the fellow I reviewed the fellow's note and agree with  the documented findings and plan of care.

## 2014-07-26 LAB — URINE CULTURE

## 2014-08-08 MED ORDER — ibuprofen (ADVIL,MOTRIN) 800 MG tablet
800 | ORAL_TABLET | ORAL | Status: AC
Start: 2014-08-08 — End: 2014-10-13

## 2014-08-08 NOTE — Unmapped (Signed)
Last refill 04-29-14  Last OV 04-07-14  Last phy 04-07-14

## 2014-08-15 ENCOUNTER — Ambulatory Visit: Admit: 2014-08-15 | Discharge: 2014-08-15 | Payer: PRIVATE HEALTH INSURANCE

## 2014-08-15 DIAGNOSIS — C50919 Malignant neoplasm of unspecified site of unspecified female breast: Secondary | ICD-10-CM

## 2014-08-15 MED ORDER — estradiol (ESTRACE) 0.01 % (0.1 mg/gram) vaginal cream
0.01 | VAGINAL | 1.00 refills | 30.00000 days | Status: AC
Start: 2014-08-15 — End: 2014-10-03

## 2014-08-15 NOTE — Unmapped (Signed)
Chief Complaint   Patient presents with   ??? Follow-up          History of Present Illness  Sarah Huang is a 59 y.o. female   Ref: Marjory Sneddon, MD  [address]  [address]  [place], [place] [address]    Dx:  Sarah Huang is a 59 y.o. female who presents due to newly diagnosed right breast cancer. She presented for screening mammography in early April where several changes were noted in both breasts. 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 The left breast lesion turned out to be a cyst which was aspirated and resolved.   She denies and breast changes, symptoms, or other concerns.   Patient is s/p Bilateral skin-sparing    total mastectomies (for R multicentric ductal carcinoma and L prophylactic),   with Right ax SN LN bx on 06/19/13.   and immediate reconstruction with bilateral tissue expanders by Dr. Jens Som       HPI Breast History:   Race: caucasian   Ethnicity: No Ashkenazi Jewish, [place], or Amish ancestry   Age of Menarche:15   LMP: December 2002   Gravida 3 Para 2 Misc 1   Age at first childbirth:21   History of breast feeding: no   History of hormonal contraceptives:yes about 4 years   Age of Menopause: Total hysterectomy at 46 with single oophorectomy. Had other ovary removed with an ectopic long ago. Essentially has no ovaries now.  History of HRT: yes, 3 months premarin   Assisted Reproduction Treatments: no   Hereditary risk factors (pre-breast biopsy): no   Prior history of breast biopsy: 05-10-13   Family history of breast or ovarian cancer: yes, paternal aunt diagnosed with breast cancer age of diagnosis unknown, paternal cousin diagnosed with breast cancer at age 7   Family history of other cancers: yes, maternal aunt diagnosed with colon cancer at age 56, maternal aunt diagnosed with pancreatic cancer at age 78   Patient interested in future childbearing:no   Patient interested in  meeting with a fertility specialist: no   Comments:     has had many sinus surgeryies x3   Has microscopic hematuria chronic  No other bleeding issues.   Short period of HRT few months only.   started Tamoxifen on 10/04/13,      Interval history:     She reports she  Is doing ok. More tired. Does have expected hot flashes.   Mood labile.     Breast cancer stage: IA             Review of Systems   Constitutional: Positive for weight gain, activity change and appetite change. Negative for fever, chills, diaphoresis and fatigue.   HENT: Positive for congestion, postnasal drip and rhinorrhea.    Eyes: Negative for photophobia and visual disturbance.   Respiratory: Negative for cough, chest tightness and shortness of breath.    Cardiovascular: Negative for chest pain, palpitations and leg swelling.   Gastrointestinal: Negative for nausea, vomiting, constipation, blood in stool and abdominal distention.   Genitourinary: Negative for dysuria, hematuria, difficulty urinating and menstrual problem.   Musculoskeletal: Positive for myalgias, back pain, arthralgias and gait problem. Negative for joint swelling.   Skin: Negative for color change, pallor and rash.   Neurological: Positive for headaches (sinus). Negative for dizziness, weakness, light-headedness and numbness.   Hematological: Negative for adenopathy. Does not bruise/bleed easily.  Psychiatric/Behavioral: Positive for depression and dysphoric mood (teraful. h/o depression in past, rx with psychotherapy). Negative for suicidal ideas. The patient is not nervous/anxious.    All other systems reviewed and are negative.      Vitals  Blood pressure 133/73, pulse 71, temperature 97.2 ??F (36.2 ??C), temperature source Oral, resp. rate 18, height 5' 4 (1.626 m), weight 212 lb (96.163 kg).    Physical Exam   Nursing note and vitals reviewed.  Constitutional: She is oriented to person, place, and time. She appears well-developed and well-nourished. No distress.   obese    HENT:   Head: Normocephalic and atraumatic.   Eyes: Conjunctivae and EOM are normal.   Neck: Normal range of motion. Neck supple.   Cardiovascular: Normal rate and regular rhythm.    No murmur heard.  Pulmonary/Chest: Effort normal and breath sounds normal. No respiratory distress. She has no wheezes.   S/p bil skin sparing mastectomies with immediate reconstruction with tissue expanders,     Abdominal: She exhibits no distension and no mass. There is no tenderness. There is no rebound and no guarding.   Musculoskeletal: Normal range of motion. She exhibits no edema or tenderness.   Lymphadenopathy:        Head (right side): No submandibular adenopathy present.        Head (left side): No submandibular adenopathy present.     She has no cervical adenopathy.     She has no axillary adenopathy.   Neurological: She is alert and oriented to person, place, and time. Coordination normal.   Skin: Skin is warm and dry. No rash noted. She is not diaphoretic. No cyanosis or erythema. No pallor. Nails show no clubbing.   Psychiatric: She has a normal mood and affect. Her behavior is normal. Judgment and thought content normal.    Happier today         Neurologic Exam     Mental Status   Oriented to person, place, and time.     Cranial Nerves     CN III, IV, VI   Extraocular motions are normal.       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   1. 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 S/p bil mastectomies with tissue expanders.oncotype score of 12 with approximately 8% chance of recurrence in those who took tamoxifen.   2. Dysphoric mood, ? Depression, frequent headaches                                     1. brca neg.   2.  recommend tamoxifen for 5 yrs till sept 2020.          PLAN:  . Continue on tamoxifen.   OK to start on vaginal estrogen cream to reduce UTI. Use lowest dose possible.  Return 77m  Seeing Dr.Jamie Lewis soon. Exam normal.   Discussed need to start on an exercise regimen daily, as part of medical plan for breast cancer.   Tolerating well, has hot flashes, increased appetite, weight stable.   No vaginal bleeding. S/p TAH.    fosamax -osteoporosis.       Went over [employer] of tamoxifen ( DVT, Cataracts,  ) and signs of same.           Elbie Statzer                             Histories   She has a past medical history of Hyperlipidemia; Kidney stones; Depression; Dyslipidemia; Sleep apnea (06/17/2013); GERD (gastroesophageal reflux disease); Constipation (06/17/2013); Dermatitis; Psoriasis; Cancer (06/2013); and Urinary tract infection.    She has past surgical history that includes Tonsillectomy; Salpingoophorectomy (2004); Sinus surgery; mid urethral sling  (2002); Esophagogastroduodenoscopy (N/A, 05/29/2012); Colonoscopy (N/A, 05/29/2012); Colonoscopy (N/A, 06/27/2012); Hysterectomy (0220); remove breast tissue expanders insertion implants (Bilateral, 06/19/2013); dissection axillary (Right, 06/19/2013); Breast surgery (06/19/13); and remove breast tissue expanders insertion implants (Bilateral, 11/26/2013).    Her family history includes Breast cancer in her other, other, and paternal aunt; COPD in her brother,  father, mother, other, and sister; Coronary artery disease in her father and mother; Diabetes in her mother; Emphysema in her father; Heart attack in her brother; Heart disease in her brother and mother; Heart failure in her father; Hyperlipidemia in her father and mother; Hypertension in her mother; Other in her brother and sister; Psoriasis in her brother and sister. There is no history of Eczema or Melanoma.    She reports that she quit smoking about 24 years ago. She has never used smokeless tobacco. She reports that she does not drink alcohol or use illicit drugs.    Allergies  Crestor    Medications  Current Outpatient Prescriptions   Medication Sig   ??? alendronate Take 1 tablet (70 mg total) by mouth every 7 days.   ??? cetirizine Take 10 mg by mouth daily.   ??? ergocalciferol Take 1 capsule (50,000 Units total) by mouth once a week.   ??? glucosamine-chondroitin Take 1 tablet by mouth 3 times a day.   ??? ibuprofen Take 1 tablet by mouth  every 8 hours as needed   ??? montelukast Take 1 tablet (10 mg total) by mouth at bedtime.   ??? omeprazole Take 1 capsule (40 mg total) by mouth daily.   ??? phenazopyridine Take 200 mg by mouth 3 times a day with meals.   ??? sulfamethoxazole-trimethoprim Take 1 tablet by mouth at bedtime.   ??? SUMAtriptan Take one tablet po daily as needed for migraine, may repeat in 2 hours if needed in 24 hours.   ??? tamoxifen Take 1 tablet (20 mg total) by mouth daily. Indications: HORMONE RECEPTOR POSITIVE BREAST CANCER   ??? ZOVIRAX APPLY TOPICALLY 5 TIMES DAILY AS NEEDED   ??? acetaminophen Take 2 tablets (650 mg total) by mouth every 6 hours as needed for Pain.   ??? atorvastatin Take 0.5 tablets (20 mg total) by mouth daily.   ??? oxybutynin Take 1 tablet (5 mg total) by mouth daily.     No  current facility-administered medications for this visit.         The following portions of the patient's history were reviewed and updated as appropriate: allergies, current medications, past family history, past  medical history, past social history, past surgical history and problem list.    Review of Lab Results  Lab Results   Component Value Date    WBC 7.4 03/31/2014    HGB 12.5 03/31/2014    HCT 36.9 03/31/2014    PLT 223 03/31/2014    CREATININE 0.57* 04/07/2014    BUN 15 04/07/2014    PROT 6.9 04/07/2014    AST 18 04/13/2011    ALT 14 04/07/2014    BILITOT 0.4 04/07/2014    CALCIUM 9.5 04/07/2014    MG 1.9 04/07/2014     Lab Results   Component Value Date    LYMPHSABS 3219 03/31/2014    ALKPHOS 48 04/07/2014     No results found for: PROTIME  Lab Results   Component Value Date    TSH 0.91 04/07/2014     No results found for: SPEP     Imaging   Nm Injection Sentinel Node Wo Imaging    06/21/2013   The patient has right breast cancer and is referred for sentinel lymph node mapping prior to surgery. The patient received 0.55 millicuries of Tc-49m Lymphoseek intradermally. No imaging was performed at the request of Dr. Lucille Passy.     Report Verified by: Lovey Newcomer, M.D. at 06/21/2013 5:31 PM      Investigations R      Medical Decision Making:  The following items were considered in medical decision making:  Review / order clinical lab tests  Review / order radiology tests  Review / order other diagnostic tests/interventions  Reviewed outside records  Medication toxicity monitoring performed

## 2014-08-15 NOTE — Unmapped (Signed)
Continue on tamoxifen.   OK to start on vaginal estrogen cream to reduce UTI. Use lowest dose possible.  Return 44m  Seeing Dr.Jamie Lewis soon. Exam normal.   Discussed need to start on an exercise regimen daily, as part of medical plan for breast cancer.

## 2014-08-15 NOTE — Unmapped (Signed)
-----   Message from Tana Felts, MD PHD sent at 08/15/2014 10:41 AM EDT -----  Good morning;  I am seeing her for further evaluation on the 12th  We can start estrace in the meantime.  Ronen Bromwell, would you please start her on estrace:  Direction: Apply one gram vaginally  twice a week.   Thanks  am    ----- Message -----     From: Tresa Endo, MD     Sent: 08/15/2014  10:23 AM       To: Tana Felts, MD PHD    Hi Dr Ree Shay,   I am seeing Sarah Huang today. She tells me that she has had recurrent UTIs since starting on tamoxifen.   We discussed options , while vaginal estrogen creams are generally not first line for vaginal dryness, given that I suspect she may have more UTI on tamoxifen, I agreed that she could try vaginal estrogen cream at the lowest dose possible to help reduce vaginal dryness and decrease risk of UTI.     Do you think your office can prescribe this for her, I am not sure of the exact dose.     Thank you,  Neetu Radhakrishnan   [facility]

## 2014-08-15 NOTE — Unmapped (Signed)
-----   Message from Ayman Mahdy, MD PHD sent at 08/15/2014 10:41 AM EDT -----  Good morning;  I am seeing her for further evaluation on the 12th  We can start estrace in the meantime.  Sarah Huang, would you please start her on estrace:  Direction: Apply one gram vaginally  twice a week.   Thanks  am    ----- Message -----     From: Neetu Radhakrishnan, MD     Sent: 08/15/2014  10:23 AM       To: Ayman Mahdy, MD PHD    Hi Dr Mahdy,   I am seeing Sarah Huang today. She tells me that she has had recurrent UTIs since starting on tamoxifen.   We discussed options , while vaginal estrogen creams are generally not first line for vaginal dryness, given that I suspect she may have more UTI on tamoxifen, I agreed that she could try vaginal estrogen cream at the lowest dose possible to help reduce vaginal dryness and decrease risk of UTI.     Do you think your office can prescribe this for her, I am not sure of the exact dose.     Thank you,  Neetu Radhakrishnan   [facility]

## 2014-09-16 ENCOUNTER — Encounter: Payer: PRIVATE HEALTH INSURANCE | Attending: Foot & Ankle Surgery

## 2014-10-03 MED ORDER — estradiol (ESTRACE) 0.01 % (0.1 mg/gram) vaginal cream
0.01 | Freq: Every day | VAGINAL | 1.00 refills | 30.00000 days | Status: AC
Start: 2014-10-03 — End: 2014-11-02

## 2014-10-13 MED ORDER — ergocalciferol (VITAMIN D2) 50,000 unit capsule
1250 | ORAL_CAPSULE | ORAL | Status: AC
Start: 2014-10-13 — End: 2015-05-19

## 2014-10-13 MED ORDER — ibuprofen (ADVIL,MOTRIN) 800 MG tablet
800 | ORAL_TABLET | Freq: Three times a day (TID) | ORAL | Status: AC | PRN
Start: 2014-10-13 — End: 2014-12-26

## 2014-10-13 NOTE — Unmapped (Signed)
Last refill 01-20-14 Vit D 50,000 unit  Last refill 08-08-14 Ibuprofen  Last OV 04-07-14  Last phy 04-07-14  Last Vit D check 02/2014 normal range

## 2014-10-30 ENCOUNTER — Ambulatory Visit: Admit: 2014-10-30 | Discharge: 2014-10-30 | Payer: PRIVATE HEALTH INSURANCE

## 2014-10-30 DIAGNOSIS — C50011 Malignant neoplasm of nipple and areola, right female breast: Secondary | ICD-10-CM

## 2014-10-30 MED ORDER — tamoxifen (NOLVADEX) 20 MG tablet
20 | ORAL_TABLET | Freq: Every day | ORAL | 1.00 refills | 30.00000 days | Status: AC
Start: 2014-10-30 — End: 2016-12-15

## 2014-10-30 NOTE — Unmapped (Addendum)
Small streak of bluishness over right breast skin , without tenderness x 6 wks, with some fleeting pains, benign etiology.   1. Continue on tamoxifen  Having hot flashes, tolerable jt pains.   Mood ok.   Continue on tamoxifen. Return in 75m .     cmp in March 2016 normal. Repeat cbc, cmp next visit.

## 2014-10-30 NOTE — Unmapped (Signed)
Chief Complaint   Patient presents with   ??? Follow-up     Breast Cancer          History of Present Illness  Sarah Huang is a 59 y.o. female   Ref: Marjory Sneddon, MD  [address]  [address]  [place], [place] [address]    Dx:  Sarah Huang is a 59 y.o. female who presents due to newly diagnosed right breast cancer. She presented for screening mammography in early April where several changes were noted in both breasts. 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 The left breast lesion turned out to be a cyst which was aspirated and resolved.   She denies and breast changes, symptoms, or other concerns.   Patient is s/p Bilateral skin-sparing    total mastectomies (for R multicentric ductal carcinoma and L prophylactic),   with Right ax SN LN bx on 06/19/13.   and immediate reconstruction with bilateral tissue expanders by Dr. Jens Som       HPI Breast History:   Race: caucasian   Ethnicity: No Ashkenazi Jewish, [place], or Amish ancestry   Age of Menarche:15   LMP: December 2002   Gravida 3 Para 2 Misc 1   Age at first childbirth:21   History of breast feeding: no   History of hormonal contraceptives:yes about 4 years   Age of Menopause: Total hysterectomy at 46 with single oophorectomy. Had other ovary removed with an ectopic long ago. Essentially has no ovaries now.  History of HRT: yes, 3 months premarin   Assisted Reproduction Treatments: no   Hereditary risk factors (pre-breast biopsy): no   Prior history of breast biopsy: 05-10-13   Family history of breast or ovarian cancer: yes, paternal aunt diagnosed with breast cancer age of diagnosis unknown, paternal cousin diagnosed with breast cancer at age 57   Family history of other cancers: yes, maternal aunt diagnosed with colon cancer at age 53, maternal aunt diagnosed with pancreatic cancer at age 67   Patient interested in future childbearing:no   Patient  interested in meeting with a fertility specialist: no   Comments:     has had many sinus surgeryies x3   Has microscopic hematuria chronic  No other bleeding issues.   Short period of HRT few months only.   started Tamoxifen on 10/04/13,      Interval history:     She reports she  Is doing ok. Does have expected hot flashes.   Mood labile.     Breast cancer stage: IA   1. Taking tamoxifen   daily .  Small streak of bluishness over right chest wall skin , without tenderness x 6 wks, with some fleeting pains,- concerned it could be something.  benign etiology.   Having hot flashes, tolerable jt pains.   Mood ok.    Went over [employer] of tamoxifen ( DVT, Cataracts,  ) and signs of same.    No vaginal bleeding. S/p TAH.    fosamax -osteoporosis.              Review of Systems   Constitutional: Negative for fever, chills, diaphoresis, activity change, appetite change and fatigue.   Eyes: Positive for visual disturbance.   Respiratory: Negative for cough, chest tightness and shortness of breath.    Cardiovascular: Negative for chest pain, palpitations and leg swelling.   Gastrointestinal: Positive for abdominal pain and constipation. Negative for nausea,  vomiting, diarrhea, blood in stool and abdominal distention.   Genitourinary: Negative for dysuria, hematuria, difficulty urinating and menstrual problem.   Musculoskeletal: Positive for back pain. Negative for joint swelling and arthralgias.   Skin: Negative for color change, pallor and rash.   Neurological: Negative for dizziness, weakness, light-headedness, numbness and headaches.   Hematological: Negative for adenopathy. Does not bruise/bleed easily.   Psychiatric/Behavioral: Negative for dysphoric mood. The patient is not nervous/anxious.    All other systems reviewed and are negative.      Vitals  Blood pressure 139/78, pulse 73, height 5' 4 (1.626 m), weight 209 lb 12.8 oz (95.165 kg), SpO2 96 %.    Physical Exam   Neurologic Exam    BP 139/78 mmHg   Pulse 73   Ht 5' 4  (1.626 m)   Wt 209 lb 12.8 oz (95.165 kg)   BMI 35.99 kg/m2   SpO2 96%    Physical Exam :    Vitals reviewed.  Pertinent positive findings: obese  S/p bil skin sparing mastectomies with immediate reconstruction with tissue expanders,    Small streak of bluishness over right chest wall skin , without tenderness x 6 wks, with some fleeting pains    Constitutional:  oriented to person, place, and time.  appears well-developed and well-nourished. No distress.   Head: Normocephalic and atraumatic.    Mouth/Throat: Oropharynx is clear and moist.   Eyes: Conjunctivae and EOM are normal.   Neck: Normal range of motion. Neck supple. Thyroid: nl  Cardiovascular: Normal rate, regular rhythm and normal heart sounds.    Pulmonary/Chest: Effort normal. No respiratory distress.    Abdominal: Soft. Bowel sounds normal.  no distension and no mass.  no tenderness.   Musculoskeletal: Normal range of motion.  no edema and no tenderness.   Gait: normal  Lymphadenopathy:  no cervical/ axillary/ inguinal  adenopathy.   Neurological:   alert and oriented to person, place, and time.  Coordination normal. Strength/ Sensations: grossly normal.   Skin: Skin is warm and dry. No rash noted. No erythema.   Psychiatric:   normal mood and affect.   behavior is normal. Judgment and thought content normal.           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   1. 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 S/p bil mastectomies with tissue expanders.oncotype score of 12 with approximately 8% chance of recurrence in those who took tamoxifen.   2. Dysphoric mood, ? Depression, frequent headaches-better now.  1. brca neg.   2.   recommend tamoxifen for 5 yrs till sept 2020.          PLAN:  1. Small streak of bluishness over right breast skin , without tenderness x 6 wks, with some fleeting pains-- benign etiology.  2.  Having hot flashes, tolerable jt pains. Mood ok.   3.  Went over [place] of tamoxifen ( DVT, Cataracts,  ) and signs of same.    4. No vaginal bleeding. S/p TAH.   5.  fosamax -osteoporosis.   6. Continue on tamoxifen. Tolerating well, has hot flashes, increased appetite, weight stable.  Return in 35m   7. cmp in March 2016 normal. Repeat cbc, cmp next visit.   8. Using prn  vaginal estrogen cream to reduce UTI. Use lowest dose possible.  9. Discussed need to start on an exercise regimen daily, as part of medical plan for breast cancer. Continue lifestyle modifications and exercise for weight loss      I personally spent a total of 30 minutes of which 25  minutes was spent in face to face time, including counseling and/or coordination of care with this patient and/or family. (include areas of discussion: treatment options, patient???s progress, side effects, test results, etc.)           Daveah Varone                             Histories   She has a past medical history of Hyperlipidemia; Kidney stones; Depression; Dyslipidemia; Sleep apnea (06/17/2013); GERD (gastroesophageal reflux disease); Constipation (06/17/2013); Dermatitis; Psoriasis; Cancer (06/2013); and Urinary tract infection.    She has past surgical history  that includes Tonsillectomy; Salpingoophorectomy (2004); Sinus surgery; mid urethral sling  (2002); Esophagogastroduodenoscopy (N/A, 05/29/2012); Colonoscopy (N/A, 05/29/2012); Colonoscopy (N/A, 06/27/2012); Hysterectomy (0220); remove breast tissue expanders insertion implants (Bilateral, 06/19/2013); dissection axillary (Right, 06/19/2013); Breast surgery (06/19/13); and remove breast tissue expanders insertion implants (Bilateral, 11/26/2013).    Her family history includes Breast cancer in her other, other, and paternal aunt; COPD in her brother, father, mother, other, and sister; Coronary artery disease in her father and mother; Diabetes in her mother; Emphysema in her father; Heart attack in her brother; Heart disease in her brother and mother; Heart failure in her father; Hyperlipidemia in her father and mother; Hypertension in her mother; Other in her brother and sister; Psoriasis in her brother and sister. There is no history of Eczema or Melanoma.    She reports that she quit smoking about 24 years ago. She has never used smokeless tobacco. She reports that she does not drink alcohol or use illicit drugs.    Allergies  Crestor    Medications  Current Outpatient Prescriptions   Medication Sig   ??? alendronate Take 1 tablet (70 mg total) by mouth every 7 days.   ??? cetirizine Take 10 mg by mouth daily.   ??? ergocalciferol Take 1 capsule (50,000 Units total) by mouth once a week.   ??? estradiol Place 2 g vaginally daily for 30 days.   ??? glucosamine-chondroitin Take 1 tablet by mouth 3 times a day.   ??? ibuprofen Take 1 tablet (800 mg total) by mouth every 8 hours as needed.   ??? montelukast Take 1 tablet (10 mg total) by mouth at bedtime.   ??? omeprazole Take 1 capsule (40 mg total) by mouth daily.   ??? tamoxifen Take 1 tablet (20 mg total)  by mouth daily. Indications: HORMONE RECEPTOR POSITIVE BREAST CANCER   ??? SUMAtriptan Take one tablet po daily as needed for migraine, may repeat in 2 hours if needed in 24 hours.   ???  ZOVIRAX APPLY TOPICALLY 5 TIMES DAILY AS NEEDED     No current facility-administered medications for this visit.         The following portions of the patient's history were reviewed and updated as appropriate: allergies, current medications, past family history, past medical history, past social history, past surgical history and problem list.    Review of Lab Results  Lab Results   Component Value Date    WBC 7.4 03/31/2014    HGB 12.5 03/31/2014    HCT 36.9 03/31/2014    PLT 223 03/31/2014    CREATININE 0.57* 04/07/2014    BUN 15 04/07/2014    PROT 6.9 04/07/2014    AST 18 04/13/2011    ALT 14 04/07/2014    BILITOT 0.4 04/07/2014    CALCIUM 9.5 04/07/2014    MG 1.9 04/07/2014     Lab Results   Component Value Date    LYMPHSABS 3219 03/31/2014    ALKPHOS 48 04/07/2014     No results found for: PROTIME  Lab Results   Component Value Date    TSH 0.91 04/07/2014     No results found for: SPEP     Imaging   Nm Injection Sentinel Node Wo Imaging    06/21/2013   The patient has right breast cancer and is referred for sentinel lymph node mapping prior to surgery. The patient received 0.55 millicuries of Tc-44m Lymphoseek intradermally. No imaging was performed at the request of Dr. Lucille Passy.     Report Verified by: Lovey Newcomer, M.D. at 06/21/2013 5:31 PM      Investigations R      Medical Decision Making:  The following items were considered in medical decision making:  Review / order clinical lab tests  Review / order radiology tests  Review / order other diagnostic tests/interventions  Reviewed outside records  Medication toxicity monitoring performed

## 2014-12-01 MED ORDER — SUMAtriptan (IMITREX) 100 MG tablet
100 | ORAL_TABLET | ORAL | 1.00 refills | 23.00000 days | Status: AC
Start: 2014-12-01 — End: 2016-12-15

## 2014-12-03 MED ORDER — omeprazole (PRILOSEC) 40 MG capsule
40 | ORAL_CAPSULE | ORAL | Status: AC
Start: 2014-12-03 — End: 2015-12-22

## 2014-12-03 NOTE — Unmapped (Signed)
Last refill 01-20-14  Last OV 04-07-14  Last phy 04-07-14

## 2014-12-26 MED ORDER — ibuprofen (ADVIL,MOTRIN) 800 MG tablet
800 | ORAL_TABLET | ORAL | Status: AC
Start: 2014-12-26 — End: 2015-03-04

## 2014-12-26 NOTE — Unmapped (Signed)
Last refill 11-07-14  Last phy 04-07-14

## 2015-02-04 NOTE — Unmapped (Signed)
Last refill 01-20-14  Last ov and phy 04-07-14  Dexa ordered not done yet

## 2015-02-05 MED ORDER — alendronate (FOSAMAX) 70 MG tablet
70 | ORAL_TABLET | ORAL | 1.00 refills | 90.00000 days | Status: AC
Start: 2015-02-05 — End: 2015-04-14

## 2015-02-13 ENCOUNTER — Ambulatory Visit: Payer: PRIVATE HEALTH INSURANCE

## 2015-03-06 MED ORDER — ibuprofen (ADVIL,MOTRIN) 800 MG tablet
800 | ORAL_TABLET | ORAL | Status: AC
Start: 2015-03-06 — End: 2015-04-14

## 2015-03-06 NOTE — Unmapped (Signed)
Pt last office visit was with Dr. Gerarda Gunther on 04/07/14  Last refill was on 12/26/14

## 2015-04-14 ENCOUNTER — Ambulatory Visit: Admit: 2015-04-14 | Payer: PRIVATE HEALTH INSURANCE

## 2015-04-14 ENCOUNTER — Other Ambulatory Visit: Admit: 2015-04-14 | Payer: PRIVATE HEALTH INSURANCE

## 2015-04-14 ENCOUNTER — Ambulatory Visit: Admit: 2015-04-14 | Discharge: 2015-04-14 | Payer: PRIVATE HEALTH INSURANCE

## 2015-04-14 DIAGNOSIS — E7800 Pure hypercholesterolemia, unspecified: Secondary | ICD-10-CM

## 2015-04-14 DIAGNOSIS — R3589 Other polyuria: Secondary | ICD-10-CM

## 2015-04-14 DIAGNOSIS — Z0000 Encounter for general adult medical examination without abnormal findings: Secondary | ICD-10-CM

## 2015-04-14 LAB — DIFFERENTIAL
Basophils Absolute: 45 /uL (ref 0–200)
Basophils Relative: 0.7 % (ref 0.0–1.0)
Eosinophils Absolute: 134 /uL (ref 15–500)
Eosinophils Relative: 2.1 % (ref 0.0–8.0)
Lymphocytes Absolute: 2899 /uL (ref 850–3900)
Lymphocytes Relative: 45.3 % (ref 15.0–45.0)
Monocytes Absolute: 390 /uL (ref 200–950)
Monocytes Relative: 6.1 % (ref 0.0–12.0)
Neutrophils Absolute: 2931 /uL (ref 1500–7800)
Neutrophils Relative: 45.8 % (ref 40.0–80.0)
nRBC: 0 /100 WBC (ref 0–0)

## 2015-04-14 LAB — CBC
Hematocrit: 39.3 % (ref 35.0–45.0)
Hemoglobin: 13.1 g/dL (ref 11.7–15.5)
MCH: 29.9 pg (ref 27.0–33.0)
MCHC: 33.3 g/dL (ref 32.0–36.0)
MCV: 89.7 fL (ref 80.0–100.0)
MPV: 7.9 fL (ref 7.5–11.5)
Platelets: 265 10*3/uL (ref 140–400)
RBC: 4.38 10*6/uL (ref 3.80–5.10)
RDW: 12.9 % (ref 11.0–15.0)
WBC: 6.4 10*3/uL (ref 3.8–10.8)

## 2015-04-14 LAB — LIPID PANEL
Cholesterol, Total: 244 mg/dL (ref 0–200)
HDL: 48 mg/dL (ref 60–92)
LDL Cholesterol: 157 mg/dL
Triglycerides: 195 mg/dL (ref 10–149)

## 2015-04-14 LAB — LIPASE: Lipase: 16 U/L (ref 4–82)

## 2015-04-14 LAB — COMPREHENSIVE METABOLIC PANEL, SERUM
ALT: 23 U/L (ref 7–52)
AST (SGOT): 17 U/L (ref 13–39)
Albumin: 4.6 g/dL (ref 3.5–5.7)
Alkaline Phosphatase: 62 U/L (ref 36–125)
Anion Gap: 9 mmol/L (ref 3–16)
BUN: 17 mg/dL (ref 7–25)
CO2: 30 mmol/L (ref 21–33)
Calcium: 9.8 mg/dL (ref 8.6–10.3)
Chloride: 103 mmol/L (ref 98–110)
Creatinine: 0.62 mg/dL (ref 0.60–1.30)
Glucose: 95 mg/dL (ref 70–100)
Osmolality, Calculated: 295 mOsm/kg (ref 278–305)
Potassium: 4.4 mmol/L (ref 3.5–5.3)
Sodium: 142 mmol/L (ref 133–146)
Total Bilirubin: 0.3 mg/dL (ref 0.0–1.5)
Total Protein: 7.2 g/dL (ref 6.4–8.9)
eGFR AA CKD-EPI: >90 See note.
eGFR NONAA CKD-EPI: >90 See note.

## 2015-04-14 LAB — POCT URINALYSIS DIPSTICK, NONAUTOMATED; W/O MICRO
POCT - Bilirubin, UA: NEGATIVE
POCT - Glucose, UA: NEGATIVE
POCT - Ketones, UA: NEGATIVE
POCT - Nitrite, UA: NEGATIVE
POCT - Specific Gravity, Urine: 1.015
POCT - Urobilinogen, UA: NEGATIVE
POCT - pH, UA: 5

## 2015-04-14 LAB — AMYLASE: Amylase: 32 U/L (ref 16–117)

## 2015-04-14 LAB — URINE CULTURE: Culture Result: <10000

## 2015-04-14 LAB — HIV 1+2 ANTIBODY/ANTIGEN WITH REFLEX: HIV 1+2 AB/AGN: NONREACTIVE

## 2015-04-14 LAB — MAGNESIUM: Magnesium: 1.8 mg/dL (ref 1.5–2.5)

## 2015-04-14 LAB — HEMOGLOBIN A1C: Hemoglobin A1C: 5.7 % (ref 4.8–6.4)

## 2015-04-14 LAB — VITAMIN D 25 HYDROXY: Vit D, 25-Hydroxy: 39.7 ng/mL (ref 30.0–100)

## 2015-04-14 LAB — HEPATITIS C ANTIBODY
HCV Ab: NONREACTIVE
HCVAB Number: 0.15 S/CO (ref 0.00–0.79)

## 2015-04-14 LAB — TSH: TSH: 1.75 u[IU]/mL (ref 0.34–5.60)

## 2015-04-14 MED ORDER — alendronate (FOSAMAX) 70 MG tablet
70 | ORAL_TABLET | ORAL | 1.00 refills | 90.00000 days | Status: AC
Start: 2015-04-14 — End: 2016-12-15

## 2015-04-14 MED ORDER — acyclovir (ZOVIRAX) 5 % cream
5 | TOPICAL | Status: AC
Start: 2015-04-14 — End: 2015-08-01

## 2015-04-14 MED ORDER — ibuprofen (ADVIL,MOTRIN) 800 MG tablet
800 | ORAL_TABLET | Freq: Three times a day (TID) | ORAL | Status: AC | PRN
Start: 2015-04-14 — End: 2015-05-12

## 2015-04-14 NOTE — Unmapped (Signed)
Subjective  HPI:   Patient ID: Sarah Huang is a 60 y.o. female.    Chief Complaint:  HPI     Here for annual exam.    Had episode of abd pain, nausea, then vomiting on evening. Had eaten skyline about 8:30, then pain started in middle of night.  Vomiting in am.  No symptoms currentlyl, happened two nights ago.    Stays hungry all the time.  Very tired, sleeps a lot when off work.  Nocturia four times a night.  No excessive thirst.  No heartburn, no fever.    Stopped tamoxifen due to concern about weight gain.    Unable to take statin due to muscle cramping.    Wt Readings from Last 3 Encounters:   04/14/15 212 lb (96.163 kg)   10/30/14 209 lb 12.8 oz (95.165 kg)   08/15/14 212 lb (96.163 kg)         Medications:  Current Outpatient Prescriptions   Medication Sig Dispense Refill   ??? alendronate (FOSAMAX) 70 MG tablet Take 1 tablet by mouth  every 7 days 12 tablet 3   ??? cetirizine (ZYRTEC) 10 MG tablet Take 10 mg by mouth daily.     ??? ergocalciferol (VITAMIN D2) 50,000 unit capsule Take 1 capsule (50,000 Units total) by mouth once a week. 12 capsule 1   ??? ibuprofen (ADVIL,MOTRIN) 800 MG tablet Take 1 tablet by mouth  every 8 hours as needed 180 tablet 0   ??? omeprazole (PRILOSEC) 40 MG capsule Take 1 capsule by mouth  daily 90 capsule 3   ??? SUMAtriptan (IMITREX) 100 MG tablet Take one tablet po daily as needed for migraine, may repeat in 2 hours if needed in 24 hours. 12 tablet 2   ??? ZOVIRAX 5 % cream APPLY TOPICALLY 5 TIMES DAILY AS NEEDED 15 g 0   ??? glucosamine-chondroitin 500-400 mg tablet Take 1 tablet by mouth 3 times a day.     ??? montelukast (SINGULAIR) 10 mg tablet Take 1 tablet (10 mg total) by mouth at bedtime. 90 tablet 3   ??? tamoxifen (NOLVADEX) 20 MG tablet Take 1 tablet (20 mg total) by mouth daily. Indications: HORMONE RECEPTOR POSITIVE BREAST CANCER 30 tablet 6     No current facility-administered medications for this visit.        ROS:   Review of Systems   Constitutional: Negative for fever and  chills.   HENT: Negative for congestion and rhinorrhea.    Eyes: Positive for visual disturbance (blurry vision, no eye exam).   Respiratory: Negative for cough, shortness of breath and wheezing.    Cardiovascular: Positive for chest pain (one episode).   Gastrointestinal: Positive for nausea, vomiting and abdominal pain. Negative for diarrhea and constipation.   Genitourinary: Positive for frequency and nocturia. Negative for dysuria and flank pain.   Musculoskeletal:        Legs feel tight.   Skin: Negative for rash.   Neurological: Positive for headaches (allergies, uses imitrex.). Negative for dizziness and light-headedness.          Objective:   Physical Exam   Vitals reviewed.  Constitutional: She is oriented to person, place, and time. She appears well-developed and well-nourished. No distress.   HENT:   Head: Normocephalic and atraumatic.   Right Ear: Hearing, tympanic membrane, external ear and ear canal normal.   Left Ear: Hearing, tympanic membrane, external ear and ear canal normal.   Nose: Nose normal.   Mouth/Throat: Oropharynx is  clear and moist and mucous membranes are normal. No oropharyngeal exudate.   Eyes: Conjunctivae and EOM are normal. Pupils are equal, round, and reactive to light. Right eye exhibits no discharge. Left eye exhibits no discharge. No scleral icterus.   Neck: Normal range of motion. Neck supple. No thyromegaly present.   Cardiovascular: Normal rate, regular rhythm, normal heart sounds and intact distal pulses.  Exam reveals no gallop and no friction rub.    No murmur heard.  Pulmonary/Chest: Effort normal and breath sounds normal. She has no wheezes. She has no rales.   Abdominal: Bowel sounds are normal. She exhibits no distension and no mass. There is no hepatosplenomegaly. There is no tenderness. There is no rebound and no guarding.   Genitourinary:   Bilat breast reconstruction.   Musculoskeletal: Normal range of motion. She exhibits no edema.   Lymphadenopathy:     She has  no cervical adenopathy.   Neurological: She is alert and oriented to person, place, and time. She has normal reflexes.   Skin: Skin is warm and dry. No rash noted. She is not diaphoretic. No erythema.   Psychiatric: She has a normal mood and affect. Her behavior is normal. Judgment and thought content normal.             Filed Vitals:    04/14/15 0817   BP: 108/78   Pulse: 68   Height: 5' 3 (1.6 m)   Weight: 212 lb (96.163 kg)     Body mass index is 37.56 kg/(m^2).  Body surface area is 2.07 meters squared.                Assessment/Plan:     Patient Active Problem List   Diagnosis   ??? Esophageal reflux   ??? Elevated blood pressure reading without diagnosis of hypertension   ??? Calculus of kidney   ??? Plantar fascial fibromatosis   ??? Insomnia   ??? Family history of diabetes mellitus   ??? Osteoporosis- due for DEXA, ordered.   ??? Postablative ovarian failure   ??? Fatty liver- check LFT's.   ??? Weight gain   ??? Hypercholesteremia- pt intolerant of statins. Check lipid panel.   ??? Urinary retention with incomplete bladder emptying   ??? Hearing loss   ??? Trochanteric bursitis of left hip   ??? SUI (stress urinary incontinence, female)   ??? Post-void dribbling   ??? Microscopic hematuria   ??? Fibromyalgia   ??? Malignant neoplasm of right female breast- followed by hem onc.   ??? Family history of cancer   ??? Family history of breast cancer   ??? S/P bilateral mastectomy   ??? Breast cancer, female   ??? Acquired absence of both breasts and nipples   ??? Chronic back pain   ??? Use of tamoxifen (Nolvadex)     Episode nausea, vomiting, abd pain, diarrhea - concern about gallbladder pathology. Will check labs and obtain RUQ ultrasound.    Fatigue- check labs. Concern re diabetes with polyuria, fatigue, blurry vision. Check a1c.    Abn urinalysis - check urine culture.    Preventative care - up to date immunizations and screening.    RTC one year or prn.

## 2015-04-15 ENCOUNTER — Inpatient Hospital Stay: Admit: 2015-04-15 | Payer: PRIVATE HEALTH INSURANCE

## 2015-04-15 DIAGNOSIS — R1011 Right upper quadrant pain: Secondary | ICD-10-CM

## 2015-04-16 NOTE — Unmapped (Signed)
Called pt with test results  Fatty liver on abd ultrasound, no gallstones.  Labs nml except elevated chol.  LM, will try to call again Monday (will be out of the office Fri)

## 2015-05-11 ENCOUNTER — Inpatient Hospital Stay: Admit: 2015-05-11 | Payer: PRIVATE HEALTH INSURANCE

## 2015-05-11 DIAGNOSIS — M810 Age-related osteoporosis without current pathological fracture: Secondary | ICD-10-CM

## 2015-05-13 MED ORDER — ibuprofen (ADVIL,MOTRIN) 800 MG tablet
800 | ORAL_TABLET | ORAL | Status: AC
Start: 2015-05-13 — End: 2015-09-19

## 2015-05-13 NOTE — Unmapped (Signed)
Last refill 04-14-15  Last OV and phy 04-14-15

## 2015-05-14 NOTE — Unmapped (Signed)
Called pt to discuss further workup for fatty liver.  LM on VM.  Will call again Monday.    Anti smooth muscle

## 2015-05-19 MED ORDER — ergocalciferol (VITAMIN D2) 50,000 unit capsule
1250 | ORAL_CAPSULE | ORAL | Status: AC
Start: 2015-05-19 — End: 2016-12-15

## 2015-05-19 NOTE — Unmapped (Signed)
Patient is ret'ing MD's call.

## 2015-05-19 NOTE — Unmapped (Signed)
Talked with pt, she will come in for labs this week.  Orders in chart.

## 2015-05-19 NOTE — Unmapped (Signed)
Talked with pt, see phone note 04/16/15.

## 2015-05-26 ENCOUNTER — Other Ambulatory Visit: Admit: 2015-05-26 | Payer: PRIVATE HEALTH INSURANCE

## 2015-05-26 DIAGNOSIS — K760 Fatty (change of) liver, not elsewhere classified: Secondary | ICD-10-CM

## 2015-05-26 LAB — HEPATITIS A ANTIBODY TOTAL: Hep A IgG: REACTIVE

## 2015-05-26 LAB — LIVER-KIDNEY MICROSOME (LKM-1) ANTIBODY (IGG): Liver-Kidney Microsomal Ab: 1.2 Units (ref 0.0–20.0)

## 2015-05-26 LAB — ANA (IFA) WITH TITER: ANA Titer by IFA: NEGATIVE

## 2015-05-26 LAB — IRON STUDIES
% Iron Saturation: 22.5 % (ref 15.0–55.0)
Iron: 75 ug/dL (ref 50–212)
TIBC: 333 ug/dL (ref 265–497)

## 2015-05-26 LAB — HEPATITIS B SURFACE ANTIBODY, QUANTITATIVE
HBSAB NUMBER: 156.89 m[IU]/mL — ABNORMAL HIGH (ref 0.00–7.99)
Hep B S Ab: REACTIVE — AB

## 2015-05-26 LAB — FERRITIN: Ferritin: 33.6 ng/mL (ref 11.0–306.8)

## 2015-05-26 LAB — HEPATITIS B CORE ANTIBODY: Hep B Core Total Ab: NONREACTIVE

## 2015-05-26 LAB — F-ACTIN SMOOTH MUSCLE AB: F-Actin Smooth Muscle IgG: 10 Units (ref 0–19)

## 2015-05-26 LAB — HEPATITIS B SURFACE ANTIGEN: Hep B Surface Ag: NONREACTIVE

## 2015-08-04 MED ORDER — ZOVIRAX 5 % cream
5 | TOPICAL | Status: AC
Start: 2015-08-04 — End: 2016-12-15

## 2015-08-04 NOTE — Unmapped (Signed)
Last refill 04-14-15  Last OV and phy 04-14-15

## 2015-09-04 ENCOUNTER — Ambulatory Visit: Admit: 2015-09-04 | Discharge: 2015-09-04 | Payer: PRIVATE HEALTH INSURANCE

## 2015-09-04 ENCOUNTER — Ambulatory Visit: Admit: 2015-09-04 | Payer: PRIVATE HEALTH INSURANCE

## 2015-09-04 DIAGNOSIS — N3001 Acute cystitis with hematuria: Secondary | ICD-10-CM

## 2015-09-04 LAB — URINE CULTURE

## 2015-09-04 LAB — POCT URINALYSIS DIPSTICK, NONAUTOMATED; W/O MICRO
POCT - Bilirubin, UA: NEGATIVE
POCT - Glucose, UA: NEGATIVE
POCT - Ketones, UA: NEGATIVE
POCT - Nitrite, UA: POSITIVE
POCT - Specific Gravity, Urine: 1.015
POCT - Urobilinogen, UA: NEGATIVE
POCT - pH, UA: 6

## 2015-09-04 MED ORDER — nitrofurantoin, macrocrystal-monohydrate, (MACROBID) 100 MG capsule
100 | ORAL_CAPSULE | Freq: Two times a day (BID) | ORAL | Status: AC
Start: 2015-09-04 — End: 2016-12-15

## 2015-09-04 NOTE — Unmapped (Signed)
Will start Macrobid, send urine for culture and refer to urology.     Risks, benefits, indications and instructions have been discussed with pt.   Patient understands and agrees with plan.

## 2015-09-04 NOTE — Unmapped (Signed)
[facility]  [facility]  [address] [address]    Name:  Sarah Huang Date of Birth: 1955/08/19 (60 y.o.)   MRN: [id number]    Date of Service:  09/04/2015    Subjective  Chief Complaint:     Chief Complaint   Patient presents with   ??? Urinary Tract Infection     Frequency Urgency with urination, Dyruia and nausea       History of Present Illness:   Sarah Huang is a(n) 60 y.o. female here today for the following:     HPI Comments:   Pt. here today for an urgent visit. Med, problem and allergy list updated today.    P.t here for UTI sx.  For past 2 days.  UA is postive.  This is 4th UTI in past 18 months.  Has hx of chrnoic hematuria and kidney stones.  Also had sling procedure of bladder.  Will refer to urology in addition to treating UTi today. No fever or chills.     Urinary Tract Infection         Current Outpatient Medications:  Current Outpatient Prescriptions   Medication Sig Dispense Refill   ??? alendronate (FOSAMAX) 70 MG tablet Take 1 tablet (70 mg total) by mouth every 7 days. 12 tablet 3   ??? cetirizine (ZYRTEC) 10 MG tablet Take 10 mg by mouth daily.     ??? ergocalciferol (VITAMIN D2) 50,000 unit capsule Take 1 capsule (50,000 Units total) by mouth once a week. 12 capsule 1   ??? ibuprofen (ADVIL,MOTRIN) 800 MG tablet Take 1 tablet by mouth  every 8 hours as needed for pain 180 tablet 1   ??? omeprazole (PRILOSEC) 40 MG capsule Take 1 capsule by mouth  daily 90 capsule 3   ??? SUMAtriptan (IMITREX) 100 MG tablet Take one tablet po daily as needed for migraine, may repeat in 2 hours if needed in 24 hours. 12 tablet 2   ??? ZOVIRAX 5 % cream Apply topically to affected area(s) five times a day as needed 15 g 3   ??? tamoxifen (NOLVADEX) 20 MG tablet Take 1 tablet (20 mg total) by mouth daily. Indications: HORMONE RECEPTOR POSITIVE BREAST CANCER 30 tablet 6     No current facility-administered medications for this visit.         ROS:   Review of Systems    All other systems reviewed and are negative.         Objective:     Filed Vitals:    09/04/15 1135   BP: 146/100   Pulse: 68   Weight: 215 lb (97.523 kg)     Body mass index is 38.09 kg/(m^2).    Physical Exam   Nursing note and vitals reviewed.  Constitutional: She appears well-developed and well-nourished.   Abdominal: Soft. Bowel sounds are normal. She exhibits no distension. There is no tenderness. There is no rebound.   No CVA tenderness             Assessment/Plan:   There are no diagnoses linked to this encounter.  No Follow-up on file.       Fortino Sic, MD

## 2015-09-21 MED ORDER — ibuprofen (ADVIL,MOTRIN) 800 MG tablet
800 | ORAL_TABLET | ORAL | 1 refills | Status: AC
Start: 2015-09-21 — End: 2017-12-21

## 2015-09-21 NOTE — Telephone Encounter (Signed)
Last refill 05-13-15  Last OV 09-04-15  Last phy 04-14-15

## 2015-12-22 MED ORDER — omeprazole (PRILOSEC) 40 MG capsule
40 | ORAL_CAPSULE | ORAL | 3 refills | Status: AC
Start: 2015-12-22 — End: 2016-12-15

## 2015-12-22 NOTE — Telephone Encounter (Signed)
lov 04/14/15 phys

## 2016-05-19 ENCOUNTER — Ambulatory Visit: Admit: 2016-05-19 | Discharge: 2016-05-19 | Payer: PRIVATE HEALTH INSURANCE

## 2016-05-19 ENCOUNTER — Other Ambulatory Visit: Admit: 2016-05-19 | Payer: PRIVATE HEALTH INSURANCE

## 2016-05-19 DIAGNOSIS — Z0000 Encounter for general adult medical examination without abnormal findings: Secondary | ICD-10-CM

## 2016-05-19 DIAGNOSIS — Z13220 Encounter for screening for lipoid disorders: Secondary | ICD-10-CM

## 2016-05-19 LAB — COMPREHENSIVE METABOLIC PANEL, SERUM
ALT: 13 U/L (ref 7–52)
AST (SGOT): 18 U/L (ref 13–39)
Albumin: 4.5 g/dL (ref 3.5–5.7)
Alkaline Phosphatase: 77 U/L (ref 36–125)
Anion Gap: 8 mmol/L (ref 3–16)
BUN: 9 mg/dL (ref 7–25)
CO2: 25 mmol/L (ref 21–33)
Calcium: 9.7 mg/dL (ref 8.6–10.3)
Chloride: 108 mmol/L (ref 98–110)
Creatinine: 0.64 mg/dL (ref 0.60–1.30)
Glucose: 84 mg/dL (ref 70–100)
Osmolality, Calculated: 290 mOsm/kg (ref 278–305)
Potassium: 4.5 mmol/L (ref 3.5–5.3)
Sodium: 141 mmol/L (ref 133–146)
Total Bilirubin: 0.4 mg/dL (ref 0.0–1.5)
Total Protein: 7.1 g/dL (ref 6.4–8.9)
eGFR AA CKD-EPI: >90 See note.
eGFR NONAA CKD-EPI: >90 See note.

## 2016-05-19 LAB — LIPID PANEL
Cholesterol, Total: 228 mg/dL (ref 0–200)
HDL: 48 mg/dL (ref 60–92)
LDL Cholesterol: 141 mg/dL
Triglycerides: 193 mg/dL (ref 10–149)

## 2016-05-19 LAB — CBC
Hematocrit: 39.4 % (ref 35.0–45.0)
Hemoglobin: 13.2 g/dL (ref 11.7–15.5)
MCH: 30.6 pg (ref 27.0–33.0)
MCHC: 33.6 g/dL (ref 32.0–36.0)
MCV: 91.2 fL (ref 80.0–100.0)
MPV: 7.3 fL (ref 7.5–11.5)
Platelets: 278 10*3/uL (ref 140–400)
RBC: 4.32 10*6/uL (ref 3.80–5.10)
RDW: 12.5 % (ref 11.0–15.0)
WBC: 8 10*3/uL (ref 3.8–10.8)

## 2016-05-19 LAB — DIFFERENTIAL
Basophils Absolute: 48 /uL (ref 0–200)
Basophils Relative: 0.6 % (ref 0.0–1.0)
Eosinophils Absolute: 136 /uL (ref 15–500)
Eosinophils Relative: 1.7 % (ref 0.0–8.0)
Lymphocytes Absolute: 2632 /uL (ref 850–3900)
Lymphocytes Relative: 32.9 % (ref 15.0–45.0)
Monocytes Absolute: 592 /uL (ref 200–950)
Monocytes Relative: 7.4 % (ref 0.0–12.0)
Neutrophils Absolute: 4592 /uL (ref 1500–7800)
Neutrophils Relative: 57.4 % (ref 40.0–80.0)
nRBC: 0 /100 WBC (ref 0–0)

## 2016-05-19 LAB — VITAMIN B12: Vitamin B-12: 209 pg/mL (ref 180–914)

## 2016-05-19 LAB — FOLATE: Folic Acid: 16.8 ng/mL (ref 5.90–24.80)

## 2016-05-19 LAB — HEMOGLOBIN A1C: Hemoglobin A1C: 5.2 % (ref 4.8–6.4)

## 2016-05-19 NOTE — Progress Notes (Signed)
[facility]  [facility]  [address] [place] [address]    Name:  Sarah Huang Date of Birth: 1955-11-21 (61 y.o.)   MRN: [id number]    Date of Service:  05/19/2016     Subjective:     Chief Complaint   Patient presents with    Annual Exam     Pt is fasting     History of Present Illness:  Sarah Huang is a(n) 61 y.o. female here today for the following:   Here for annual exam.  Now on plant based diet, no meat or dairy, since Dec.  Has lost >30 lb    Has been off fosamax for 8 months.    Stopped tamoxifen.  Stopped seeing hem onc, decided she didn't want to see them.    Feels well.  Going to start exercising.  Her daughter is helping her eat healthy and start exercising.    Wt Readings from Last 3 Encounters:   05/19/16 186 lb (84.4 kg)   09/04/15 215 lb (97.5 kg)   04/14/15 212 lb (96.2 kg)       HPI    Current Outpatient Prescriptions   Medication Sig Dispense Refill    omeprazole (PRILOSEC) 40 MG capsule TAKE 1 CAPSULE BY MOUTH  DAILY 90 capsule 3    alendronate (FOSAMAX) 70 MG tablet Take 1 tablet (70 mg total) by mouth every 7 days. 12 tablet 3    cetirizine (ZYRTEC) 10 MG tablet Take 10 mg by mouth daily.      ergocalciferol (VITAMIN D2) 50,000 unit capsule Take 1 capsule (50,000 Units total) by mouth once a week. 12 capsule 1    ibuprofen (ADVIL,MOTRIN) 800 MG tablet Take 1 tablet by mouth  every 8 hours as needed for pain 270 tablet 1    nitrofurantoin, macrocrystal-monohydrate, (MACROBID) 100 MG capsule Take 1 capsule (100 mg total) by mouth 2 times a day. 14 capsule 0    SUMAtriptan (IMITREX) 100 MG tablet Take one tablet po daily as needed for migraine, may repeat in 2 hours if needed in 24 hours. 12 tablet 2    tamoxifen (NOLVADEX) 20 MG tablet Take 1 tablet (20 mg total) by mouth daily. Indications: HORMONE RECEPTOR POSITIVE BREAST CANCER 30 tablet 6    ZOVIRAX 5 % cream Apply topically to affected area(s) five times a day as needed 15  g 3     No current facility-administered medications for this visit.       Review of Systems   Constitutional: Positive for weight loss (intentional). Negative for chills and fever.   HENT: Negative for congestion and rhinorrhea.    Eyes: Negative for visual disturbance.   Respiratory: Negative for cough, shortness of breath and wheezing.    Cardiovascular: Negative for chest pain and palpitations.   Gastrointestinal: Negative for abdominal pain, constipation, diarrhea, nausea and vomiting.   Genitourinary: Positive for difficulty urinating. Negative for dysuria, hematuria, vaginal bleeding and vaginal discharge.   Musculoskeletal: Negative for arthralgias.   Skin: Negative for rash.   Neurological: Negative for dizziness, light-headedness and headaches.   All other systems reviewed and are negative.           Objective:     Vitals:    05/19/16 0951   BP: (!) 128/92   BP Location: Left arm   Patient Position: Sitting   BP Cuff Size: Regular   Pulse: 88   Weight: 186 lb (  84.4 kg)   Height: 5' 2.25 (1.581 m)     Body mass index is 33.75 kg/m.    Physical Exam   Vitals reviewed.  Constitutional: She is oriented to person, place, and time. She appears well-developed and well-nourished. No distress.   HENT:   Head: Normocephalic and atraumatic.   Right Ear: Hearing, tympanic membrane, external ear and ear canal normal.   Left Ear: Hearing, tympanic membrane, external ear and ear canal normal.   Nose: Nose normal.   Mouth/Throat: Oropharynx is clear and moist and mucous membranes are normal. No oropharyngeal exudate.   Eyes: Conjunctivae and EOM are normal. Pupils are equal, round, and reactive to light. Right eye exhibits no discharge. Left eye exhibits no discharge. No scleral icterus.   Neck: Normal range of motion. Neck supple. No thyromegaly present.   Cardiovascular: Normal rate, regular rhythm, normal heart sounds and intact distal pulses.  Exam reveals no gallop and no friction rub.    No murmur  heard.  Pulmonary/Chest: Effort normal and breath sounds normal. She has no wheezes. She has no rales.   Abdominal: Bowel sounds are normal. She exhibits no distension and no mass. There is no hepatosplenomegaly. There is no tenderness. There is no rebound and no guarding.   Genitourinary:   Genitourinary Comments: Breasts without masses. s/p bilat mastectomy.   Musculoskeletal: Normal range of motion. She exhibits no edema.   Lymphadenopathy:     She has no cervical adenopathy.   Neurological: She is alert and oriented to person, place, and time. She has normal reflexes.   Skin: Skin is warm and dry. No rash noted. She is not diaphoretic. No erythema.   Psychiatric: She has a normal mood and affect. Her behavior is normal. Judgment and thought content normal.            Assessment/Plan:     Patient Active Problem List   Diagnosis    Esophageal reflux    Elevated blood pressure reading without diagnosis of hypertension    Calculus of kidney    Plantar fascial fibromatosis    Insomnia    Family history of diabetes mellitus    Osteoporosis    Postablative ovarian failure    Fatty liver- check LFTs.    Weight gain    Hypercholesteremia    Urinary retention with incomplete bladder emptying    Hearing loss    Trochanteric bursitis of left hip    SUI (stress urinary incontinence, female)    Post-void dribbling    Microscopic hematuria    Fibromyalgia    Malignant neoplasm of right female breast (CMS Dx)    Family history of cancer    Family history of breast cancer    S/P bilateral mastectomy    Breast cancer, female (CMS Dx)    Acquired absence of both breasts and nipples    Chronic back pain    Use of tamoxifen (Nolvadex)    Migraine without aura and without status migrainosus, not intractable         Osteoporosis - off fosamax , last DEXA showed improvement , will have stay off and repeat next year.    Preventative care - up to date immunizations and screening.  Check labs.

## 2016-12-14 NOTE — Unmapped (Signed)
Can add her on at 3:00 tomorrow.

## 2016-12-14 NOTE — Unmapped (Signed)
Pt c/o stomach pain for 2 wks.    No vomiting, or diarrhea, just pain.    Pt is asking if you can add her in w/in the next wk.  She does not want to wait till Jan.

## 2016-12-14 NOTE — Unmapped (Signed)
Pt ret'd call / appt sched'd.

## 2016-12-14 NOTE — Telephone Encounter (Signed)
Returned the call to pt NA LM to call back to sch an appt.

## 2016-12-15 ENCOUNTER — Other Ambulatory Visit: Admit: 2016-12-15 | Payer: PRIVATE HEALTH INSURANCE

## 2016-12-15 ENCOUNTER — Ambulatory Visit: Admit: 2016-12-15 | Discharge: 2016-12-15 | Payer: PRIVATE HEALTH INSURANCE

## 2016-12-15 DIAGNOSIS — R1013 Epigastric pain: Secondary | ICD-10-CM

## 2016-12-15 DIAGNOSIS — E559 Vitamin D deficiency, unspecified: Secondary | ICD-10-CM

## 2016-12-15 LAB — CBC
Hematocrit: 38.8 % (ref 35.0–45.0)
Hemoglobin: 13.5 g/dL (ref 11.7–15.5)
MCH: 30.8 pg (ref 27.0–33.0)
MCHC: 34.9 g/dL (ref 32.0–36.0)
MCV: 88.2 fL (ref 80.0–100.0)
MPV: 8.3 fL (ref 7.5–11.5)
Platelets: 269 10*3/uL (ref 140–400)
RBC: 4.4 10*6/uL (ref 3.80–5.10)
RDW: 12.4 % (ref 11.0–15.0)
WBC: 8.3 10*3/uL (ref 3.8–10.8)

## 2016-12-15 LAB — COMPREHENSIVE METABOLIC PANEL, SERUM
ALT: 14 U/L (ref 7–52)
AST (SGOT): 16 U/L (ref 13–39)
Albumin: 4.5 g/dL (ref 3.5–5.7)
Alkaline Phosphatase: 91 U/L (ref 36–125)
Anion Gap: 9 mmol/L (ref 3–16)
BUN: 18 mg/dL (ref 7–25)
CO2: 27 mmol/L (ref 21–33)
Calcium: 9.5 mg/dL (ref 8.6–10.3)
Chloride: 105 mmol/L (ref 98–110)
Creatinine: 0.54 mg/dL (ref 0.60–1.30)
Glucose: 84 mg/dL (ref 70–100)
Osmolality, Calculated: 293 mOsm/kg (ref 278–305)
Potassium: 4.3 mmol/L (ref 3.5–5.3)
Sodium: 141 mmol/L (ref 133–146)
Total Bilirubin: 0.3 mg/dL (ref 0.0–1.5)
Total Protein: 7.2 g/dL (ref 6.4–8.9)
eGFR AA CKD-EPI: >90 See note.
eGFR NONAA CKD-EPI: >90 See note.

## 2016-12-15 LAB — DIFFERENTIAL
Basophils Absolute: 50 /uL (ref 0–200)
Basophils Relative: 0.6 % (ref 0.0–1.0)
Eosinophils Absolute: 125 /uL (ref 15–500)
Eosinophils Relative: 1.5 % (ref 0.0–8.0)
Lymphocytes Absolute: 2722 /uL (ref 850–3900)
Lymphocytes Relative: 32.8 % (ref 15.0–45.0)
Monocytes Absolute: 614 /uL (ref 200–950)
Monocytes Relative: 7.4 % (ref 0.0–12.0)
Neutrophils Absolute: 4789 /uL (ref 1500–7800)
Neutrophils Relative: 57.7 % (ref 40.0–80.0)
nRBC: 0 /100 WBC (ref 0–0)

## 2016-12-15 LAB — VITAMIN B12: Vitamin B-12: 844 pg/mL (ref 180–914)

## 2016-12-15 LAB — VITAMIN D 25 HYDROXY: Vit D, 25-Hydroxy: 16.4 ng/mL (ref 30.0–100)

## 2016-12-15 MED ORDER — omeprazole (PRILOSEC) 40 MG capsule
40 | ORAL_CAPSULE | Freq: Every morning | ORAL | 1 refills | Status: AC
Start: 2016-12-15 — End: 2017-05-22

## 2016-12-15 MED ORDER — cyanocobalamin, vitamin B-12, 1,000 mcg Subl
1000 | ORAL_TABLET | Freq: Every day | SUBLINGUAL | 3 refills | Status: AC
Start: 2016-12-15 — End: 2017-05-22

## 2016-12-15 NOTE — Unmapped (Signed)
[facility] [address] [place] [address]    Name:  Sarah Huang Date of Birth: Nov 06, 1955 (61 y.o.)   MRN: [id number]    Date of Service:  12/15/2016     Subjective:     Chief Complaint   Patient presents with   ??? Abdominal Pain     Pt here today c/o alot of stomach pain, pt using a Vegan diet does not think she should be having all of this pain   ??? Gastroesophageal Reflux     No fevers, sweats/chills, No vomitting, diarrhea or constipation   ??? Fatigue   ??? Gas   ??? Nausea     History of Present Illness:  Sarah Huang is a(n) 61 y.o. female here today for the following:   Here for urgent visit for abd pain.  Also c/o indigestion and fatigue.    On vegan diet, upset because shouldn't have pain on this diet. Had been on omeprazole previously has not needed for about a year since changed diet.  Has gained some weight back.  Eating a lot of nuts.  A pound of cashews a week.    Feels tight around upper abdomen, stomach feels anxious.  Worse with eating, comes on about 15 min after eating.    Sometimes nausea, infrequent.  Also having indigestion, feels like shouldn't have this on her diet.  Does not feel like under stress, loves job at [employer].  Some flank pain, mainly on left, thinks different than abd pain.  No urinary symptoms.  No bowel changes, no fevers.  Bowels better on current diet.    Stopped alendronate a year ago.    Taking ibuprofen daily for back pain.    Wt Readings from Last 3 Encounters:   12/15/16 191 lb (86.6 kg)   05/19/16 186 lb (84.4 kg)   09/04/15 215 lb (97.5 kg)           HPI    Current Outpatient Prescriptions   Medication Sig Dispense Refill   ??? cetirizine (ZYRTEC) 10 MG tablet Take 10 mg by mouth daily.     ??? ibuprofen (ADVIL,MOTRIN) 800 MG tablet Take 1 tablet by mouth  every 8 hours as needed for pain 270 tablet 1     No current facility-administered medications for this visit.       Review of Systems    Constitutional: Positive for weight gain. Negative for chills and fever.   Respiratory: Negative for cough and shortness of breath.    Cardiovascular: Negative for chest pain.   Gastrointestinal: Positive for abdominal pain, bloating and nausea. Negative for constipation, diarrhea, heartburn and vomiting.   Genitourinary: Positive for flank pain. Negative for dysuria and hematuria.   Musculoskeletal: Positive for back pain.   Skin: Negative for rash.   Neurological: Negative for dizziness, light-headedness and headaches.            Objective:     Vitals:    12/15/16 1449   BP: 120/78   BP Location: Left arm   Patient Position: Sitting   BP Cuff Size: Large   Pulse: 80   Temp: 98 ??F (36.7 ??C)   TempSrc: Oral   Weight: 191 lb (86.6 kg)     Body mass index is 34.65 kg/m??.    Physical Exam   WDWN, NAD.  Chest clear.  No CVA tenderness to palpation.  CV nml S1,S2, no murmur.  Abdomen  with nml BS.  Tender to palpation mid epigastrium, bilat upper quadrants.  No organomegaly or mass.  No rebound or guarding.           Assessment/Plan:     Epigastric pain - timing and quality of pain most likely gastritis or PUD.  May be related to NSAID use.  Will place back on omeprazole, advised to minimize ibuprofen as much as possible.  Will check labs.  Pt will call if symptoms not improved in 1 to 2 weeks.    Flank pain - pt has hx of kidney stone. Also has chronic microscopic hematuria so urinalysis usually not helpful in making dx. Will obtain renal ultrasound to evaluate.    Weight gain - advised watching portions of foods she is eating. May need to reduce amount of fats, nuts and complex carbs in order to lose weight.    Vitamin deficiencies - pt has been off supplements, will check levels.

## 2016-12-21 ENCOUNTER — Ambulatory Visit: Payer: PRIVATE HEALTH INSURANCE

## 2016-12-28 ENCOUNTER — Inpatient Hospital Stay: Admit: 2016-12-28 | Payer: PRIVATE HEALTH INSURANCE

## 2016-12-28 DIAGNOSIS — R109 Unspecified abdominal pain: Secondary | ICD-10-CM

## 2017-03-13 MED ORDER — ergocalciferol (VITAMIN D2) 50,000 unit capsule
1250 | ORAL_CAPSULE | ORAL | 1 refills | Status: AC
Start: 2017-03-13 — End: 2017-05-22

## 2017-03-13 NOTE — Telephone Encounter (Signed)
Last phy 05-19-16  Last Dexa scan 2017  Last Vit D 05-19-15

## 2017-03-16 ENCOUNTER — Inpatient Hospital Stay: Admit: 2017-03-16 | Discharge: 2017-03-20 | Payer: PRIVATE HEALTH INSURANCE

## 2017-03-16 DIAGNOSIS — M810 Age-related osteoporosis without current pathological fracture: Secondary | ICD-10-CM

## 2017-05-22 ENCOUNTER — Other Ambulatory Visit: Admit: 2017-05-22 | Payer: PRIVATE HEALTH INSURANCE

## 2017-05-22 ENCOUNTER — Ambulatory Visit: Admit: 2017-05-22 | Discharge: 2017-05-22 | Payer: PRIVATE HEALTH INSURANCE

## 2017-05-22 DIAGNOSIS — Z0000 Encounter for general adult medical examination without abnormal findings: Secondary | ICD-10-CM

## 2017-05-22 DIAGNOSIS — E559 Vitamin D deficiency, unspecified: Secondary | ICD-10-CM

## 2017-05-22 LAB — LIPID PANEL
Cholesterol, Total: 280 mg/dL — ABNORMAL HIGH (ref 0–200)
HDL: 48 mg/dL — ABNORMAL LOW (ref 60–92)
LDL Cholesterol: 180 mg/dL
Triglycerides: 260 mg/dL — ABNORMAL HIGH (ref 10–149)

## 2017-05-22 LAB — COMPREHENSIVE METABOLIC PANEL, SERUM
ALT: 12 U/L (ref 7–52)
AST (SGOT): 19 U/L (ref 13–39)
Albumin: 4.4 g/dL (ref 3.5–5.7)
Alkaline Phosphatase: 81 U/L (ref 36–125)
Anion Gap: 10 mmol/L (ref 3–16)
BUN: 12 mg/dL (ref 7–25)
CO2: 27 mmol/L (ref 21–33)
Calcium: 9.9 mg/dL (ref 8.6–10.3)
Chloride: 104 mmol/L (ref 98–110)
Creatinine: 0.61 mg/dL (ref 0.60–1.30)
Glucose: 97 mg/dL (ref 70–100)
Osmolality, Calculated: 292 mosm/kg (ref 278–305)
Potassium: 4.5 mmol/L (ref 3.5–5.3)
Sodium: 141 mmol/L (ref 133–146)
Total Bilirubin: 0.4 mg/dL (ref 0.0–1.5)
Total Protein: 7.7 g/dL (ref 6.4–8.9)
eGFR AA CKD-EPI: >90 See note.
eGFR NONAA CKD-EPI: >90 See note.

## 2017-05-22 LAB — CBC
Hematocrit: 41.4 % (ref 35.0–45.0)
Hemoglobin: 14 g/dL (ref 11.7–15.5)
MCH: 30.8 pg (ref 27.0–33.0)
MCHC: 33.9 g/dL (ref 32.0–36.0)
MCV: 90.8 fL (ref 80.0–100.0)
MPV: 7.6 fL (ref 7.5–11.5)
Platelets: 277 10E3/uL (ref 140–400)
RBC: 4.56 10E6/uL (ref 3.80–5.10)
RDW: 12.6 % (ref 11.0–15.0)
WBC: 6.9 10E3/uL (ref 3.8–10.8)

## 2017-05-22 LAB — DIFFERENTIAL
Basophils Absolute: 41 /uL (ref 0–200)
Basophils Relative: 0.6 % (ref 0.0–1.0)
Eosinophils Absolute: 110 /uL (ref 15–500)
Eosinophils Relative: 1.6 % (ref 0.0–8.0)
Lymphocytes Absolute: 2408 /uL (ref 850–3900)
Lymphocytes Relative: 34.9 % (ref 15.0–45.0)
Monocytes Absolute: 518 /uL (ref 200–950)
Monocytes Relative: 7.5 % (ref 0.0–12.0)
Neutrophils Absolute: 3823 /uL (ref 1500–7800)
Neutrophils Relative: 55.4 % (ref 40.0–80.0)
nRBC: 0 /100{WBCs} (ref 0–0)

## 2017-05-22 LAB — VITAMIN D 25 HYDROXY: Vit D, 25-Hydroxy: 26.5 ng/mL — ABNORMAL LOW (ref 30.0–100)

## 2017-05-22 MED ORDER — ergocalciferol (VITAMIN D2) 50,000 unit capsule
1250 | ORAL_CAPSULE | ORAL | 3 refills | Status: AC
Start: 2017-05-22 — End: 2018-04-02

## 2017-05-22 MED ORDER — cyanocobalamin, vitamin B-12, 1,000 mcg Subl
1000 | ORAL_TABLET | Freq: Every day | SUBLINGUAL | 3 refills | Status: AC
Start: 2017-05-22 — End: 2018-01-24

## 2017-05-22 MED ORDER — omeprazole (PRILOSEC) 40 MG capsule
40 | ORAL_CAPSULE | Freq: Every morning | ORAL | 3 refills | Status: AC
Start: 2017-05-22 — End: 2017-11-20

## 2017-05-22 NOTE — Progress Notes (Signed)
[facility] [address] [place] [address]    Name:  QUINNIE SALVAGE Date of Birth: 10-30-1955 (62 y.o.)   MRN: [id number]    Date of Service:  05/22/2017     Subjective:     Chief Complaint   Patient presents with    Annual Exam     Pt is fasting     History of Present Illness:  NAYELIE GRABIEC is a(n) 62 y.o. female here today for the following:   Here for annual exam.    abd pain resolved on omeprazole.    States up to date on tetanus.    Still eating mostly vegan.  Feels good.    Wt Readings from Last 3 Encounters:   05/22/17 191 lb (86.6 kg)   12/15/16 191 lb (86.6 kg)   05/19/16 186 lb (84.4 kg)           HPI    Current Outpatient Prescriptions   Medication Sig Dispense Refill    cetirizine (ZYRTEC) 10 MG tablet Take 10 mg by mouth daily.      cyanocobalamin, vitamin B-12, 1,000 mcg Subl Place 1 tablet under the tongue daily. 100 tablet 3    ergocalciferol (VITAMIN D2) 50,000 unit capsule Take 1 capsule (50,000 Units total) by mouth once a week. 12 capsule 1    ibuprofen (ADVIL,MOTRIN) 800 MG tablet Take 1 tablet by mouth  every 8 hours as needed for pain 270 tablet 1    omeprazole (PRILOSEC) 40 MG capsule Take 1 capsule (40 mg total) by mouth every morning before breakfast. 30 capsule 1     No current facility-administered medications for this visit.       Review of Systems   Constitutional: Negative for chills and fever.   HENT: Negative for congestion and rhinorrhea.    Eyes: Negative for visual disturbance.   Respiratory: Negative for cough, shortness of breath and wheezing.         Snores   Cardiovascular: Negative for chest pain and palpitations.   Gastrointestinal: Negative for abdominal pain, constipation, diarrhea, heartburn, nausea and vomiting.   Genitourinary: Positive for nocturia. Negative for dysuria and frequency.   Musculoskeletal: Negative for arthralgias.   Skin: Negative for rash.   Neurological: Positive for headaches  (seasonal allergies). Negative for dizziness and light-headedness.   All other systems reviewed and are negative.           Objective:     Vitals:    05/22/17 0954   BP: 146/82   BP Location: Left arm   Patient Position: Sitting   BP Cuff Size: Large   Pulse: 78   Weight: 191 lb (86.6 kg)   Height: 5' 3 (1.6 m)     Body mass index is 33.83 kg/m.    Physical Exam   Vitals reviewed.  Constitutional: She is oriented to person, place, and time. She appears well-developed and well-nourished. No distress.   HENT:   Head: Normocephalic and atraumatic.   Right Ear: Hearing, tympanic membrane, external ear and ear canal normal.   Left Ear: Hearing, tympanic membrane, external ear and ear canal normal.   Nose: Nose normal.   Mouth/Throat: Oropharynx is clear and moist and mucous membranes are normal. No oropharyngeal exudate.   Eyes: Pupils are equal, round, and reactive to light. Conjunctivae and EOM are normal. Right eye exhibits no discharge. Left eye exhibits no discharge. No scleral icterus.  Neck: Normal range of motion. Neck supple. No thyromegaly present.   Cardiovascular: Normal rate, regular rhythm, normal heart sounds and intact distal pulses.  Exam reveals no gallop and no friction rub.    No murmur heard.  Pulmonary/Chest: Effort normal and breath sounds normal. She has no wheezes. She has no rales.   Abdominal: Bowel sounds are normal. She exhibits no distension and no mass. There is no hepatosplenomegaly. There is no tenderness. There is no rebound and no guarding.   Genitourinary:   Genitourinary Comments: S/p bilat mastectomy, reconstruction.  No masses.   Musculoskeletal: Normal range of motion. She exhibits no edema.   Lymphadenopathy:     She has no cervical adenopathy.   Neurological: She is alert and oriented to person, place, and time. She has normal reflexes.   Skin: Skin is warm and dry. No rash noted. She is not diaphoretic. No erythema.   Psychiatric: She has a normal mood and affect. Her  behavior is normal. Judgment and thought content normal.                 Assessment/Plan:     Patient Active Problem List   Diagnosis    Esophageal reflux    Elevated blood pressure reading without diagnosis of hypertension- BP nml today. Continue to watch.    Calculus of kidney    Plantar fascial fibromatosis    Insomnia    Family history of diabetes mellitus    Osteoporosis    Postablative ovarian failure    Fatty liver    Weight gain    Hypercholesteremia- check lipid panel.    Urinary retention with incomplete bladder emptying    Hearing loss    Trochanteric bursitis of left hip    SUI (stress urinary incontinence, female)    Post-void dribbling    Microscopic hematuria    Fibromyalgia    Malignant neoplasm of right female breast (CMS Dx)    Family history of breast cancer    S/P bilateral mastectomy    Breast cancer, female (CMS Dx)    Acquired absence of both breasts and nipples    Chronic back pain    Use of tamoxifen (Nolvadex)    Migraine without aura and without status migrainosus, not intractable     Sleep apnea - pt states could not tolerate CPAP.  Has not been evaluated for some time.  Will refer to sleep medicine.    Preventative care - check labs.  Up to date screening.

## 2017-08-03 MED ORDER — SUMAtriptan (IMITREX) 100 MG tablet
100 | ORAL_TABLET | ORAL | 2 refills | 23.00000 days | Status: AC
Start: 2017-08-03 — End: ?

## 2017-08-03 NOTE — Unmapped (Signed)
Last refill 05/22/17  Last phy 05/22/17

## 2017-11-20 MED ORDER — omeprazole (PRILOSEC) 40 MG capsule
40 | ORAL_CAPSULE | ORAL | 3 refills | Status: AC
Start: 2017-11-20 — End: 2019-01-30

## 2017-11-20 NOTE — Unmapped (Signed)
Last Refilled: 05-22-17  Last visit with Provider: 05/22/2017 Marjory Sneddon, MD  Next appointment in this department: Visit date not found   Last Complete Physical: 05/22/2017

## 2017-12-13 NOTE — Telephone Encounter (Signed)
Will not order labs without seeing her.  If she feels needs to be seen sooner can add on tomorrow afternoon. (Thursday) or pt can go to ED.

## 2017-12-13 NOTE — Telephone Encounter (Signed)
Can add on at 1:00 on THurs, 12/11.

## 2017-12-13 NOTE — Telephone Encounter (Signed)
Pt had a recent fall and is also suffering with high BP she would like to be fit into drs schedule with in the next week does not want to see a partner

## 2017-12-13 NOTE — Telephone Encounter (Signed)
Called pt offered appt 1 pm 12/12-19.  She accepted and asked for blood work prior to appt.  Pt stated had a fall Monday at work went to [facility] BP 190/90, thought it was due to pain.  Went again today for a follow-up BP was 188/80. Nurse told pt she needed to have her electrolytes checked. Pt stated she fell on L side and has L arm and flank pain.  I did suggest waiting until appt time. For discussion and exam.  Pt disagreed and requested a renal panel due to L sided flank pain.  I told her appt sch I would ask your advice and call her back if needed.

## 2017-12-21 ENCOUNTER — Ambulatory Visit: Admit: 2017-12-21 | Discharge: 2017-12-21 | Payer: PRIVATE HEALTH INSURANCE

## 2017-12-21 DIAGNOSIS — W19XXXS Unspecified fall, sequela: Secondary | ICD-10-CM

## 2017-12-21 MED ORDER — ibuprofen (ADVIL,MOTRIN) 800 MG tablet
800 | ORAL_TABLET | Freq: Three times a day (TID) | ORAL | 0 refills | Status: AC | PRN
Start: 2017-12-21 — End: 2018-07-27

## 2017-12-21 NOTE — Unmapped (Signed)
[facility]  [facility] [address]  [place] [address]    Name:  Sarah Huang Date of Birth: September 12, 1955 (62 y.o.)   MRN: [id number]    Date of Service:  12/21/2017     Subjective:     Chief Complaint   Patient presents with   ??? Blood Pressure Check     Pt had a fall 11 days ago BP was very high when checked out. Pt still feeling very stiff and achy from the fall.   ??? Arm Injury     L arm from the fall, by the end of the day very sore.     History of Present Illness:  Sarah Huang is a(n) 62 y.o. female here today for the following:   Here for follow up BP.  Had fall at work 11 days ago, stopped suddenly and turned, not sure if got off balance or stumbled but fell.  Might have felt a little lightheaded, no LOC. Fell on outstretched left arm, did not hurt wrist but now has aching all over.  Felt instant severe pain in left upper arm.  Seen at employee health, x-rays neg.  BP was very high but she was in severe pain.  Given ibuprofen. Gave her a sling for arm.  Seemed to her was making it worse.  Stopped wearing sling.  Two days after started getting pains in left flank.  That has improved, though still sore on that side. Painful to turn in bed.  Now has more movement in shoulder.  If moves in certain way has pain in shoulder and elbow, depending on how she moves.  Left arm feels weak.  Was taking ibuprofen tid, told to cut back to bid. Now taking tylenol according to bottle. 4 gms a day.    Wt Readings from Last 3 Encounters:   12/21/17 204 lb (92.5 kg)   05/22/17 191 lb (86.6 kg)   12/15/16 191 lb (86.6 kg)       HPI    Current Outpatient Medications   Medication Sig Dispense Refill   ??? cetirizine (ZYRTEC) 10 MG tablet Take 10 mg by mouth daily.     ??? cyanocobalamin, vitamin B-12, 1,000 mcg Subl Place 1 tablet under the tongue daily. 100 tablet 3   ??? ergocalciferol (VITAMIN D2) 50,000 unit capsule Take 1 capsule (50,000 Units total) by mouth once a week. 12  capsule 3   ??? ibuprofen (ADVIL,MOTRIN) 800 MG tablet Take 1 tablet by mouth  every 8 hours as needed for pain 270 tablet 1   ??? omeprazole (PRILOSEC) 40 MG capsule TAKE 1 CAPSULE(40 MG) BY MOUTH EVERY MORNING BEFORE BREAKFAST 90 capsule 3   ??? SUMAtriptan (IMITREX) 100 MG tablet Take one tablet po daily as needed for migraine, may repeat in 2 hours if needed in 24 hours. 12 tablet 2     No current facility-administered medications for this visit.       Review of Systems   Constitutional: Negative for chills and fever.   Genitourinary: Positive for flank pain.   Musculoskeletal: Positive for arthralgias and back pain.   Neurological: Negative for dizziness, light-headedness and headaches.            Objective:     Vitals:    12/21/17 1254   BP: 130/90   Pulse: 78   Weight: 204 lb (92.5 kg)     Body mass index is 36.14 kg/m??.  Physical Exam   WDWN, NAD.  Left arm with FROM, pain in shoulder , upper arm and at elbow with abduction above horizontal and full extension of elbow. No redness, warmth, swelling.  Mild tenderness with palpation left flank.  FROM lumbar and thoracic spine, some discomfort.                Assessment/Plan:     Left arm pain - s/p fall with hyperextension injury.  Had films per pt hx which were negative. Improving. Would continue ibuprofen tid for next two weeks.  If pain not improving consider ortho referral for shoulder and arm.  Ok to go back to full duty at work, note written.    Flank pain - muscular due to fall, improving.    Elevated BP - BP was high after fall at employee health. Now borderline. Pt has gained weight.  DASH diet information given.  Will have pt return in two months for BP check.

## 2017-12-21 NOTE — Unmapped (Signed)
DASH Eating Plan  DASH stands for Dietary Approaches to Stop Hypertension. The DASH eating plan is a healthy eating plan that has been shown to reduce high blood pressure (hypertension). It may also reduce your risk for type 2 diabetes, heart disease, and stroke. The DASH eating plan may also help with weight loss.  What are tips for following this plan?  General guidelines  ?? Avoid eating more than 2,300 mg (milligrams) of salt (sodium) a day. If you have hypertension, you may need to reduce your sodium intake to 1,500 mg a day.  ?? Limit alcohol intake to no more than 1 drink a day for nonpregnant women and 2 drinks a day for men. One drink equals 12 oz of beer, 5 oz of wine, or 1?? oz of hard liquor.  ?? Work with your health care provider to maintain a healthy body weight or to lose weight. Ask what an ideal weight is for you.  ?? Get at least 30 minutes of exercise that causes your heart to beat faster (aerobic exercise) most days of the week. Activities may include walking, swimming, or biking.  ?? Work with your health care provider or diet and nutrition specialist (dietitian) to adjust your eating plan to your individual calorie needs.  Reading food labels  ?? Check food labels for the amount of sodium per serving. Choose foods with less than 5 percent of the Daily Value of sodium. Generally, foods with less than 300 mg of sodium per serving fit into this eating plan.  ?? To find whole grains, look for the word whole as the first word in the ingredient list.  Shopping  ?? Buy products labeled as low-sodium or no salt added.  ?? Buy fresh foods. Avoid canned foods and premade or frozen meals.  Cooking  ?? Avoid adding salt when cooking. Use salt-free seasonings or herbs instead of table salt or sea salt. Check with your health care provider or pharmacist before using salt substitutes.  ?? Do not fry foods. Cook foods using healthy methods such as baking, boiling, grilling, and broiling instead.  ?? Cook with  heart-healthy oils, such as olive, canola, soybean, or sunflower oil.  Meal planning    ?? Eat a balanced diet that includes:  ?? 5 or more servings of fruits and vegetables each day. At each meal, try to fill half of your plate with fruits and vegetables.  ?? Up to 6-8 servings of whole grains each day.  ?? Less than 6 oz of lean meat, poultry, or fish each day. A 3-oz serving of meat is about the same size as a deck of cards. One egg equals 1 oz.  ?? 2 servings of low-fat dairy each day.  ?? A serving of nuts, seeds, or beans 5 times each week.  ?? Heart-healthy fats. Healthy fats called Omega-3 fatty acids are found in foods such as flaxseeds and coldwater fish, like sardines, salmon, and mackerel.  ?? Limit how much you eat of the following:  ?? Canned or prepackaged foods.  ?? Food that is high in trans fat, such as fried foods.  ?? Food that is high in saturated fat, such as fatty meat.  ?? Sweets, desserts, sugary drinks, and other foods with added sugar.  ?? Full-fat dairy products.  ?? Do not salt foods before eating.  ?? Try to eat at least 2 vegetarian meals each week.  ?? Eat more home-cooked food and less restaurant, buffet, and fast food.  ?? When   eating at a restaurant, ask that your food be prepared with less salt or no salt, if possible.  What foods are recommended?  The items listed may not be a complete list. Talk with your dietitian about what dietary choices are best for you.  Grains  Whole-grain or whole-wheat bread. Whole-grain or whole-wheat pasta. Brown rice. Oatmeal. Quinoa. Bulgur. Whole-grain and low-sodium cereals. Pita bread. Low-fat, low-sodium crackers. Whole-wheat flour tortillas.  Vegetables  Fresh or frozen vegetables (raw, steamed, roasted, or grilled). Low-sodium or reduced-sodium tomato and vegetable juice. Low-sodium or reduced-sodium tomato sauce and tomato paste. Low-sodium or reduced-sodium canned vegetables.  Fruits  All fresh, dried, or frozen fruit. Canned fruit in natural juice (without  added sugar).  Meat and other protein foods  Skinless chicken or turkey. Ground chicken or turkey. Pork with fat trimmed off. Fish and seafood. Egg whites. Dried beans, peas, or lentils. Unsalted nuts, nut butters, and seeds. Unsalted canned beans. Lean cuts of beef with fat trimmed off. Low-sodium, lean deli meat.  Dairy  Low-fat (1%) or fat-free (skim) milk. Fat-free, low-fat, or reduced-fat cheeses. Nonfat, low-sodium ricotta or cottage cheese. Low-fat or nonfat yogurt. Low-fat, low-sodium cheese.  Fats and oils  Soft margarine without trans fats. Vegetable oil. Low-fat, reduced-fat, or light mayonnaise and salad dressings (reduced-sodium). Canola, safflower, olive, soybean, and sunflower oils. Avocado.  Seasoning and other foods  Herbs. Spices. Seasoning mixes without salt. Unsalted popcorn and pretzels. Fat-free sweets.  What foods are not recommended?  The items listed may not be a complete list. Talk with your dietitian about what dietary choices are best for you.  Grains  Baked goods made with fat, such as croissants, muffins, or some breads. Dry pasta or rice meal packs.  Vegetables  Creamed or fried vegetables. Vegetables in a cheese sauce. Regular canned vegetables (not low-sodium or reduced-sodium). Regular canned tomato sauce and paste (not low-sodium or reduced-sodium). Regular tomato and vegetable juice (not low-sodium or reduced-sodium). Pickles. Olives.  Fruits  Canned fruit in a light or heavy syrup. Fried fruit. Fruit in cream or butter sauce.  Meat and other protein foods  Fatty cuts of meat. Ribs. Fried meat. Bacon. Sausage. Bologna and other processed lunch meats. Salami. Fatback. Hotdogs. Bratwurst. Salted nuts and seeds. Canned beans with added salt. Canned or smoked fish. Whole eggs or egg yolks. Chicken or turkey with skin.  Dairy  Whole or 2% milk, cream, and half-and-half. Whole or full-fat cream cheese. Whole-fat or sweetened yogurt. Full-fat cheese. Nondairy creamers. Whipped toppings.  Processed cheese and cheese spreads.  Fats and oils  Butter. Stick margarine. Lard. Shortening. Ghee. Bacon fat. Tropical oils, such as coconut, palm kernel, or palm oil.  Seasoning and other foods  Salted popcorn and pretzels. Onion salt, garlic salt, seasoned salt, table salt, and sea salt. Worcestershire sauce. Tartar sauce. Barbecue sauce. Teriyaki sauce. Soy sauce, including reduced-sodium. Steak sauce. Canned and packaged gravies. Fish sauce. Oyster sauce. Cocktail sauce. Horseradish that you find on the shelf. Ketchup. Mustard. Meat flavorings and tenderizers. Bouillon cubes. Hot sauce and Tabasco sauce. Premade or packaged marinades. Premade or packaged taco seasonings. Relishes. Regular salad dressings.  Where to find more information:  ?? National Heart, Lung, and Blood Institute: www.nhlbi.nih.gov  ?? American Heart Association: www.heart.org  Summary  ?? The DASH eating plan is a healthy eating plan that has been shown to reduce high blood pressure (hypertension). It may also reduce your risk for type 2 diabetes, heart disease, and stroke.  ?? With the DASH   eating plan, you should limit salt (sodium) intake to 2,300 mg a day. If you have hypertension, you may need to reduce your sodium intake to 1,500 mg a day.  ?? When on the DASH eating plan, aim to eat more fresh fruits and vegetables, whole grains, lean proteins, low-fat dairy, and heart-healthy fats.  ?? Work with your health care provider or diet and nutrition specialist (dietitian) to adjust your eating plan to your individual calorie needs.  This information is not intended to replace advice given to you by your health care provider. Make sure you discuss any questions you have with your health care provider.  Document Released: 12/16/2010 Document Revised: 12/21/2015 Document Reviewed: 12/21/2015  Elsevier Interactive Patient Education ?? 2017 Elsevier Inc.

## 2018-01-24 MED ORDER — cyanocobalamin, vitamin B-12, 1,000 mcg Subl
1000 | ORAL_TABLET | SUBLINGUAL | 3 refills | Status: AC
Start: 2018-01-24 — End: 2019-01-31

## 2018-01-24 NOTE — Unmapped (Signed)
Last Refilled: 05/22/17  Last visit with Provider: 12/21/2017 Marjory Sneddon, MD  Next appointment in this department: Visit date not found   Last Complete Physical: 05/22/2017    Last Office Visit: 12/21/2017

## 2018-04-02 MED ORDER — ergocalciferol (ERGOCALCIFEROL) 1,250 mcg (50,000 unit) capsule
1250 | ORAL_CAPSULE | ORAL | 3 refills | Status: AC
Start: 2018-04-02 — End: 2018-07-27

## 2018-07-17 NOTE — Unmapped (Signed)
Pt sched'd her phys. 8/13,  and would like to have bw drawn prior to appointment. Would like a CMP, CBC, A1C, TSH, B12, vitD, Calcium , cholesterol levels.    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     Pls call pt upon completion.    [facility].

## 2018-07-18 NOTE — Unmapped (Signed)
Called pt NA LM with details.  No covid titers available

## 2018-07-18 NOTE — Unmapped (Signed)
Tests ordered.  Cannot always guarantee ins will cover all tests, coded as best I could.

## 2018-07-27 MED ORDER — ibuprofen (MOTRIN) 800 MG tablet
800 | ORAL_TABLET | Freq: Three times a day (TID) | ORAL | 0 refills | Status: AC | PRN
Start: 2018-07-27 — End: 2018-11-09

## 2018-07-27 MED ORDER — ergocalciferol (ERGOCALCIFEROL) 1,250 mcg (50,000 unit) capsule
1250 | ORAL_CAPSULE | ORAL | 3 refills | Status: AC
Start: 2018-07-27 — End: ?

## 2018-07-27 NOTE — Unmapped (Signed)
Last Refilled: 12-21-17  Last visit with Provider: 12/21/2017 Marjory Sneddon, MD  Next appointment in this department: 08/23/2018 Marjory Sneddon, MD   Last Complete Physical: 05/22/2017    Last Office Visit: Visit date not found

## 2018-07-27 NOTE — Unmapped (Signed)
Last Refilled: 04-02-18  Last visit with Provider: 12/21/2017 Marjory Sneddon, MD  Next appointment in this department: 08/23/2018 Marjory Sneddon, MD   Last Complete Physical: 05/22/2017

## 2018-08-23 ENCOUNTER — Ambulatory Visit: Payer: PRIVATE HEALTH INSURANCE

## 2018-09-06 ENCOUNTER — Ambulatory Visit: Payer: PRIVATE HEALTH INSURANCE

## 2018-09-27 ENCOUNTER — Ambulatory Visit: Payer: PRIVATE HEALTH INSURANCE

## 2018-10-01 ENCOUNTER — Other Ambulatory Visit: Admit: 2018-10-01 | Payer: PRIVATE HEALTH INSURANCE

## 2018-10-01 DIAGNOSIS — K760 Fatty (change of) liver, not elsewhere classified: Secondary | ICD-10-CM

## 2018-10-01 LAB — COMPREHENSIVE METABOLIC PANEL, SERUM
ALT: 17 U/L (ref 7–52)
AST (SGOT): 15 U/L (ref 13–39)
Albumin: 4.2 g/dL (ref 3.5–5.7)
Alkaline Phosphatase: 69 U/L (ref 36–125)
Anion Gap: 7 mmol/L (ref 3–16)
BUN: 16 mg/dL (ref 7–25)
CO2: 29 mmol/L (ref 21–33)
Calcium: 9.6 mg/dL (ref 8.6–10.3)
Chloride: 104 mmol/L (ref 98–110)
Creatinine: 0.65 mg/dL (ref 0.60–1.30)
Glucose: 93 mg/dL (ref 70–100)
Osmolality, Calculated: 291 mosm/kg (ref 278–305)
Potassium: 4.3 mmol/L (ref 3.5–5.3)
Sodium: 140 mmol/L (ref 133–146)
Total Bilirubin: 0.3 mg/dL (ref 0.0–1.5)
Total Protein: 6.9 g/dL (ref 6.4–8.9)
eGFR AA CKD-EPI: >90 See note.
eGFR NONAA CKD-EPI: >90 See note.

## 2018-10-01 LAB — LIPID PANEL
Cholesterol, Total: 225 mg/dL — ABNORMAL HIGH (ref 0–200)
HDL: 49 mg/dL — ABNORMAL LOW (ref 60–92)
LDL Cholesterol: 98 mg/dL
Triglycerides: 389 mg/dL — ABNORMAL HIGH (ref 10–149)

## 2018-10-01 LAB — TSH: TSH: 1.18 u[IU]/mL (ref 0.45–4.12)

## 2018-10-01 LAB — VITAMIN D 25 HYDROXY: Vit D, 25-Hydroxy: 43.9 ng/mL (ref 30.0–100.0)

## 2018-10-01 LAB — HEMOGLOBIN A1C: Hemoglobin A1C: 5.8 % — ABNORMAL HIGH (ref 4.0–5.6)

## 2018-10-01 LAB — CBC
Hematocrit: 38 % (ref 35.0–45.0)
Hemoglobin: 12.8 g/dL (ref 11.7–15.5)
MCH: 30.2 pg (ref 27.0–33.0)
MCHC: 33.6 g/dL (ref 32.0–36.0)
MCV: 90 fL (ref 80.0–100.0)
MPV: 7.8 fL (ref 7.5–11.5)
Platelets: 269 10*3/uL (ref 140–400)
RBC: 4.23 10*6/uL (ref 3.80–5.10)
RDW: 12.4 % (ref 11.0–15.0)
WBC: 6.9 10*3/uL (ref 3.8–10.8)

## 2018-10-01 LAB — DIFFERENTIAL
Basophils Absolute: 35 /uL (ref 0–200)
Basophils Relative: 0.5 % (ref 0.0–1.0)
Eosinophils Absolute: 117 /uL (ref 15–500)
Eosinophils Relative: 1.7 % (ref 0.0–8.0)
Lymphocytes Absolute: 2532 /uL (ref 850–3900)
Lymphocytes Relative: 36.7 % (ref 15.0–45.0)
Monocytes Absolute: 497 /uL (ref 200–950)
Monocytes Relative: 7.2 % (ref 0.0–12.0)
Neutrophils Absolute: 3719 /uL (ref 1500–7800)
Neutrophils Relative: 53.9 % (ref 40.0–80.0)
nRBC: 0 /100{WBCs} (ref 0–0)

## 2018-10-01 LAB — VITAMIN B12: Vitamin B-12: 570 pg/mL (ref 180–914)

## 2018-11-09 MED ORDER — ibuprofen (MOTRIN) 800 MG tablet
800 | ORAL_TABLET | Freq: Three times a day (TID) | ORAL | 0 refills | Status: AC | PRN
Start: 2018-11-09 — End: ?

## 2018-11-09 NOTE — Unmapped (Signed)
Last Refilled: 07-27-18  Last visit with Provider: 12/21/2017 Marjory Sneddon, MD  Next appointment in this department: Visit date not found   Last Complete Physical: 05/22/2017    NEEDS PHYSICAL PRIOR TO NEXT REFILL

## 2019-01-30 MED ORDER — omeprazole (PRILOSEC) 40 MG capsule
40 | ORAL_CAPSULE | ORAL | 1 refills | Status: AC
Start: 2019-01-30 — End: ?

## 2019-01-30 NOTE — Unmapped (Signed)
Last Refilled: 11-20-17  Last visit with Provider: 12/21/2017 Marjory Sneddon, MD  Next appointment in this department: Visit date not found   Last Complete Physical: 05/22/2017    Last Office Visit: Visit date not found

## 2019-01-31 MED ORDER — cyanocobalamin, vitamin B-12, 1,000 mcg Subl
1000 | ORAL_TABLET | SUBLINGUAL | 3 refills | Status: AC
Start: 2019-01-31 — End: ?

## 2019-01-31 NOTE — Unmapped (Signed)
Last Refilled: 01/24/18  Last visit with Provider: 12/21/2017 Marjory Sneddon, MD  Next appointment in this department: Visit date not found   Last Complete Physical: 05/22/2017

## 2019-12-31 IMAGING — MR MRI LUMBAR SPINE WITHOUT CONTRAST
4 of 5 series · 21 of 48 positions shown · IV contrast (gadolinium)
Comparison: 12/13/2019 [facility]

MRI THORACIC SPINE WITHOUT CONTRAST, MRI LUMBAR SPINE WITHOUT CONTRAST, 
12/31/2019 [DATE]: 
CLINICAL INDICATION: Evaluate for stenosis or disc herniation. Right-sided back 
pain since June.
TECHNIQUE: Multiplanar, multiecho position MR images of the thoracic and lumbar 
spine were performed without intravenous gadolinium enhancement.

[Series 1301: survey · axial · 10.0mm · 1.39mm/px · z∈[-331,-55]mm · 5 of 10 slices shown]
[im 1/10]
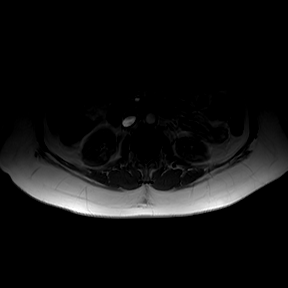
[im 3/10]
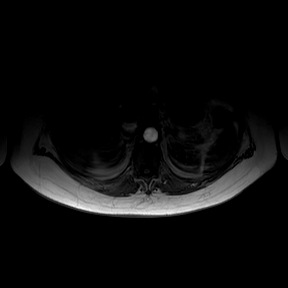
[im 5/10]
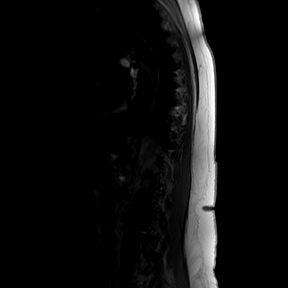
[im 7/10]
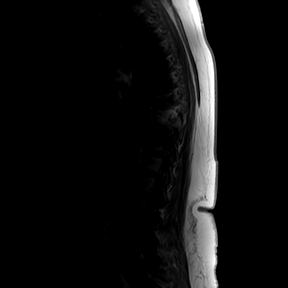
[im 10/10]
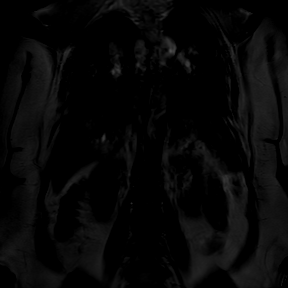

[Series 1401: t2w_cor-surv · coronal · 6.0mm · 0.60mm/px · 3 of 5 slices shown]
[im 1/5]
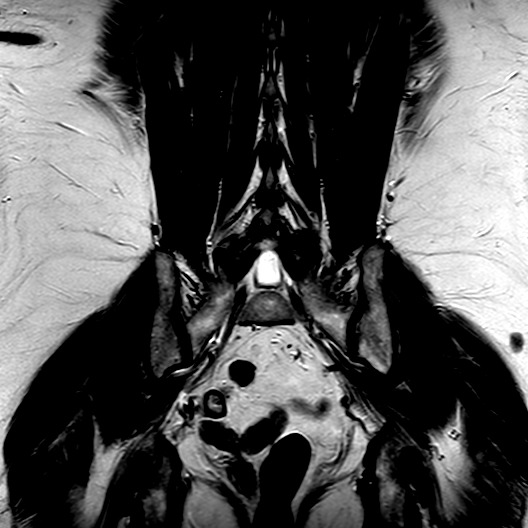
[im 3/5]
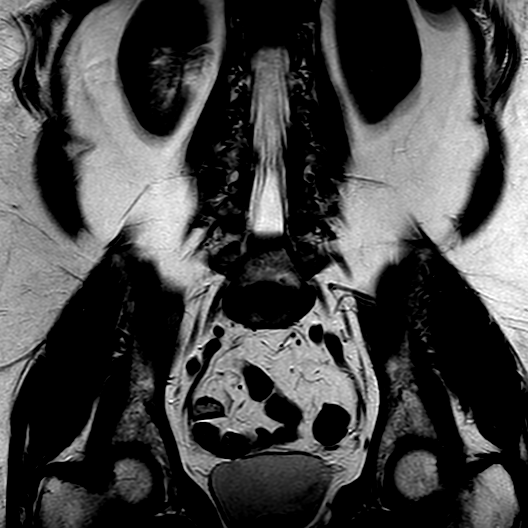
[im 5/5]
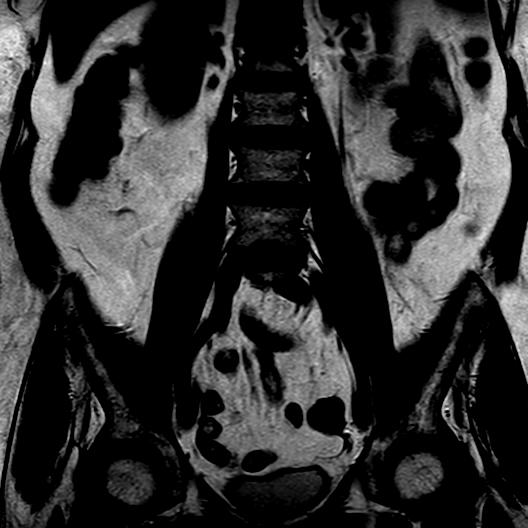

[Series 1501: t1_tse_sag · sagittal · 4.0mm · 0.48mm/px · 4 of 17 slices shown]
[im 1/17]
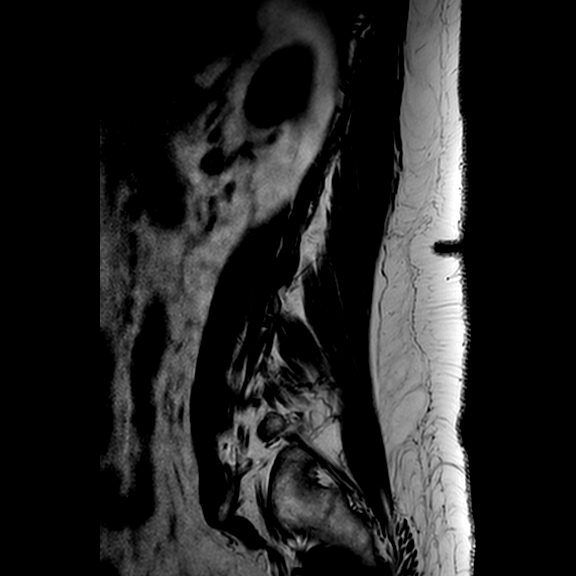
[im 2/17]
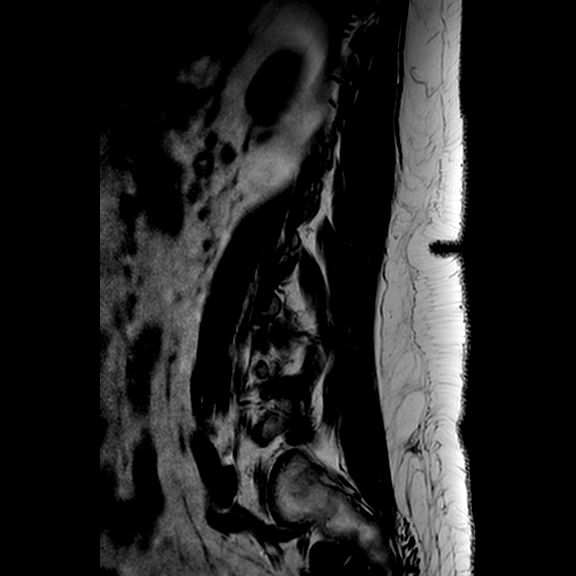
[im 9/17]
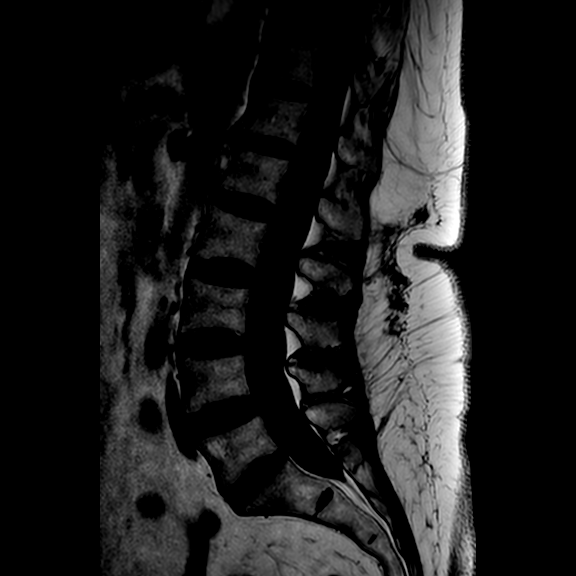
[im 15/17]
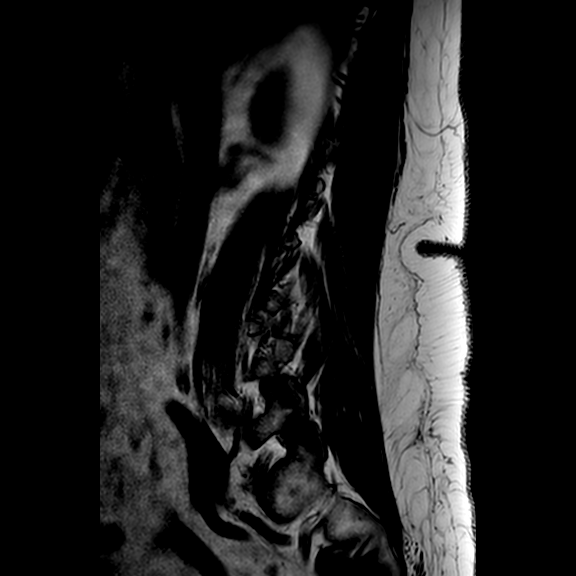

[Series 1901: T1 · axial · 4.0mm · 0.38mm/px · z∈[-500,-298]mm · 9 of 35 slices shown]
[im 2/35]
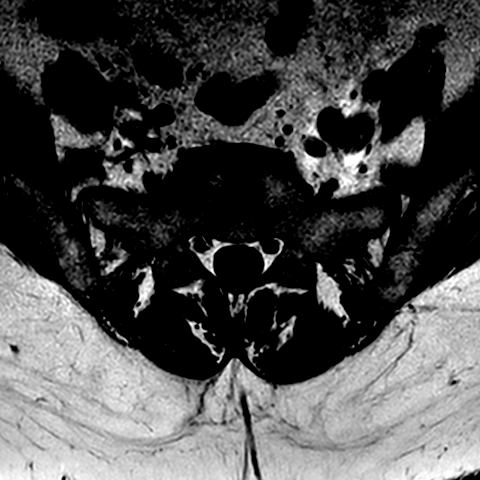
[im 6/35]
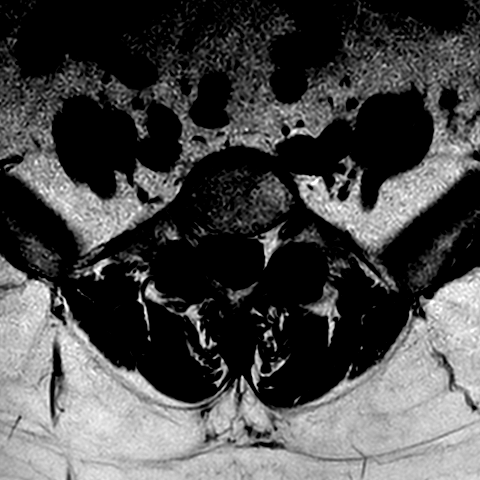
[im 11/35]
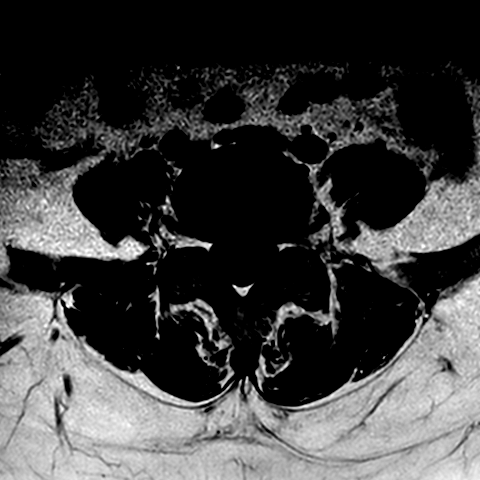
[im 15/35]
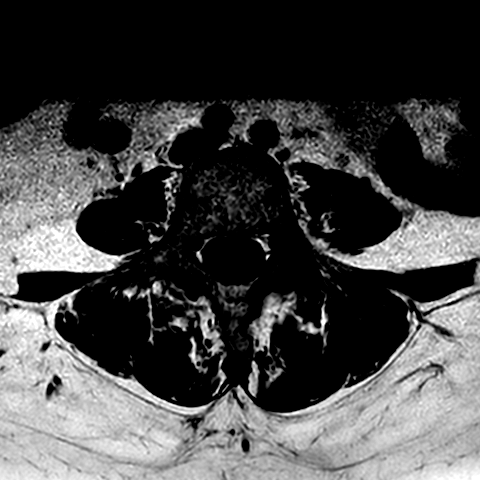
[im 18/35]
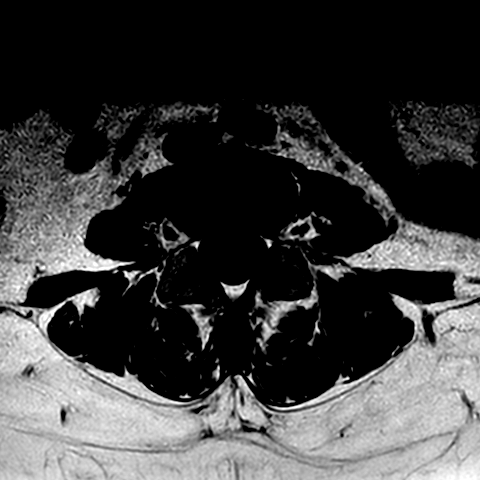
[im 20/35]
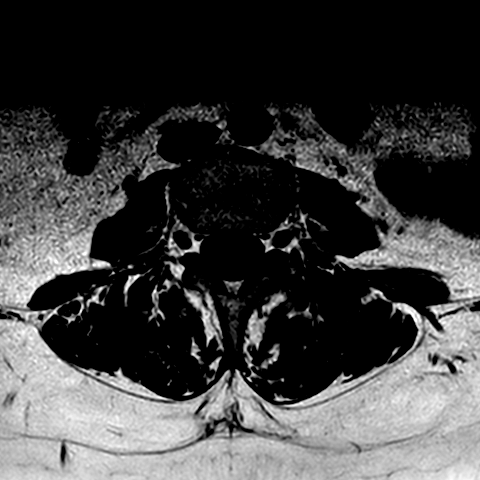
[im 24/35]
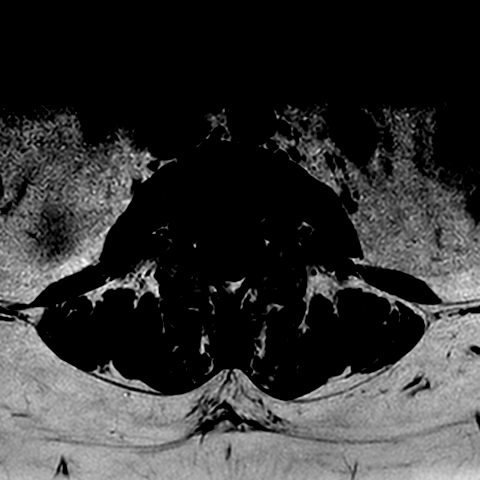
[im 29/35]
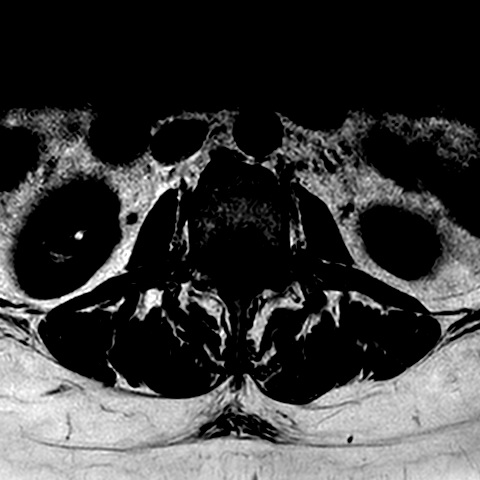
[im 33/35]
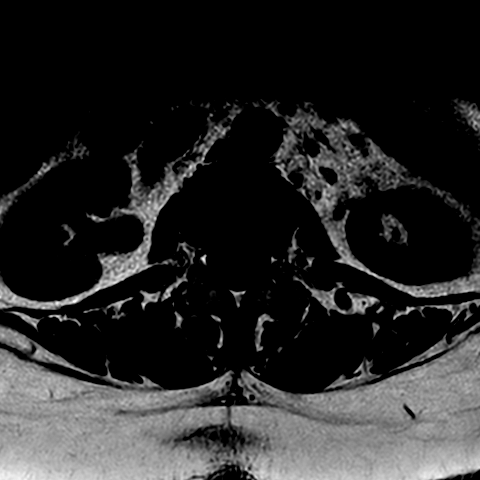

[21 of 48 positions shown; findings below may reference images not displayed]

FINDINGS: VERTEBRA: The vertebral bodies are normal in height and alignment. No acute 
vertebral body fracture. No spondylolisthesis. Small marginal osteophytes, most 
marked at T7-9 and T12-L1. Small L5 inferior endplate Schmorl node. Type II 
Modic changes at L5-S1. Mild thoracic levocurvature. No fractures or marrow 
replacing lesions. No central canal, lateral recess or neural foraminal 
stenosis. 
SPINAL CORD: Spinal cord is normal in caliber and signal intensity. Conus 
medullaris is at the level of L1-2. 
THORACIC DISCS: Multilevel degenerative disc disease, most marked at T7-8 and 
T8-9. Small T11-12 broad-based central disc protrusion measures 2 mm in AP 
dimensions. 
T12-L1: Small broad-based central disc protrusion measures 3 mm in AP 
dimensions. Mild disc space narrowing and disc desiccation. No disc herniation. 
Normal facets. No spinal canal or neural foraminal stenosis. 
L1-L2: The disc is normal in height and signal. No disc herniation. Normal 
facets. No spinal canal or neural foraminal stenosis. 
L2-L3: Small broad-based central disc protrusion measures 3 mm in AP dimensions. 
The disc is normal in height and signal. No disc herniation. Normal facets. No 
spinal canal or neural foraminal stenosis. 
L3-L4: The disc is normal in height and signal. No disc herniation. Mild facet 
arthropathy. No spinal canal or neural foraminal stenosis. 
L4-L5: Annular tear in the left subarticular region. The disc is normal in 
height with disc desiccation. No disc herniation. Moderate left and mild right 
facet arthropathy. No spinal canal or neural foraminal stenosis. 
L5-S1: Mild disc space narrowing and disc desiccation. No disc herniation. Mild 
facet arthropathy. No spinal canal or neural foraminal stenosis. 
OTHER: The aorta is normal in diameter. The posterior paraspinal soft tissues 
are negative.
IMPRESSION: 1. Multilevel degenerative change without stenosis. 
2. Small central broad-based disc protrusions at T11-12, T12-L1 and L2-3. 
3. Annular tear in the L4-5 left subarticular region. 
4. Mild thoracic levocurvature.

## 2019-12-31 IMAGING — MR MRI CERVICAL SPINE WITHOUT CONTRAST
4 of 5 series · 26 of 48 positions shown · IV contrast (gadolinium)
Comparison: 12/13/2019 [facility]

MRI CERVICAL SPINE WITHOUT CONTRAST, 12/31/2019 [DATE]: 
CLINICAL INDICATION: Evaluate for stenosis or disc herniation.
TECHNIQUE: Multiplanar, multiecho position MR images of the cervical spine were 
performed without intravenous gadolinium enhancement.

[Series 101: survey* · axial · 10.0mm · 1.56mm/px · z∈[-30,+199]mm · 8 of 15 slices shown]
[im 1/15]
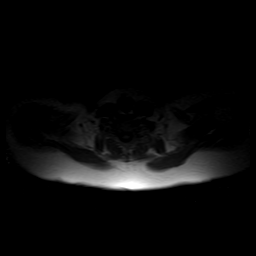
[im 3/15]
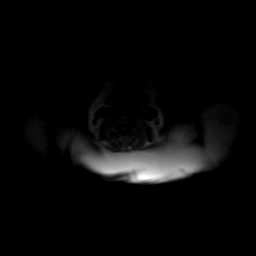
[im 5/15]
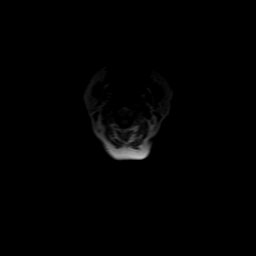
[im 7/15]
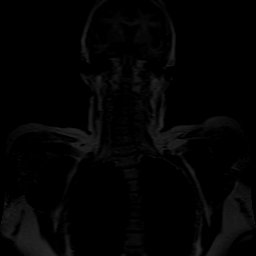
[im 9/15]
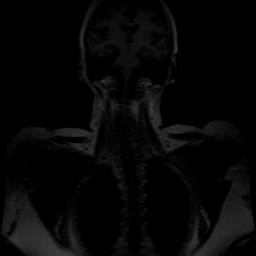
[im 11/15]
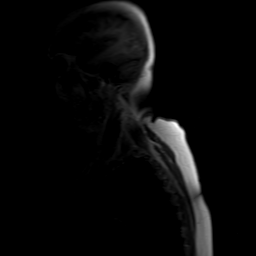
[im 13/15]
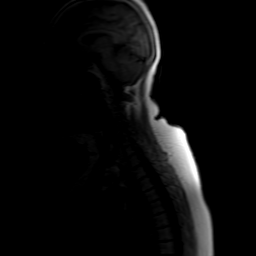
[im 15/15]
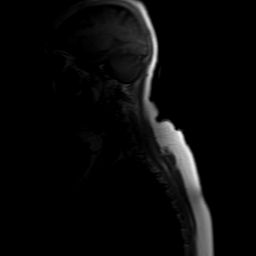

[Series 201: t2w_cor-surv · coronal · 5.0mm · 0.85mm/px · 4 of 7 slices shown]
[im 1/7]
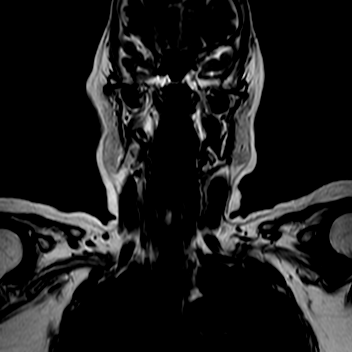
[im 3/7]
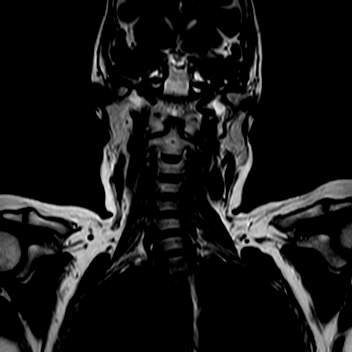
[im 5/7]
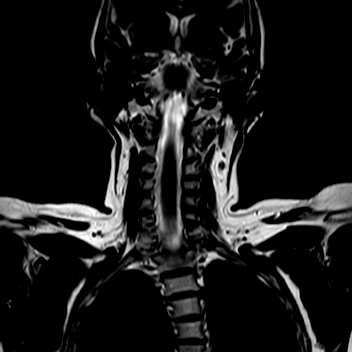
[im 7/7]
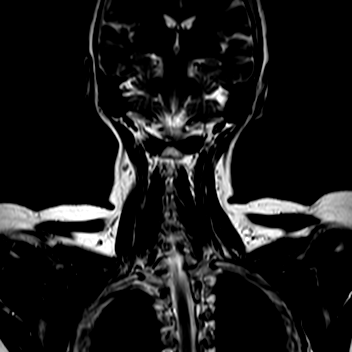

[Series 401: t2_sag · sagittal · 3.0mm · 0.35mm/px · 9 of 15 slices shown]
[im 1/15]
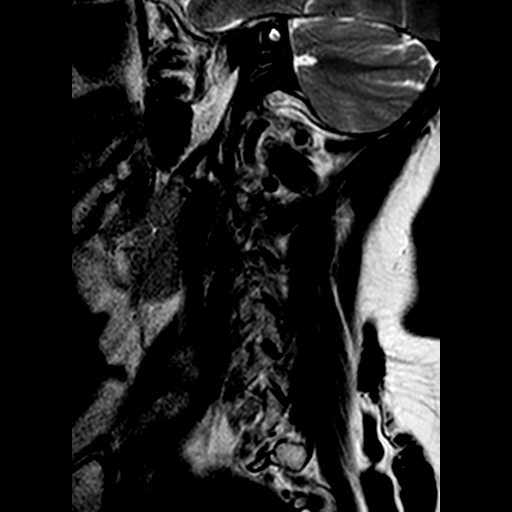
[im 2/15]
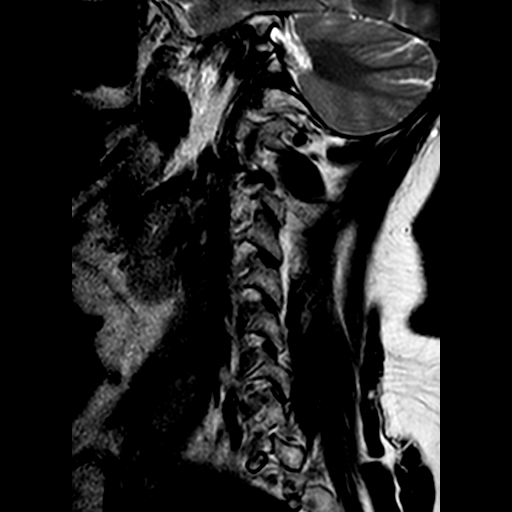
[im 4/15]
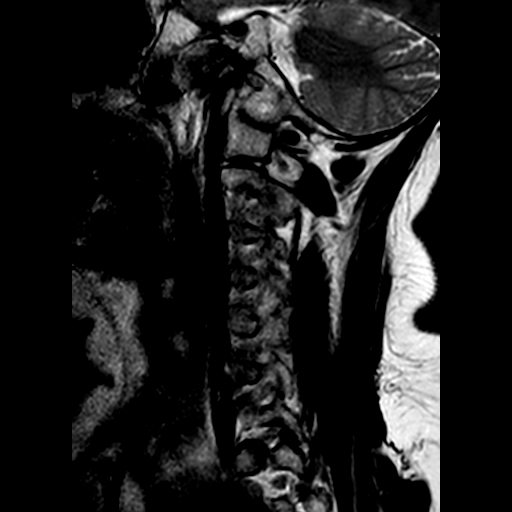
[im 6/15]
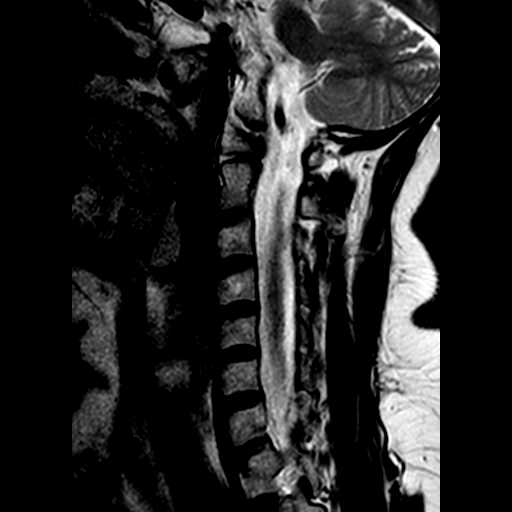
[im 8/15]
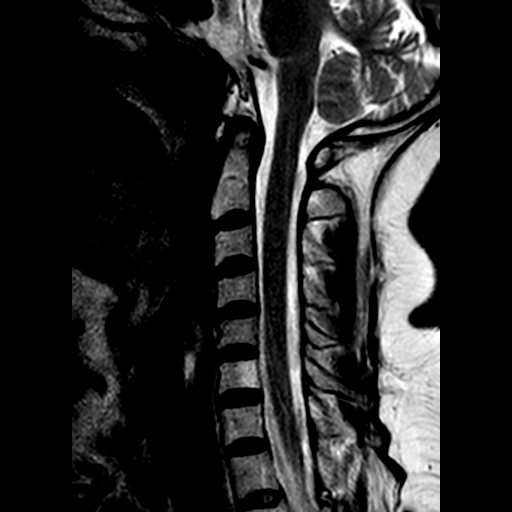
[im 9/15]
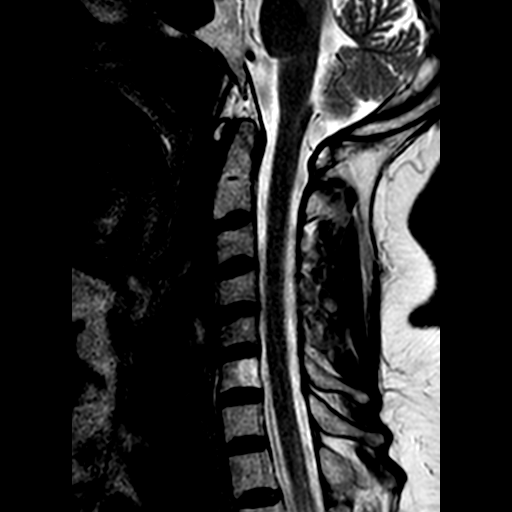
[im 11/15]
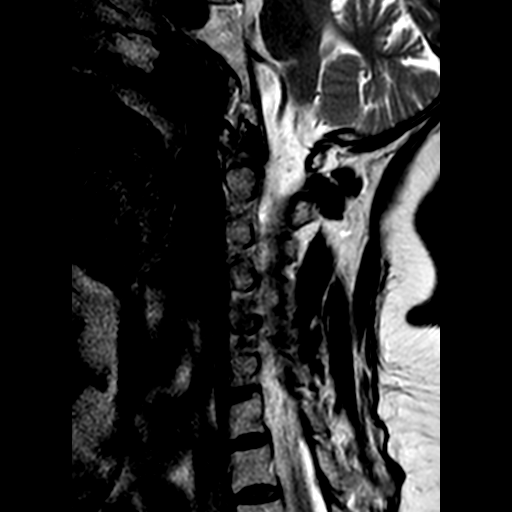
[im 13/15]
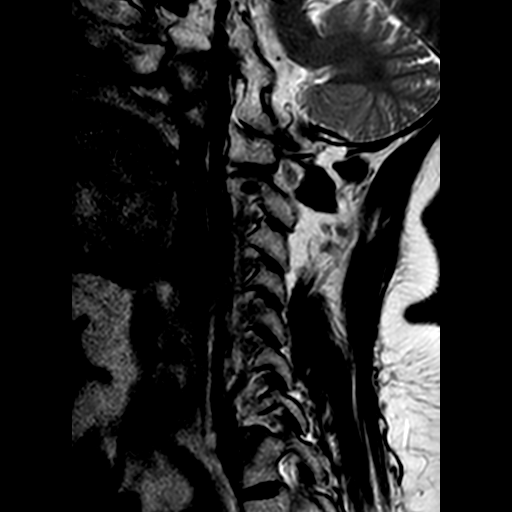
[im 15/15]
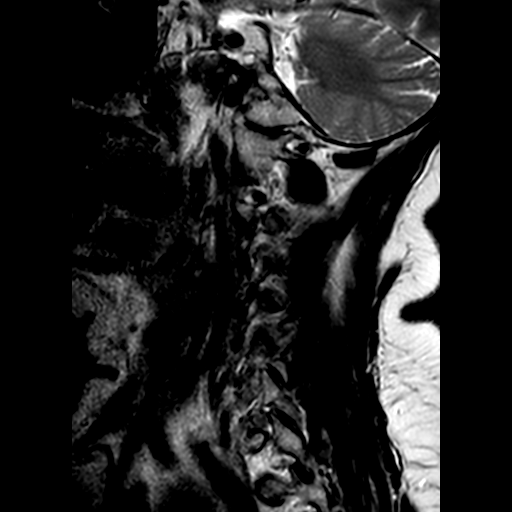

[Series 601: t2_(person_name)_(person_name) · axial · 3.0mm · 0.29mm/px · z∈[-74,+0]mm · 5 of 30 slices shown]
[im 2/30]
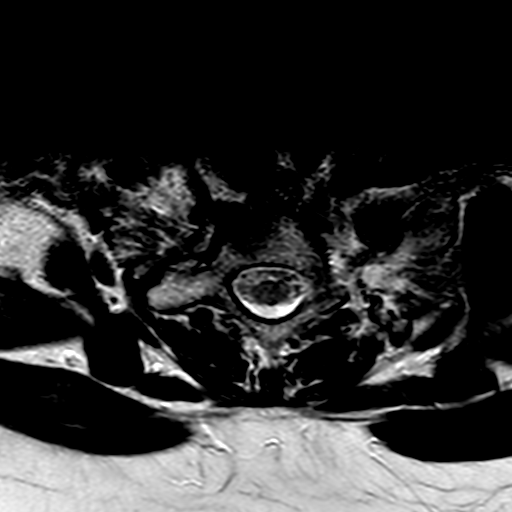
[im 6/30]
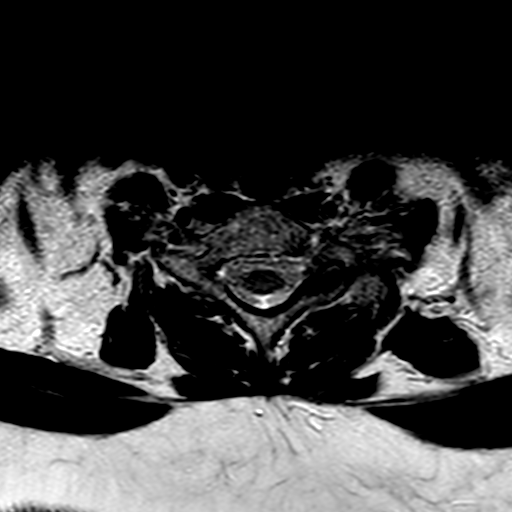
[im 9/30]
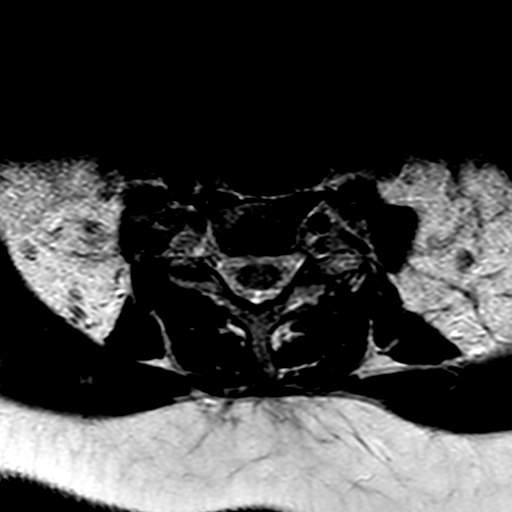
[im 16/30]
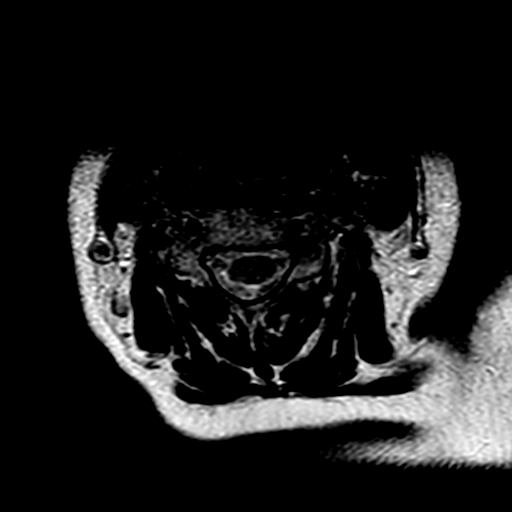
[im 26/30]
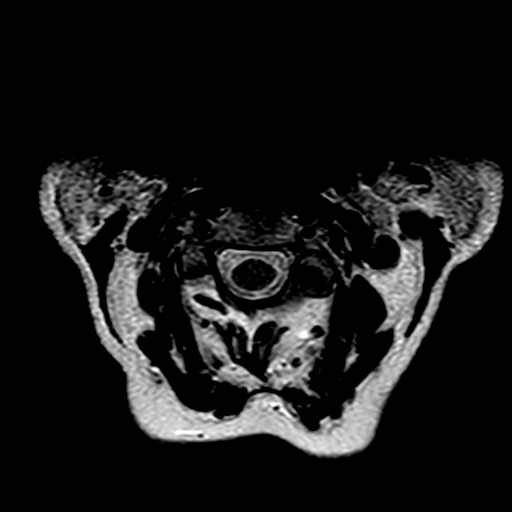

[26 of 48 positions shown; findings below may reference images not displayed]

FINDINGS: No vertebral body fracture. 10 degrees cervical dextrocurve. No 
spondylolisthesis. 0.9 cm C6 vertebral body hemangioma. No signal abnormality or 
mass within the included portion of the spinal cord or spinal canal. The 
cerebellar tonsils are well located. The foramen magnum is negative. Included 
portions of the intracranial contents are negative. Posterior paraspinal soft 
tissues are negative. 
C2-C3: The disc is normal in height and signal. No disc herniation. Normal 
facets. No spinal canal or neural foraminal stenosis. 
C3-C4: The disc is normal in height and signal. No disc herniation. Normal 
facets. No spinal canal or neural foraminal stenosis. 
C4-C5: The disc is normal in height and signal. No disc herniation. Normal 
facets. No spinal canal or neural foraminal stenosis. 
C5-C6: The disc is normal in height and signal. No disc herniation. Normal 
facets. No spinal canal or neural foraminal stenosis. 
C6-C7: The disc is normal in height and signal. No disc herniation. Normal 
facets. No spinal canal or neural foraminal stenosis. 
C7-T1: The disc is normal in height and signal. No disc herniation. Normal 
facets. No spinal canal or neural foraminal stenosis.
IMPRESSION: 1. No stenosis or disc herniation. 
2. Mild cervical dextrocurvature.

## 2019-12-31 IMAGING — MR MRI THORACIC SPINE WITHOUT CONTRAST
4 of 6 series · 13 of 48 positions shown · IV contrast (gadolinium)
Comparison: 12/13/2019 [facility] radiographs

MRI THORACIC SPINE WITHOUT CONTRAST, MRI LUMBAR SPINE WITHOUT CONTRAST, 
12/31/2019 [DATE]: 
CLINICAL INDICATION: Evaluate for stenosis or disc herniation. Right-sided back 
pain since June.
TECHNIQUE: Multiplanar, multiecho position MR images of the thoracic and lumbar 
spine were performed without intravenous gadolinium enhancement.

[Series 803: T1 · sagittal · 5.5mm · 0.66mm/px · 4 of 15 slices shown]
[im 1/15]
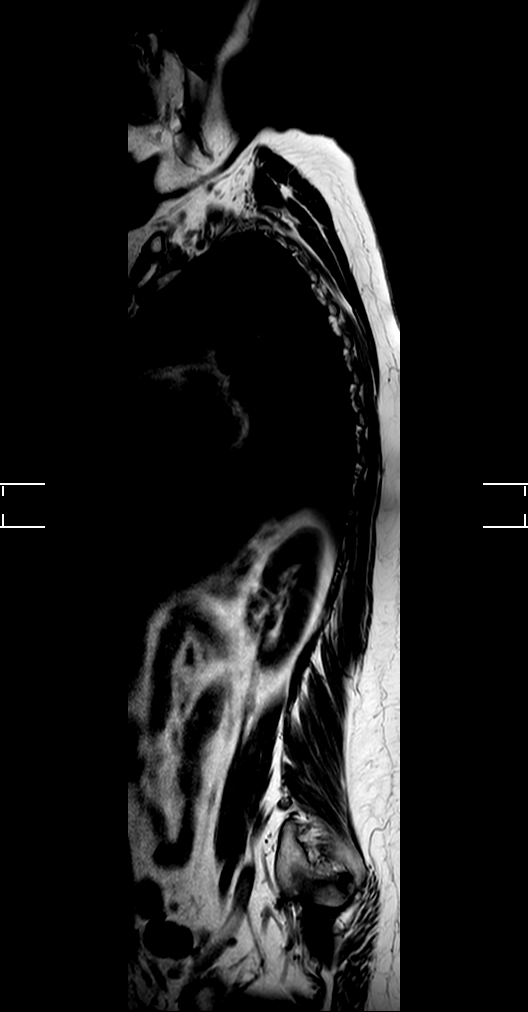
[im 4/15]
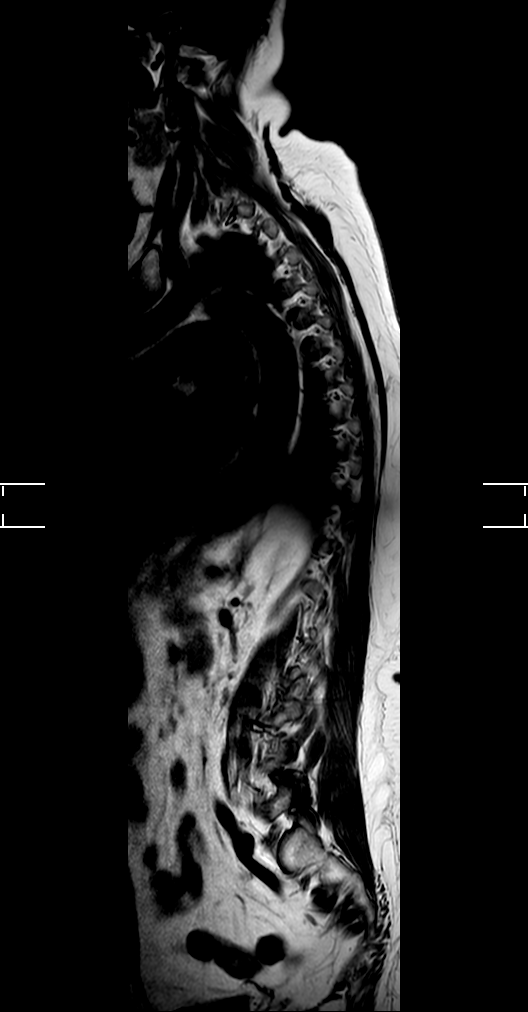
[im 8/15]
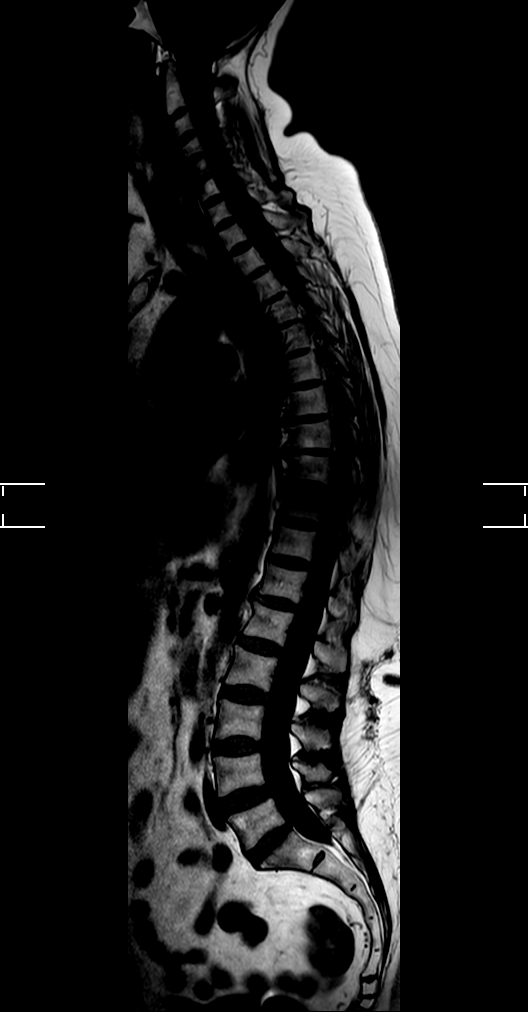
[im 15/15]
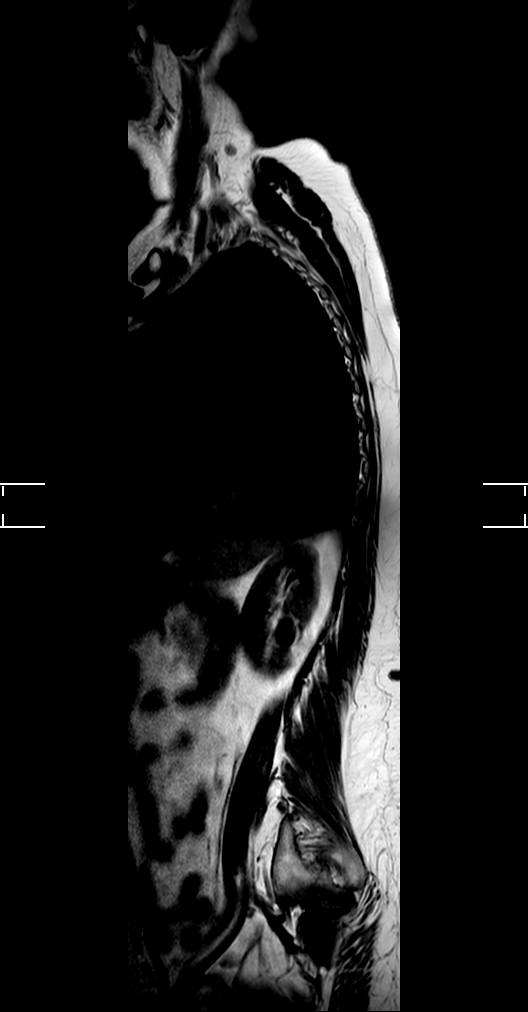

[Series 901: t1_tse_sag · sagittal · 3.0mm · 0.62mm/px · 3 of 21 slices shown]
[im 4/21]
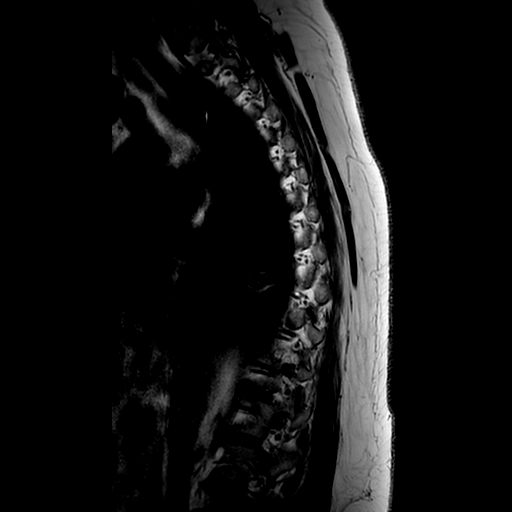
[im 11/21]
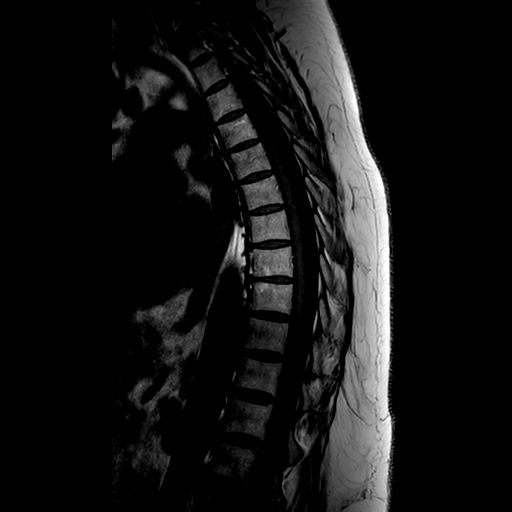
[im 17/21]
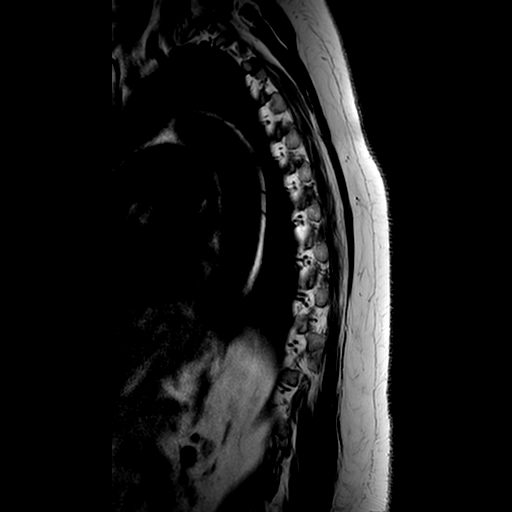

[Series 1001: t2_tse_sag · sagittal · 3.0mm · 0.62mm/px · 3 of 21 slices shown]
[im 4/21]
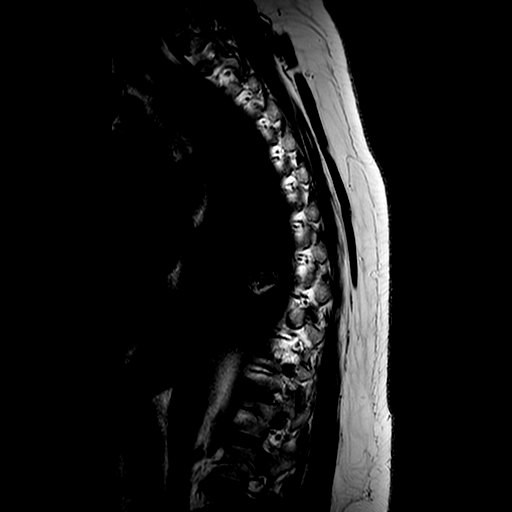
[im 11/21]
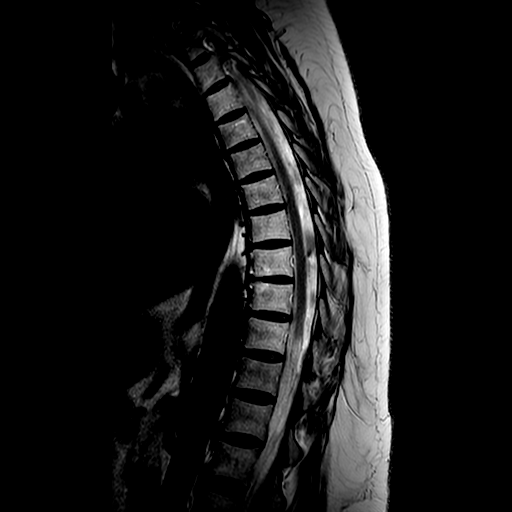
[im 17/21]
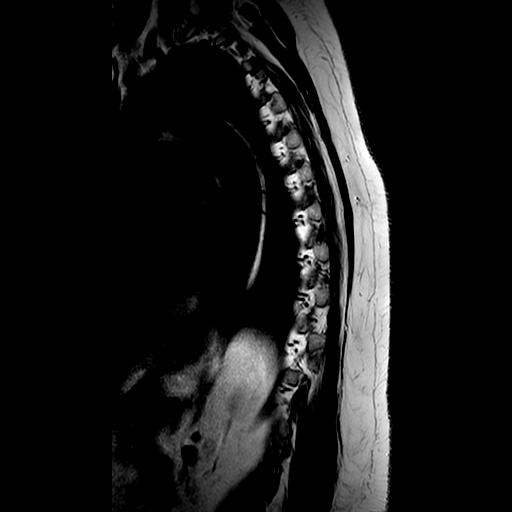

[Series 1101: stir_sag · sagittal · 3.0mm · 0.62mm/px · 3 of 21 slices shown]
[im 4/21]
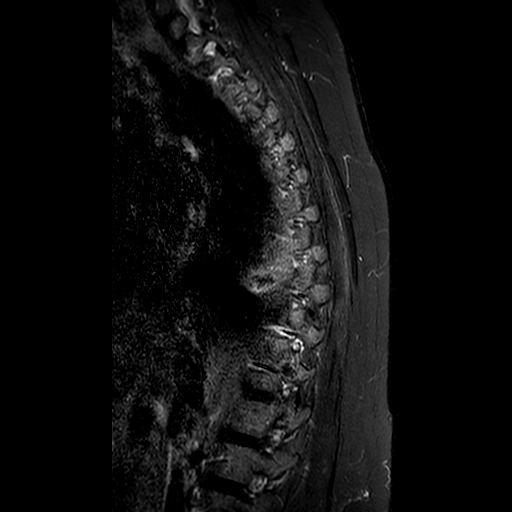
[im 11/21]
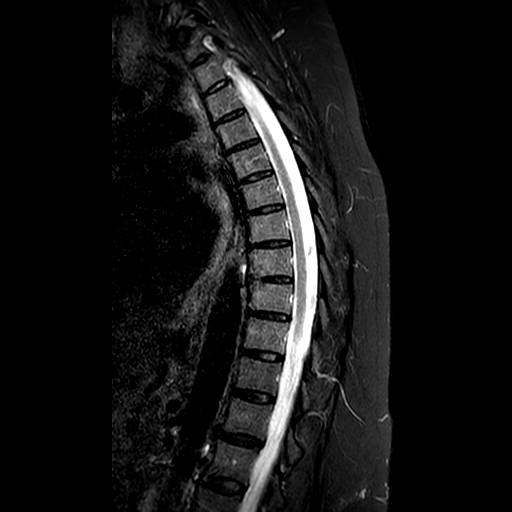
[im 17/21]
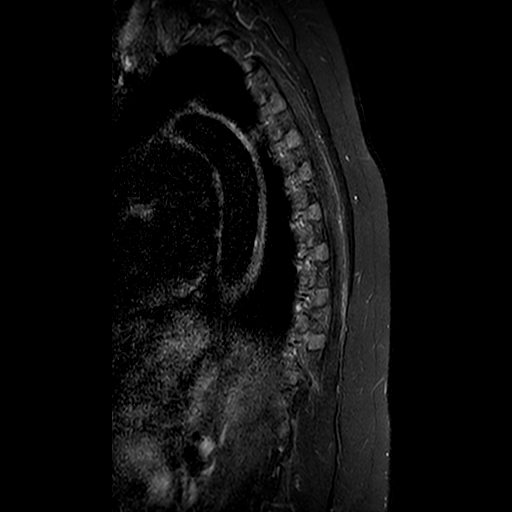

[13 of 48 positions shown; findings below may reference images not displayed]

FINDINGS: VERTEBRA: The vertebral bodies are normal in height and alignment. No acute 
vertebral body fracture. No spondylolisthesis. Small marginal osteophytes, most 
marked at T7-9 and T12-L1. Small L5 inferior endplate Schmorl node. Type II 
Modic changes at L5-S1. Mild thoracic levocurvature. No fractures or marrow 
replacing lesions. No central canal, lateral recess or neural foraminal 
stenosis. 
SPINAL CORD: Spinal cord is normal in caliber and signal intensity. Conus 
medullaris is at the level of L1-2. 
THORACIC DISCS: Multilevel degenerative disc disease, most marked at T7-8 and 
T8-9. Small T11-12 broad-based central disc protrusion measures 2 mm in AP 
dimensions. 
T12-L1: Small broad-based central disc protrusion measures 3 mm in AP 
dimensions. Mild disc space narrowing and disc desiccation. No disc herniation. 
Normal facets. No spinal canal or neural foraminal stenosis. 
L1-L2: The disc is normal in height and signal. No disc herniation. Normal 
facets. No spinal canal or neural foraminal stenosis. 
L2-L3: Small broad-based central disc protrusion measures 3 mm in AP dimensions. 
The disc is normal in height and signal. No disc herniation. Normal facets. No 
spinal canal or neural foraminal stenosis. 
L3-L4: The disc is normal in height and signal. No disc herniation. Mild facet 
arthropathy. No spinal canal or neural foraminal stenosis. 
L4-L5: Annular tear in the left subarticular region. The disc is normal in 
height with disc desiccation. No disc herniation. Moderate left and mild right 
facet arthropathy. No spinal canal or neural foraminal stenosis. 
L5-S1: Mild disc space narrowing and disc desiccation. No disc herniation. Mild 
facet arthropathy. No spinal canal or neural foraminal stenosis. 
OTHER: The aorta is normal in diameter. The posterior paraspinal soft tissues 
are negative.
IMPRESSION: 1. Multilevel degenerative change without stenosis. 
2. Small central broad-based disc protrusions at T11-12, T12-L1 and L2-3. 
3. Annular tear in the L4-5 left subarticular region. 
4. Mild thoracic levocurvature.

## 2021-05-28 NOTE — Unmapped (Signed)
Called Ms Sermons, left a message on her voice mail requesting a return call

## 2021-05-28 NOTE — Unmapped (Signed)
Spoke with patient about her flights. She could either fly in on June 8th/9th, or June 22nd/23rd.     Dr. Ernst Breach is out of the office June 8th and 9th.     Gave her an appointment with Dr. Ernst Breach on June 22nd at 8:00 a.m.

## 2021-05-28 NOTE — Unmapped (Signed)
Ms Sarah Huang returned my call requesting to schedule an appt with Dr Ernst BreachHeelan and/or Medical Oncologist for cancer treatment.  Said she had CT Scan and imaging done at [facility] in February 2023 (she is having imaging/biopsy reports faxed to [phone]).  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 [place] with [place] breast [place] in [place] on 06/24/21.  She prefers being treated at [facility].  Offered her an appointment with Dr Ernst BreachHeelan on 06/03/21 @ 11a.

## 2021-06-03 ENCOUNTER — Ambulatory Visit

## 2021-06-09 NOTE — Unmapped (Signed)
Lori from [facility] said she reached out to outside facility and they will send slides back to them Monday 7/10. She asked me to call back on Wednesday to check if they have slides and they will send slides right to [facility].

## 2021-06-09 NOTE — Unmapped (Signed)
Contacted [facility]  Radiology fax: [phone]  Ph:[phone]   Imaging reports requested - 5/31   Imaging reports Received - 6/21   Imaging pushed into [employer] - 6/18   Import sheet and reports taken to mammography to upload imaging into patients chart - 6/21      Path[facility]   Ph: [phone]   Left voicemail for someone to return my call with information on whwere I should send a request for pathology slides.   Pathology Report requested - 5/31  Pathology Report received - 5/31  Pathology slides requested - 5/31  Pathology slides received/delivered via fedex to Tori in surgical pathology- 7/19      Records successfully faxed to scanning   Imaging reports - 6/19  Pathology Reports - 6/21

## 2021-06-09 NOTE — Unmapped (Signed)
Amy at [facility] - pathologist at other facility has not yet signed them out to send to [facility] then send to [place]. Will follow up next week.

## 2021-06-09 NOTE — Unmapped (Unsigned)
Spoke to a lady who said the slides have been sent out to another facility. I will send a request and follow up with them in a few weeks. All questions answered.     [facility]   Ph: [phone]   Fax: [phone]     Request fax 5/31

## 2021-06-28 ENCOUNTER — Inpatient Hospital Stay: Admit: 2021-06-28

## 2021-06-30 ENCOUNTER — Inpatient Hospital Stay: Admit: 2021-06-30

## 2021-06-30 ENCOUNTER — Inpatient Hospital Stay: Payer: PRIVATE HEALTH INSURANCE

## 2021-06-30 DIAGNOSIS — C50911 Malignant neoplasm of unspecified site of right female breast: Secondary | ICD-10-CM

## 2021-07-01 ENCOUNTER — Inpatient Hospital Stay: Admit: 2021-07-01 | Payer: PRIVATE HEALTH INSURANCE

## 2021-07-01 ENCOUNTER — Inpatient Hospital Stay: Admit: 2021-07-01 | Payer: MEDICARE

## 2021-07-01 ENCOUNTER — Ambulatory Visit: Admit: 2021-07-01 | Discharge: 2021-07-01 | Payer: PRIVATE HEALTH INSURANCE

## 2021-07-01 DIAGNOSIS — C50911 Malignant neoplasm of unspecified site of right female breast: Secondary | ICD-10-CM

## 2021-07-01 NOTE — Unmapped (Signed)
RN CHART NOTE    MRI_________    Genetics___________    Plastics_____________    CPC _________    Hem/Onc___________    Rad/Onc____________    Tentative surgery date ___________    Prehab (for any pt moving forward with mastectomy or possible ALND) __________________

## 2021-07-01 NOTE — Unmapped (Signed)
CHIEF COMPLAINT:  Recurrent breast cancer in axillary lymph node     HISTORY OF PRESENT ILLNESS:  Sarah Huang is a 66 y.o. woman who has a hisory of right breast invasive ductal carcinoma ER+ , PR+, HER2 negative. She had bilateral skin sparing mastectomies, right SLNB by Dr. Melvyn Neth with immediate reconstruction with bilateral TE by Dr. Jens Som in June of 2015.     Patient states she had a whole body scan gifted by her children. Then, as a result of concerning findings, she underwent PET-CT and imaging done at [facility] in February 2023 where she reports an area in her right reconstructed breast, right axilla, and tonsil were detected. She underwent further work up with [place] and biopsy of the breast lesion with pathology demonstrating recurrent IDC ER/PR pos, HER2 negative. The right axillary node was not biopsied due to concerns about vascularity. She saw an ENT who performed an upper scope twice, once before and once after a course of antibiotics, and they did not feel that she needed further work up. Regarding her breast lesion, she prefers to have her care at [facility] so she has flown here for consultation.    Since she had the scans and subsequent biopsy, she is able to palpate the recurrence and believes it is at the site of her original core biopsy. She does not notice significant growth. She cannot palpate anything in her axilla. She is generally happy with her implant based reconstruction but wouldn't mind excision of the angel wings laterally. She has no other systemic complaints and otherwise feels well today.    Breast History:     HPI Breast History:   Race: caucasian   Ethnicity: No Ashkenazi [place], [place], or Amish ancestry   Age of Menarche:15   LMP: December 2002   Gravida 3 Para 2 Misc 1   Age at first childbirth:21   History of breast feeding: no   History of hormonal contraceptives:yes about 4 years   Age of Menopause: Total hysterectomy at 46   History of HRT: yes, 3  months premarin   Assisted Reproduction Treatments: no   Hereditary risk factors (pre-breast biopsy): no   Prior history of breast biopsy: 05-10-13     Family history of breast or ovarian cancer: yes   Grandparents: no  Parents/Aunts/Uncles: no paternal aunt diagnosed with breast cancer age unknown  Siblings: no   Children: no   paternal cousin diagnosed with breast cancer at age 98     Family history of other cancers: yes   Grandparents: no   Parents/Aunts/Uncles: yes maternal aunt diagnosed with colon cancer at age 28, maternal aunt with pancreatic cancer age 50  Siblings: no   Children:no   3 generation family history obtained: yes       Patient had genetic testing in the past:Yes  Family history of genetic testing and results: No  Patient interested in future childbearing: no  Patient interested in meeting with a fertility specialist: no    Signs/Symptoms/Duration of chief complaint: Recurrent cancer      Past Medical History:   Diagnosis Date   ??? Cancer (CMS-HCC) 06/2013    (R) breast   ??? Constipation 06/17/2013    IIBS   ??? Depression    ??? Dermatitis    ??? Dyslipidemia    ??? GERD (gastroesophageal reflux disease)    ??? Hyperlipidemia    ??? Kidney stones    ??? Psoriasis    ??? Sleep apnea 06/17/2013  not using C-PAP   ??? Urinary tract infection      Past Surgical History:   Procedure Laterality Date   ??? BREAST SURGERY  06/19/13    immediate bilateral TE   ??? COLONOSCOPY N/A 05/29/2012    Procedure: COLONOSCOPY W/ OR W/O BIOPSY;  Surgeon: Langley Adie, MD;  Location: [facility];  Service: Gastroenterology;  Laterality: N/A;  limited colonoscopy.   ??? COLONOSCOPY N/A 06/27/2012    Procedure: COLONOSCOPY W/ OR W/O BIOPSY;  Surgeon: Langley Adie, MD;  Location: [facility];  Service: Gastroenterology;  Laterality: N/A;  colonoscopy   ??? DISSECTION AXILLARY Right 06/19/2013    Procedure: -POSS RIGHT AXILLARY NODE DISSECTION;  Surgeon: Cristal Generous, MD;  Location: [facility] OR;  Service: General;  Laterality: Right;   ???  ESOPHAGOGASTRODUODENOSCOPY N/A 05/29/2012    Procedure: EGD;  Surgeon: Langley Adie, MD;  Location: [facility];  Service: Gastroenterology;  Laterality: N/A;   ??? HYSTERECTOMY  0220    Hysty/BSO 2004-etopic pregnancy/prolapse   ??? mid urethral sling   2002    at time of [facility]   ??? REMOVE BREAST TISSUE EXPANDERS INSERTION IMPLANTS Bilateral 06/19/2013    Procedure: Dr. Jens Som at 1:30pm-placement of bilateral tissue expanders;  Surgeon: Yolanda Manges, MD;  Location: [facility] OR;  Service: Plastics;  Laterality: Bilateral;   ??? REMOVE BREAST TISSUE EXPANDERS INSERTION IMPLANTS Bilateral 11/26/2013    Procedure: EXCHANGE BILATERAL TISSUE EXPANDER FOR IMPLANT;  Surgeon: Jonita Albee, MD;  [facility];  Service: Plastics;  Laterality: Bilateral;   ??? SALPINGOOPHORECTOMY  2004    Hysty/BSO-etopic pregnancy/prolapse   ??? SINUS SURGERY      hx sinus surg x3   ??? TONSILLECTOMY      in the past     Prior to Admission medications    Medication Sig Start Date End Date Taking? Authorizing Provider   cetirizine (ZYRTEC) 10 MG tablet Take 10 mg by mouth daily.    Historical Provider, MD   cyanocobalamin, vitamin B-12, 1,000 mcg Subl Dissolve one tablet under tongue daily 01/31/19   Marjory Sneddon, MD   ergocalciferol (ERGOCALCIFEROL) 1,250 mcg (50,000 unit) capsule TAKE 1 CAPSULE BY MOUTH 1 TIME A WEEK 07/27/18   Marjory Sneddon, MD   ibuprofen (MOTRIN) 800 MG tablet Take 1 tablet (800 mg total) by mouth every 8 hours as needed. 11/09/18   Marjory Sneddon, MD   omeprazole (PRILOSEC) 40 MG capsule TAKE 1 CAPSULE(40 MG) BY MOUTH EVERY MORNING BEFORE BREAKFAST 01/30/19   Marjory Sneddon, MD   SUMAtriptan (IMITREX) 100 MG tablet Take one tablet po daily as needed for migraine, may repeat in 2 hours if needed in 24 hours. 08/03/17   Marjory Sneddon, MD     Allergies   Allergen Reactions   ??? Crestor [Rosuvastatin] Other (See Comments)     Causes tightening in leg muscles.     Social History     Socioeconomic History   ??? Marital  status: Divorced     Spouse name: Not on file   ??? Number of children: Not on file   ??? Years of education: Not on file   ??? Highest education level: Not on file   Occupational History   ??? Not on file   Tobacco Use   ??? Smoking status: Former     Types: Cigarettes     Quit date: 11/11/1989     Years since quitting: 31.6   ??? Smokeless tobacco: Never   ??? Tobacco comments:  quit 1991   Substance and Sexual Activity   ??? Alcohol use: No     Comment: Rare    ??? Drug use: No     Comment: 05-22-17   ??? Sexual activity: Never   Other Topics Concern   ??? Caffeine Use Yes   ??? Occupational Exposure No   ??? Exercise No   ??? Seat Belt Yes   Social History Narrative   ??? Not on file     Social Determinants of Health     Financial Resource Strain: Not on file   Physical Activity: Not on file   Stress: Not on file   Social Connections: Not on file   Housing Stability: Not on file     Family History   Problem Relation Age of Onset   ??? Diabetes Mother    ??? Hypertension Mother    ??? Coronary artery disease Mother    ??? Heart disease Mother         Triple Bypass   ??? COPD Mother    ??? Hyperlipidemia Mother    ??? Coronary artery disease Father    ??? Hyperlipidemia Father    ??? COPD Father    ??? Heart failure Father         CHF   ??? Emphysema Father    ??? Other Sister         Blood Disorder   ??? COPD Sister    ??? Psoriasis Sister    ??? Heart attack Brother    ??? COPD Brother    ??? Heart disease Brother         1 brother -Triple Bypass   ??? Other Brother         Heart Defect, Open heart surgery   ??? Psoriasis Brother    ??? Breast Cancer Other    ??? COPD Other    ??? Breast Cancer Other    ??? Breast Cancer Paternal Aunt    ??? Eczema Neg Hx    ??? Melanoma Neg Hx        ROS:The review of systems was completed and is as documented by the MA.  All remainder ROS is negative.       PHYSICAL EXAMINATION:    There were no vitals filed for this visit.    GENERAL:  She is a well-developed, well-nourished woman in no acute distress.    HEENT:  Within normal limits.  NECK: Neck is  supple without lymphadenopathy or thyromegaly.  LUNGS:  Normal effort on RA  HEART:  Regular.  ABDOMEN:  Soft without organomegaly.  EXTREMITIES: Warm without edema.   NEUROLOGICAL: She is alert and oriented x 3.    BREAST EXAMINATION: Bilateral reconstructed breast mounds, implant based, with healed ssm scars. Right breast with ~1cm hard nodule about 2cm superior to scar at mid breast, not mobile, involving skin. Right axilla without palpable LAD. No left breast nodules, no LAD bilaterally.      Imaging:      IMAGING: I personally reviewed all of the internal and outside imaging today.    Pathology:         PATHOLOGY:  I reviewed the pathology with her today as well.      Cancer Staging:   Cancer Staging   Malignant neoplasm of right female breast (CMS-HCC)  Staging form: Breast, AJCC 7th Edition  - Clinical: Stage IA (T1b, N0, cM0) - Signed by Cristal Generous, MD on 05/17/2013  - Pathologic: Stage IA (T1a, N0(i-),  cM0) - Signed by Cristal Generous, MD on 07/02/2013         IMPRESSION/RECOMMENDATION:  Sarah Huang is a 66 y.o. woman who presents today for a consultation regarding her new diagnosis of  Recurrent IDC in the right breast. She has a ER pos, PR pos, HER2N neu, rcT1c, cNX, cM0,  breast cancer. I reviewed the clinical, imaging, and pathology findings with her today.  We discussed the treatment of breast cancer, which includes a combination of surgical therapy, systemic therapy, and radiation.  We discussed her surgical therapy in great detail, and I explained the difference between breast conservation and mastectomy.      We then went on to discuss local therapy in further detail. We discussed the options of breast conservation versus mastectomy with or without reconstruction. She understands there is overall equivalence of breast conserving therapy and mastectomy. She understands that radiation is recommended following segmental mastectomy.    Together with the patient we reviewed available imaging and pathology  and discussed the following:    1. Surgical treatment of the breast primary:  We discussed local excision of the recurrence.    Lymph node management: Will have her undergo right axillary ultrasound and possible biopsy if our radiologists feel this is safe. Recommended excision of this questionably PET-avid node in addition to attempt at repeat SLNbx. Should any of the nodes be positive, we discussed that ALND would likely be recommended.    2. Systemic therapy:   We spoke briefly about adjuvant therapies which may include chemotherapy, immunotherapy, and/or endocrine therapy.   Will refer after surgery. She took tamoxifen x1 year and stopped due to side effects.    3. Radiation therapy:   Briefly discussed indications for radiation including breast conservation and post-mastectomy radiation therapy as well as potential duration of treatment.  Will refer after surgery, if indicated.    4. Plastic and reconstructive surgery: She has had prior implant based recon with Dr. Jens Som. She would like excision of the axillary fat pads- discussed that we could try to coordinate this with oncologic resection but that it may not be possible. Referred to Dr. Jens Som.    5. Systemic staging studies not indicated at this time.  -Will have OSH PET-CT overread    6. Imaging - her imaging and reports have been reviewed.  -Staging breast MRI not indicated, and has not been ordered    7. Genetics   - she does meet ASBrS criteria for referral to genetics.  - she does meet NCCN criteria for referral to genetics.  - Prior genetics without mutation.    8. Fertility/menopausal status  - she is post-menopausal.  - she is not on hormonal contraceptives/hormone replacement  - she is not interested in referral to reproductive endocrinology for further discussion of fertility preservation options.    9. Pain    No data recorded  She denies presence of pain.  Pain will be reassessed at next visit.      10. Smoking-She is a  non-smoker    11. Follow up - After axillary US/poss bx for preop

## 2021-07-01 NOTE — Unmapped (Signed)
Review of Systems   All other systems reviewed and are negative.

## 2021-07-01 NOTE — Unmapped (Addendum)
Surg-Rad Planning Note:    Surgical plan: Right axillary Smartclip localized partial mastectomy, right axillary sentinel lymph node biopsy, possible axillary lymph node dissection       Lesion: laterality  right  Location: Axilla   Description: Borderline node (asymmetry, calcs, mass)   Size: n/a   Marker: None   Pathology: not yet biopsied   Type: SC (SmartClip, Needle/Wire)  If SmartClip - are there any contraindications (pregnancy, pacemaker, AICD) to SmartClip placement?: No  Modality: [place]  Bracketed: No  Number of wires/SmartClips: 1  Additional notes: Not yet biopsied, but will resect regardless of path as this is a recurrence. Can place smartclip instead or in addition to biopsy clip. Breast recurrence will be palpation guided.      This plan was reviewed by Dr. Ernst Breach and discussed with Dr. Lauris Chroman on 07/01/21. The performing radiologist, Dr. Maree Erie, reviewed the plan and did not make any changes to the plan.

## 2021-07-02 ENCOUNTER — Inpatient Hospital Stay: Admit: 2021-07-02 | Discharge: 2021-07-07 | Payer: PRIVATE HEALTH INSURANCE | Attending: Diagnostic Radiology

## 2021-07-02 DIAGNOSIS — R928 Other abnormal and inconclusive findings on diagnostic imaging of breast: Secondary | ICD-10-CM

## 2021-07-02 DIAGNOSIS — R590 Localized enlarged lymph nodes: Secondary | ICD-10-CM

## 2021-07-06 NOTE — Unmapped (Signed)
Pt calling as she believes she received two calls yesterday and 2 today as well.  She is asking if this is for her biopsy results.

## 2021-07-06 NOTE — Unmapped (Signed)
Patient left [place] a voicemail message and stated that she was returning our call concerning her pathology results. I did email Dr. Maree Erie and asked him to call her at [phone].

## 2021-07-06 NOTE — Unmapped (Signed)
Per chart review it looks like Dr. Maree Erie of radiology had been attempting to reach patient.

## 2021-07-07 NOTE — Unmapped (Signed)
Returning patient call.     Yes, FMLA paperwork has been received.     Patient anticipating needing to take a few weeks off work. Her company has her down for 8 weeks, just in case she needs to have intermittent follow up appointments. When surgery is finalized we will lock in continuous dates needed off and then some other possible intermittent dates due to patient living in [place].

## 2021-07-07 NOTE — Unmapped (Signed)
Pt is calling for [place]. She wants to know if her FMLA papers were received.

## 2021-07-08 NOTE — Unmapped (Signed)
Completed FMLA sent successfully via fax as requested by patient.

## 2021-07-16 MED ORDER — lidocaine (PF) 2% (20 mg/mL) Soln 20 mg
20 | Freq: Once | INTRAMUSCULAR | Status: AC | PRN
Start: 2021-07-16 — End: 2021-07-16

## 2021-07-20 NOTE — Unmapped (Addendum)
07/16/21 0002   Pre-op Phone Call   Surgery Time Verified Yes  (08/02/21.)   Arrival Time Verified   (Arrival time to be given by Dr. Venita Sheffield office.)   Surgery Location Verified Yes  [facility])   Remind patient to bring picture ID and insurance card Yes   Medical History Reviewed Yes   NPO Status Reinforced Yes  (NPO 8 hours prior to arrival time.)   Ride and Caregiver Arranged Yes   Ride Caregiver Provider Ambera, Fedele (Daughter)   [phone] (Home Phone)   Instructions to bring current medication list No     Reviewed Pre-Op instructions with patient. No hx of problems with anesthesia. Reviewed fasting guidelines.Avoid NSAIDS including ASA, Advil, Ibuprofen, Aleve, or Naproxen 7 days prior to surgery or if less than 7 days, hold from now until procedure. Ok to take Acetaminophen. Do not take a Multivitamin, Vitamin E, or Herbal Supplements two weeks prior to surgery (If the surgery date is less than 2 weeks, please stop taking from now until the surgery date). Reviewed med list. Instructed to take Omeprazole and Cetirizine with small sips of water the morning of surgery. Hx of OSA but does not use CPAP. Shower the evening before and the morning of the procedure using an antibacterial soap such as dial or safeguard. Patient voiced understanding of all instructions. My Chart message sent with preop instructions.

## 2021-07-20 NOTE — Unmapped (Signed)
Tumor Board        Risk Factors include:     Race: caucasian   Ethnicity: No Ashkenazi [place], [place], or Amish ancestry   Age of Menarche:15   LMP: December 2002   Gravida 3 Para 2 Misc 1   Age at first childbirth:21   History of breast feeding: no   History of hormonal contraceptives:yes about 4 years   Age of Menopause: Total hysterectomy at 46   History of HRT: yes, 3 months premarin   Assisted Reproduction Treatments: no   Hereditary risk factors (pre-breast biopsy): no   Prior history of breast biopsy: 05-10-13     Clinical Stage:    Family History:   paternal aunt diagnosed with breast cancer,  paternal cousin diagnosed with breast cancer at age 14, maternal aunt diagnosed with colon cancer at age 37, maternal aunt with pancreatic cancer age 51      Imaging:  See attached document.    Pathology:  See attached document.    Pathology Stage:     Recommendations:    Surg/Onc: lumpectomy, ex of clipped lymph node, attempt to do repeat SLNB  Med/Onc: endocrine +/- CDK4/6 inhibitors   Rad/Onc: right chest wall and regional nodes   Genetics: per patient was completed with previous breast cancer in 2015 and reports negative results    Imaging/Radiology: NA  Oncofertility: N/A   Clinical Trials N/A   Reconstruction: N/A   Oncotype: N/A   Integrative Onc: N/A     Attendees:  See sign in sheet.    The pathology slides were reviewed by the Pathologist/Pathology Department prior to the first couse of treatment at this institution and at Tumor Board.  The review is documented in the medical record under the Pathology.    The patient's breast imaging was reviewed by the Radiologist/Radiology Department prior to the first course of treatment at this institution and at Tumor Board.  The review is documented in the medical record under the Imaging.    *This document represents the recommendations of the practitioners in attendance as of the date of this conference.  These recommendations do not imply a patient consultation,  nor do they dictate an agreed treatment plan, but rather are to be used as a resource for practitioners when determining the final treatment plan.  The responsibility for the final treatment plan and the ultimate standard care remains with the treating physicians.

## 2021-07-20 NOTE — Unmapped (Signed)
error 

## 2021-07-22 ENCOUNTER — Ambulatory Visit: Admit: 2021-07-22 | Discharge: 2021-07-22 | Payer: PRIVATE HEALTH INSURANCE

## 2021-07-22 DIAGNOSIS — Z01818 Encounter for other preprocedural examination: Secondary | ICD-10-CM

## 2021-07-22 NOTE — Unmapped (Signed)
[facility]    Patient: Sarah Huang Age: 66 y.o.  MRN: [id number]       History of Present Illness:  Sarah Huang is a 66 y.o. woman who presents today for a consultation recurrent IDC ER/PR pos, HER2 negative.    Since last visit patient has undergone biopsy and smart clip placement of right axillary lymph node. Patient presents for surgical discussion and planning for right breast lumpectomy, lymph node biopsy and excision of clipped lymph node, possible axillary lymph node dissection on 08/02/21.       Reason for Assessment:  Pre-operative evaluation for a patient with recently diagnosed right breast local recurrence of IDC. She has completed work up and presents to finalize surgical plan.        Oncology History    No history exists.       Diagnostic Imaging:  Mammography Outside Film Review    Result Date: 07/01/2021  Outside film review performed on 07/01/2021 12:37 PM EDT. Indication: Patient underwent bilateral mastectomies in 2015 with reconstruction. She had breast imaging March 29, 2021, in [place], for abnormality seen on PET/CT. Additional breast imaging confirmed an area of probable recurrence with skin involvement, and this was found to be invasive ductal carcinoma after percutaneous biopsy. On the subsequent breast imaging, a mass in the upper outer reconstructed right breast was seen and also thought to be mildly suspicious, although a biopsy was not performed because of vascularity Imaging provided: *  PET/CT February 17, 2021 *  Breast MRI April 08, 2021 *  Ultrasound April 20, 2021 *  Ultrasound-guided core biopsy and postbiopsy mammogram May 19, 2021 Breast Density: The reconstructed breast tissue is predominantly fatty. Findings/Discussion: MRI *  There are intact silicone implants bilaterally *  MRI-skin enhancement and irregular enhancement extending deeper into the reconstructed right breast, axial location H 49.1. *  Skin enhancement extends approximately 1.5 cm  transversely and 1 cm sagittally, along the skin surface posterior extent is approximately 12 mm *  Enhancement extends approximately 2 to 3 mm from the shell of the implant *  Approximately 9 mm mass centered on axial position H 28.1 is likely a small lymph node and is borderline abnormal No axillary lymphadenopathy or other significant findings ULTRASOUND: *  Both lesions were found on ultrasound *  The mass involving the skin is seen at 1:00. *  There is skin thickening, with abnormal enhancement extending to a irregular hypoechoic mass with a surrounding thick echogenic rim of tissue. This extends to close to the implant shell. This measures approximately 12 mm *  The mass within the upper outer right breast is likely a lymph node and is labeled to be at 9:00 and measures approximately 7 to 8 mm in maximum dimension. It is multilobulated with borderline cortical thickness of 3 mm Ultrasound-guided core biopsy and post biopsy mammogram *  The mass involving the skin was biopsied. The tissue marker is adjacent to thickened skin. *  A small mass is visible within the upper outer reconstructed right breast, which likely is the mass seen on mammogram and MRI. *  The borderline abnormal lymph node was not biopsied     Ultrasound was done to reevaluate the probable lymph node in the upper outer right breast in preparation for biopsy and subsequent excision. This will be reported separately Recommendation: Surgical treatment ACR BI-RADS Category: 6 (Known Biopsy Proven Malignancy) Report Verified by: Kasandra Knudsen, MD at 07/01/2021 1:18 PM EDT  Mammo US Biopsy Lymph Node(s) Right    Addendum Date: 07/12/2021    ** ADDENDUM #1** ADDENDUM: Pathology results from the ultrasound-guided core needle biopsy of the low-lying right axillary lymph node demonstrates: - Fragments of lymph node tissue, negative for carcinoma (pancytokeratin immunostain confirmatory). Please see final pathology report for additional details.   Results are benign and concordant with imaging. BI-RADS 2: Benign findings. RECOMMENDATION: Anticipated excision of this lymph node. A Green SmartClip was placed in the lymph node at the time of biopsy given the patient's recurrent ipsilateral breast cancer. NOTIFICATION: The results and recommendation were communicated by Dr. Dorie Rank and acknowledged by the patient via telephone on 07/06/2021 4:34 PM EDT. END OF ADDENDUM Report Verified by: Dorie Rank, MD at 07/06/2021 4:38 PM EDT** ORIGINAL REPORT ** Procedure: Ultrasound-guided right breast biopsy, right breast ultrasound-guided SmartClip localization, and digital postbiopsy mammograms on 07/02/2021 12:50 PM EDT. Indication: 66 year old female with history of right breast cancer in 2015 status post bilateral mastectomies with reconstruction. 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 Patient presents today for ultrasound-guided biopsy of a low-lying right axillary lymph node. The procedure was performed by: Dorie Rank, MD (Breast Imaging Attending) Procedure Note: The risks and technique of the procedure including tissue marker placement were discussed with the patient. Written, informed consent was obtained. A time out was performed prior to the procedure. The side and site of the procedure was confirmed and marked. Ultrasound again demonstrated a mildly eccentrically thickened lymph node at 9:00, 12 cm from the nipple. The skin of the breast was cleansed and draped in sterile fashion. Sterile technique was used. The skin and soft tissues were anesthetized with 3 mL of 1% lidocaine superficially and 3 mL of 1% lidocaine with epinephrine deeper. A 18-gauge Bard spring-loaded biopsy device with introducer was used. This was advanced to the lesion with direct sonographic guidance. 5 tissue samples were obtained. A Green Elucent SmartClip applicator was advanced to the target using direct sonographic  guidance. Once the applicator was in the appropriate position, the SmartClip was deployed under direct sonographic guidance. Manual pressure was held for 10 minutes with hemostasis obtained. Steri-Strips were  applied to the skin site. The tissue specimens were placed in formalin and sent to pathology. Postbiopsy digital mammograms were obtained. These demonstrate the Green SmartClip within the lymph node. There is no significant post-biopsy hematoma. The images were annotated and saved in PACS for reference in the operating room. A dry sterile dressing was applied. Post-biopsy instructions were reviewed with the patient. Written instructions were also provided. The patient was discharged from the department in good condition. IMPRESSION: Status post ultrasound-guided biopsy of a low-lying right axillary lymph node with subsequent placement of a Green SmartClip within the lymph node. An addendum will be made when the results are available. ACR BI-RADS Category:1000, pending pathology results. Report Verified by: Dorie Rank, MD at 07/02/2021 3:37 PM EDT    Addendum Date: 07/06/2021    ** ADDENDUM #1** ADDENDUM: Pathology results from the ultrasound-guided core needle biopsy of the low-lying right axillary lymph node demonstrates: - Fragments of lymph node tissue, negative for carcinoma (pancytokeratin immunostain confirmatory). Please see final pathology report for additional details.  Results are benign and concordant with imaging. BI-RADS 2: Benign findings. RECOMMENDATION: Anticipated excision of this lymph node. A Green SmartClip was placed in the lymph node at the time of biopsy given the patient's recurrent ipsilateral breast cancer. NOTIFICATION: The results and recommendation were communicated by  Dr. Dorie Rank and acknowledged by the patient via telephone on 07/06/2021 4:34 PM EDT. END OF ADDENDUM Report Verified by: Dorie Rank, MD at 07/06/2021 4:38 PM EDT** ORIGINAL REPORT ** Procedure:  Ultrasound-guided right breast biopsy, right breast ultrasound-guided SmartClip localization, and digital postbiopsy mammograms on 07/02/2021 12:50 PM EDT. Indication: 66 year old female with history of right breast cancer in 2015 status post bilateral mastectomies with reconstruction. 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 Patient presents today for ultrasound-guided biopsy of a low-lying right axillary lymph node. The procedure was performed by: Dorie Rank, MD (Breast Imaging Attending) Procedure Note: The risks and technique of the procedure including tissue marker placement were discussed with the patient. Written, informed consent was obtained. A time out was performed prior to the procedure. The side and site of the procedure was confirmed and marked. Ultrasound again demonstrated a mildly eccentrically thickened lymph node at 9:00, 12 cm from the nipple. The skin of the breast was cleansed and draped in sterile fashion. Sterile technique was used. The skin and soft tissues were anesthetized with 3 mL of 1% lidocaine superficially and 3 mL of 1% lidocaine with epinephrine deeper. A 18-gauge Bard spring-loaded biopsy device with introducer was used. This was advanced to the lesion with direct sonographic guidance. 5 tissue samples were obtained. A Green Elucent SmartClip applicator was advanced to the target using direct sonographic guidance. Once the applicator was in the appropriate position, the SmartClip was deployed under direct sonographic guidance. Manual pressure was held for 10 minutes with hemostasis obtained. Steri-Strips were  applied to the skin site. The tissue specimens were placed in formalin and sent to pathology. Postbiopsy digital mammograms were obtained. These demonstrate the Green SmartClip within the lymph node. There is no significant post-biopsy hematoma. The images were annotated and saved in PACS for reference in the operating room.  A dry sterile dressing was applied. Post-biopsy instructions were reviewed with the patient. Written instructions were also provided. The patient was discharged from the department in good condition. IMPRESSION: Status post ultrasound-guided biopsy of a low-lying right axillary lymph node with subsequent placement of a Green SmartClip within the lymph node. An addendum will be made when the results are available. ACR BI-RADS Category:1000, pending pathology results. Report Verified by: Dorie Rank, MD at 07/02/2021 3:37 PM EDT    Result Date: 07/06/2021  Procedure: Ultrasound-guided right breast biopsy, right breast ultrasound-guided SmartClip localization, and digital postbiopsy mammograms on 07/02/2021 12:50 PM EDT. Indication: 66 year old female with history of right breast cancer in 2015 status post bilateral mastectomies with reconstruction. 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 Patient presents today for ultrasound-guided biopsy of a low-lying right axillary lymph node. The procedure was performed by: Dorie Rank, MD (Breast Imaging Attending) Procedure Note: The risks and technique of the procedure including tissue marker placement were discussed with the patient. Written, informed consent was obtained. A time out was performed prior to the procedure. The side and site of the procedure was confirmed and marked. Ultrasound again demonstrated a mildly eccentrically thickened lymph node at 9:00, 12 cm from the nipple. The skin of the breast was cleansed and draped in sterile fashion. Sterile technique was used. The skin and soft tissues were anesthetized with 3 mL of 1% lidocaine superficially and 3 mL of 1% lidocaine with epinephrine deeper. A 18-gauge Bard spring-loaded biopsy device with introducer was used. This was advanced to the lesion with direct sonographic guidance. 5 tissue samples  were obtained. A Green Elucent SmartClip applicator was advanced  to the target using direct sonographic guidance. Once the applicator was in the appropriate position, the SmartClip was deployed under direct sonographic guidance. Manual pressure was held for 10 minutes with hemostasis obtained. Steri-Strips were  applied to the skin site. The tissue specimens were placed in formalin and sent to pathology. Postbiopsy digital mammograms were obtained. These demonstrate the Green SmartClip within the lymph node. There is no significant post-biopsy hematoma. The images were annotated and saved in PACS for reference in the operating room. A dry sterile dressing was applied. Post-biopsy instructions were reviewed with the patient. Written instructions were also provided. The patient was discharged from the department in good condition.     Status post ultrasound-guided biopsy of a low-lying right axillary lymph node with subsequent placement of a Green SmartClip within the lymph node. An addendum will be made when the results are available. ACR BI-RADS Category:1000, pending pathology results. Report Verified by: Dorie Rank, MD at 07/02/2021 3:37 PM EDT     Mammo [place] Axilla Right    Addendum Date: 07/01/2021    ** ADDENDUM #1** On the current study, the ultrasound abnormality for which biopsy is recommended is labeled at 11:30, 5 cm from the nipple on the current study. In [place] it had been labeled 1:00 5 cm from the nipple, but this is the same lesion. Report Verified by: Kasandra Knudsen, MD at 07/01/2021 12:37 PM EDT** ORIGINAL REPORT ** Exam: Diagnostic right axillary ultrasound performed 07/01/2021 9:21 AM EDT. Indication: Patient underwent bilateral mastectomies in 2015 with reconstruction. She had breast imaging March 29, 2021, in [place], for abnormality seen on PET/CT. Additional confirmed an area of probable recurrence with skin involvement, and this was found to be invasive ductal carcinoma. Also on the PET/CT and subsequent breast imaging, a mass in the upper outer  reconstructed right breast was seen and also thought to be suspicious, although a biopsy was not performed because of vascularity Comparison: Outside MRI, ultrasound, biopsy images, post biopsy mammogram, were reviewed. Findings: Because this is a reconstructed breast, an attempt was made to a sign clock position and distance from nipple, but this is limited Limited imaging of the site of recurrence was performed. This is labeled at 1:00, 5 cm from nipple (previously labeled at 11:30, 5 cm from nipple,. Both the biopsy marker and the tumor are well seen There is an echogenic central portion with surrounding echogenic rim, likely spiculations, which appear to extend to the skin surface. Ultrasound of the suspected lymph lymph node shows it to be at 9:00, 12 cm from the nipple. This is multilobulated and measures up to 1 cm. The hilum appears to be preserved. Imaging of the right axilla itself shows no definite lymphadenopathy. IMPRESSION: Biopsy-proven recurrence in the right breast with skin involvement, is redemonstrated and is seen to contain the tissue marker The abnormal low lying right axillary lymph node is also redemonstrated and because of its multiple lobulations, is thought to be borderline abnormal. Recommendation: Definitive treatment of the invasive ductal carcinoma, suspected to be a recurrence Ultrasound-guided core biopsy of the borderline abnormal node probably within the low right axilla. These findings and recommendations were discussed with the patient in person and given to the patient in written form.  The patient was taken to the scheduler to schedule an appointment for the recommended procedure prior to leaving our department. BI-RADS 4 (suspicious) Although the patient does have biopsy-proven carcinoma in the right breast, this  category refers to today's ultrasound of the lymph node I have personally reviewed the images and I agree with this report. Report Verified by: Kasandra Knudsen, MD at  07/01/2021 10:33 AM EDT    Result Date: 07/01/2021  Exam: Diagnostic right axillary ultrasound performed 07/01/2021 9:21 AM EDT. Indication: Patient underwent bilateral mastectomies in 2015 with reconstruction. She had breast imaging March 29, 2021, in [place], for abnormality seen on PET/CT. Additional confirmed an area of probable recurrence with skin involvement, and this was found to be invasive ductal carcinoma. Also on the PET/CT and subsequent breast imaging, a mass in the upper outer reconstructed right breast was seen and also thought to be suspicious, although a biopsy was not performed because of vascularity Comparison: Outside MRI, ultrasound, biopsy images, post biopsy mammogram, were reviewed. Findings: Because this is a reconstructed breast, an attempt was made to a sign clock position and distance from nipple, but this is limited Limited imaging of the site of recurrence was performed. This is labeled at 1:00, 5 cm from nipple (previously labeled at 11:30, 5 cm from nipple,. Both the biopsy marker and the tumor are well seen There is an echogenic central portion with surrounding echogenic rim, likely spiculations, which appear to extend to the skin surface. Ultrasound of the suspected lymph lymph node shows it to be at 9:00, 12 cm from the nipple. This is multilobulated and measures up to 1 cm. The hilum appears to be preserved. Imaging of the right axilla itself shows no definite lymphadenopathy.     Biopsy-proven recurrence in the right breast with skin involvement, is redemonstrated and is seen to contain the tissue marker The abnormal low lying right axillary lymph node is also redemonstrated and because of its multiple lobulations, is thought to be borderline abnormal. Recommendation: Definitive treatment of the invasive ductal carcinoma, suspected to be a recurrence Ultrasound-guided core biopsy of the borderline abnormal node probably within the low right axilla. These findings and  recommendations were discussed with the patient in person and given to the patient in written form.  The patient was taken to the scheduler to schedule an appointment for the recommended procedure prior to leaving our department. BI-RADS 4 (suspicious) Although the patient does have biopsy-proven carcinoma in the right breast, this category refers to today's ultrasound of the lymph node I have personally reviewed the images and I agree with this report. Report Verified by: Kasandra Knudsen, MD at 07/01/2021 10:33 AM EDT     Mammo post procedure images Right    Addendum Date: 07/12/2021    ** ADDENDUM #1** ADDENDUM: Pathology results from the ultrasound-guided core needle biopsy of the low-lying right axillary lymph node demonstrates: - Fragments of lymph node tissue, negative for carcinoma (pancytokeratin immunostain confirmatory). Please see final pathology report for additional details.  Results are benign and concordant with imaging. BI-RADS 2: Benign findings. RECOMMENDATION: Anticipated excision of this lymph node. A Green SmartClip was placed in the lymph node at the time of biopsy given the patient's recurrent ipsilateral breast cancer. NOTIFICATION: The results and recommendation were communicated by Dr. Dorie Rank and acknowledged by the patient via telephone on 07/06/2021 4:34 PM EDT. END OF ADDENDUM Report Verified by: Dorie Rank, MD at 07/06/2021 4:38 PM EDT** ORIGINAL REPORT ** Procedure: Ultrasound-guided right breast biopsy, right breast ultrasound-guided SmartClip localization, and digital postbiopsy mammograms on 07/02/2021 12:50 PM EDT. Indication: 66 year old female with history of right breast cancer in 2015 status post bilateral mastectomies with reconstruction. Patient now with biopsy-proven recurrent  invasive ductal carcinoma of the right breast. Patient presents today for ultrasound-guided biopsy of a low-lying right axillary lymph node. The procedure was performed by: Dorie Rank, MD  (Breast Imaging Attending) Procedure Note: The risks and technique of the procedure including tissue marker placement were discussed with the patient. Written, informed consent was obtained. A time out was performed prior to the procedure. The side and site of the procedure was confirmed and marked. Ultrasound again demonstrated a mildly eccentrically thickened lymph node at 9:00, 12 cm from the nipple. The skin of the breast was cleansed and draped in sterile fashion. Sterile technique was used. The skin and soft tissues were anesthetized with 3 mL of 1% lidocaine superficially and 3 mL of 1% lidocaine with epinephrine deeper. A 18-gauge Bard spring-loaded biopsy device with introducer was used. This was advanced to the lesion with direct sonographic guidance. 5 tissue samples were obtained. A Green Elucent SmartClip applicator was advanced to the target using direct sonographic guidance. Once the applicator was in the appropriate position, the SmartClip was deployed under direct sonographic guidance. Manual pressure was held for 10 minutes with hemostasis obtained. Steri-Strips were  applied to the skin site. The tissue specimens were placed in formalin and sent to pathology. Postbiopsy digital mammograms were obtained. These demonstrate the Green SmartClip within the lymph node. There is no significant post-biopsy hematoma. The images were annotated and saved in PACS for reference in the operating room. A dry sterile dressing was applied. Post-biopsy instructions were reviewed with the patient. Written instructions were also provided. The patient was discharged from the department in good condition. IMPRESSION: Status post ultrasound-guided biopsy of a low-lying right axillary lymph node with subsequent placement of a Green SmartClip within the lymph node. An addendum will be made when the results are available. ACR BI-RADS Category:1000, pending pathology results. Report Verified by: Dorie Rank, MD at  07/02/2021 3:37 PM EDT    Addendum Date: 07/06/2021    ** ADDENDUM #1** ADDENDUM: Pathology results from the ultrasound-guided core needle biopsy of the low-lying right axillary lymph node demonstrates: - Fragments of lymph node tissue, negative for carcinoma (pancytokeratin immunostain confirmatory). Please see final pathology report for additional details.  Results are benign and concordant with imaging. BI-RADS 2: Benign findings. RECOMMENDATION: Anticipated excision of this lymph node. A Green SmartClip was placed in the lymph node at the time of biopsy given the patient's recurrent ipsilateral breast cancer. NOTIFICATION: The results and recommendation were communicated by Dr. Dorie Rank and acknowledged by the patient via telephone on 07/06/2021 4:34 PM EDT. END OF ADDENDUM Report Verified by: Dorie Rank, MD at 07/06/2021 4:38 PM EDT** ORIGINAL REPORT ** Procedure: Ultrasound-guided right breast biopsy, right breast ultrasound-guided SmartClip localization, and digital postbiopsy mammograms on 07/02/2021 12:50 PM EDT. Indication: 66 year old female with history of right breast cancer in 2015 status post bilateral mastectomies with reconstruction. 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 Patient presents today for ultrasound-guided biopsy of a low-lying right axillary lymph node. The procedure was performed by: Dorie Rank, MD (Breast Imaging Attending) Procedure Note: The risks and technique of the procedure including tissue marker placement were discussed with the patient. Written, informed consent was obtained. A time out was performed prior to the procedure. The side and site of the procedure was confirmed and marked. Ultrasound again demonstrated a mildly eccentrically thickened lymph node at 9:00, 12 cm from the nipple. The skin of the breast was cleansed and draped in sterile fashion. Sterile  technique was used. The skin and soft tissues were  anesthetized with 3 mL of 1% lidocaine superficially and 3 mL of 1% lidocaine with epinephrine deeper. A 18-gauge Bard spring-loaded biopsy device with introducer was used. This was advanced to the lesion with direct sonographic guidance. 5 tissue samples were obtained. A Green Elucent SmartClip applicator was advanced to the target using direct sonographic guidance. Once the applicator was in the appropriate position, the SmartClip was deployed under direct sonographic guidance. Manual pressure was held for 10 minutes with hemostasis obtained. Steri-Strips were  applied to the skin site. The tissue specimens were placed in formalin and sent to pathology. Postbiopsy digital mammograms were obtained. These demonstrate the Green SmartClip within the lymph node. There is no significant post-biopsy hematoma. The images were annotated and saved in PACS for reference in the operating room. A dry sterile dressing was applied. Post-biopsy instructions were reviewed with the patient. Written instructions were also provided. The patient was discharged from the department in good condition. IMPRESSION: Status post ultrasound-guided biopsy of a low-lying right axillary lymph node with subsequent placement of a Green SmartClip within the lymph node. An addendum will be made when the results are available. ACR BI-RADS Category:1000, pending pathology results. Report Verified by: Dorie Rank, MD at 07/02/2021 3:37 PM EDT    Result Date: 07/06/2021  Procedure: Ultrasound-guided right breast biopsy, right breast ultrasound-guided SmartClip localization, and digital postbiopsy mammograms on 07/02/2021 12:50 PM EDT. Indication: 66 year old female with history of right breast cancer in 2015 status post bilateral mastectomies with reconstruction. 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 Patient presents today for ultrasound-guided biopsy of a low-lying right axillary lymph node. The  procedure was performed by: Dorie Rank, MD (Breast Imaging Attending) Procedure Note: The risks and technique of the procedure including tissue marker placement were discussed with the patient. Written, informed consent was obtained. A time out was performed prior to the procedure. The side and site of the procedure was confirmed and marked. Ultrasound again demonstrated a mildly eccentrically thickened lymph node at 9:00, 12 cm from the nipple. The skin of the breast was cleansed and draped in sterile fashion. Sterile technique was used. The skin and soft tissues were anesthetized with 3 mL of 1% lidocaine superficially and 3 mL of 1% lidocaine with epinephrine deeper. A 18-gauge Bard spring-loaded biopsy device with introducer was used. This was advanced to the lesion with direct sonographic guidance. 5 tissue samples were obtained. A Green Elucent SmartClip applicator was advanced to the target using direct sonographic guidance. Once the applicator was in the appropriate position, the SmartClip was deployed under direct sonographic guidance. Manual pressure was held for 10 minutes with hemostasis obtained. Steri-Strips were  applied to the skin site. The tissue specimens were placed in formalin and sent to pathology. Postbiopsy digital mammograms were obtained. These demonstrate the Green SmartClip within the lymph node. There is no significant post-biopsy hematoma. The images were annotated and saved in PACS for reference in the operating room. A dry sterile dressing was applied. Post-biopsy instructions were reviewed with the patient. Written instructions were also provided. The patient was discharged from the department in good condition.     IMPRESSION: Status post ultrasound-guided biopsy of a low-lying right axillary lymph node with subsequent placement of a Green SmartClip within the lymph node. An addendum will be made when the results are available. ACR BI-RADS Category:1000, pending pathology  results. Report Verified by: Dorie Rank, MD at 07/02/2021 3:37  PM EDT    Mammography Comparison Images    Result Date: 06/30/2021  Images associated with this accession number were presented to [place] for comparison to an examination performed here.     Mammography Comparison Images    Result Date: 06/30/2021  Images associated with this accession number were presented to [place] for comparison to an examination performed here.     Mammography Comparison Images    Result Date: 06/30/2021  Images associated with this accession number were presented to [place] for comparison to an examination performed here.     Mammography Comparison Images    Result Date: 06/30/2021  Images associated with this accession number were presented to [place] for comparison to an examination performed here.     Mammography Comparison Images    Result Date: 06/27/2021  Images associated with this accession number were presented to [place] for comparison to an examination performed here.     Mammography Comparison Images    Result Date: 06/27/2021  Images associated with this accession number were presented to [place] for comparison to an examination performed here.     Mammography Comparison Images    Result Date: 06/27/2021  Images associated with this accession number were presented to [place] for comparison to an examination performed here.     Mammography Comparison Images    Result Date: 06/27/2021  Images associated with this accession number were presented to [place] for comparison to an examination performed here.     X-ray Comparison Images    Result Date: 06/27/2021  Images associated with this accession number were presented to [place] for comparison to an examination performed here.     X-ray Comparison Images    Result Date: 06/27/2021  Images associated with this accession number were presented to [place] for comparison to an examination performed here.     Mammo Smart Clip Placement W Imaging Guide Right    Addendum Date: 07/12/2021    ** ADDENDUM #1** ADDENDUM: Pathology results  from the ultrasound-guided core needle biopsy of the low-lying right axillary lymph node demonstrates: - Fragments of lymph node tissue, negative for carcinoma (pancytokeratin immunostain confirmatory). Please see final pathology report for additional details.  Results are benign and concordant with imaging. BI-RADS 2: Benign findings. RECOMMENDATION: Anticipated excision of this lymph node. A Green SmartClip was placed in the lymph node at the time of biopsy given the patient's recurrent ipsilateral breast cancer. NOTIFICATION: The results and recommendation were communicated by Dr. Dorie Rank and acknowledged by the patient via telephone on 07/06/2021 4:34 PM EDT. END OF ADDENDUM Report Verified by: Dorie Rank, MD at 07/06/2021 4:38 PM EDT** ORIGINAL REPORT ** Procedure: Ultrasound-guided right breast biopsy, right breast ultrasound-guided SmartClip localization, and digital postbiopsy mammograms on 07/02/2021 12:50 PM EDT. Indication: 66 year old female with history of right breast cancer in 2015 status post bilateral mastectomies with reconstruction. 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 Patient presents today for ultrasound-guided biopsy of a low-lying right axillary lymph node. The procedure was performed by: Dorie Rank, MD (Breast Imaging Attending) Procedure Note: The risks and technique of the procedure including tissue marker placement were discussed with the patient. Written, informed consent was obtained. A time out was performed prior to the procedure. The side and site of the procedure was confirmed and marked. Ultrasound again demonstrated a mildly eccentrically thickened lymph node at 9:00, 12 cm from the nipple. The skin of the breast was cleansed and draped in sterile fashion. Sterile technique was used.  The skin and soft tissues were anesthetized with 3 mL of 1% lidocaine superficially and 3 mL of 1% lidocaine with epinephrine deeper. A  18-gauge Bard spring-loaded biopsy device with introducer was used. This was advanced to the lesion with direct sonographic guidance. 5 tissue samples were obtained. A Green Elucent SmartClip applicator was advanced to the target using direct sonographic guidance. Once the applicator was in the appropriate position, the SmartClip was deployed under direct sonographic guidance. Manual pressure was held for 10 minutes with hemostasis obtained. Steri-Strips were  applied to the skin site. The tissue specimens were placed in formalin and sent to pathology. Postbiopsy digital mammograms were obtained. These demonstrate the Green SmartClip within the lymph node. There is no significant post-biopsy hematoma. The images were annotated and saved in PACS for reference in the operating room. A dry sterile dressing was applied. Post-biopsy instructions were reviewed with the patient. Written instructions were also provided. The patient was discharged from the department in good condition. IMPRESSION: Status post ultrasound-guided biopsy of a low-lying right axillary lymph node with subsequent placement of a Green SmartClip within the lymph node. An addendum will be made when the results are available. ACR BI-RADS Category:1000, pending pathology results. Report Verified by: Dorie Rank, MD at 07/02/2021 3:37 PM EDT    Addendum Date: 07/06/2021    ** ADDENDUM #1** ADDENDUM: Pathology results from the ultrasound-guided core needle biopsy of the low-lying right axillary lymph node demonstrates: - Fragments of lymph node tissue, negative for carcinoma (pancytokeratin immunostain confirmatory). Please see final pathology report for additional details.  Results are benign and concordant with imaging. BI-RADS 2: Benign findings. RECOMMENDATION: Anticipated excision of this lymph node. A Green SmartClip was placed in the lymph node at the time of biopsy given the patient's recurrent ipsilateral breast cancer. NOTIFICATION: The  results and recommendation were communicated by Dr. Dorie Rank and acknowledged by the patient via telephone on 07/06/2021 4:34 PM EDT. END OF ADDENDUM Report Verified by: Dorie Rank, MD at 07/06/2021 4:38 PM EDT** ORIGINAL REPORT ** Procedure: Ultrasound-guided right breast biopsy, right breast ultrasound-guided SmartClip localization, and digital postbiopsy mammograms on 07/02/2021 12:50 PM EDT. Indication: 66 year old female with history of right breast cancer in 2015 status post bilateral mastectomies with reconstruction. 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 Patient presents today for ultrasound-guided biopsy of a low-lying right axillary lymph node. The procedure was performed by: Dorie Rank, MD (Breast Imaging Attending) Procedure Note: The risks and technique of the procedure including tissue marker placement were discussed with the patient. Written, informed consent was obtained. A time out was performed prior to the procedure. The side and site of the procedure was confirmed and marked. Ultrasound again demonstrated a mildly eccentrically thickened lymph node at 9:00, 12 cm from the nipple. The skin of the breast was cleansed and draped in sterile fashion. Sterile technique was used. The skin and soft tissues were anesthetized with 3 mL of 1% lidocaine superficially and 3 mL of 1% lidocaine with epinephrine deeper. A 18-gauge Bard spring-loaded biopsy device with introducer was used. This was advanced to the lesion with direct sonographic guidance. 5 tissue samples were obtained. A Green Elucent SmartClip applicator was advanced to the target using direct sonographic guidance. Once the applicator was in the appropriate position, the SmartClip was deployed under direct sonographic guidance. Manual pressure was held for 10 minutes with hemostasis obtained. Steri-Strips were  applied to the skin site. The tissue specimens were placed in formalin  and sent to pathology. Postbiopsy digital mammograms were obtained. These demonstrate the Green SmartClip within the lymph node. There is no significant post-biopsy hematoma. The images were annotated and saved in PACS for reference in the operating room. A dry sterile dressing was applied. Post-biopsy instructions were reviewed with the patient. Written instructions were also provided. The patient was discharged from the department in good condition. IMPRESSION: Status post ultrasound-guided biopsy of a low-lying right axillary lymph node with subsequent placement of a Green SmartClip within the lymph node. An addendum will be made when the results are available. ACR BI-RADS Category:1000, pending pathology results. Report Verified by: Dorie Rank, MD at 07/02/2021 3:37 PM EDT    Result Date: 07/06/2021  Procedure: Ultrasound-guided right breast biopsy, right breast ultrasound-guided SmartClip localization, and digital postbiopsy mammograms on 07/02/2021 12:50 PM EDT. Indication: 66 year old female with history of right breast cancer in 2015 status post bilateral mastectomies with reconstruction. 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 Patient presents today for ultrasound-guided biopsy of a low-lying right axillary lymph node. The procedure was performed by: Dorie Rank, MD (Breast Imaging Attending) Procedure Note: The risks and technique of the procedure including tissue marker placement were discussed with the patient. Written, informed consent was obtained. A time out was performed prior to the procedure. The side and site of the procedure was confirmed and marked. Ultrasound again demonstrated a mildly eccentrically thickened lymph node at 9:00, 12 cm from the nipple. The skin of the breast was cleansed and draped in sterile fashion. Sterile technique was used. The skin and soft tissues were anesthetized with 3 mL of 1% lidocaine superficially and 3 mL of  1% lidocaine with epinephrine deeper. A 18-gauge Bard spring-loaded biopsy device with introducer was used. This was advanced to the lesion with direct sonographic guidance. 5 tissue samples were obtained. A Green Elucent SmartClip applicator was advanced to the target using direct sonographic guidance. Once the applicator was in the appropriate position, the SmartClip was deployed under direct sonographic guidance. Manual pressure was held for 10 minutes with hemostasis obtained. Steri-Strips were  applied to the skin site. The tissue specimens were placed in formalin and sent to pathology. Postbiopsy digital mammograms were obtained. These demonstrate the Green SmartClip within the lymph node. There is no significant post-biopsy hematoma. The images were annotated and saved in PACS for reference in the operating room. A dry sterile dressing was applied. Post-biopsy instructions were reviewed with the patient. Written instructions were also provided. The patient was discharged from the department in good condition.     Status post ultrasound-guided biopsy of a low-lying right axillary lymph node with subsequent placement of a Green SmartClip within the lymph node. An addendum will be made when the results are available. ACR BI-RADS Category:1000, pending pathology results. Report Verified by: Dorie Rank, MD at 07/02/2021 3:37 PM EDT     Mammo US Biopsy Lymph Node(s) Right    Addendum Date: 07/12/2021    ** ADDENDUM #1** ADDENDUM: Pathology results from the ultrasound-guided core needle biopsy of the low-lying right axillary lymph node demonstrates: - Fragments of lymph node tissue, negative for carcinoma (pancytokeratin immunostain confirmatory). Please see final pathology report for additional details.  Results are benign and concordant with imaging. BI-RADS 2: Benign findings. RECOMMENDATION: Anticipated excision of this lymph node. A Green SmartClip was placed in the lymph node at the time of biopsy given the  patient's recurrent ipsilateral breast cancer. NOTIFICATION: The results and recommendation were communicated  by Dr. Dorie Rank and acknowledged by the patient via telephone on 07/06/2021 4:34 PM EDT. END OF ADDENDUM Report Verified by: Dorie Rank, MD at 07/06/2021 4:38 PM EDT** ORIGINAL REPORT ** Procedure: Ultrasound-guided right breast biopsy, right breast ultrasound-guided SmartClip localization, and digital postbiopsy mammograms on 07/02/2021 12:50 PM EDT. Indication: 66 year old female with history of right breast cancer in 2015 status post bilateral mastectomies with reconstruction. 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 Patient presents today for ultrasound-guided biopsy of a low-lying right axillary lymph node. The procedure was performed by: Dorie Rank, MD (Breast Imaging Attending) Procedure Note: The risks and technique of the procedure including tissue marker placement were discussed with the patient. Written, informed consent was obtained. A time out was performed prior to the procedure. The side and site of the procedure was confirmed and marked. Ultrasound again demonstrated a mildly eccentrically thickened lymph node at 9:00, 12 cm from the nipple. The skin of the breast was cleansed and draped in sterile fashion. Sterile technique was used. The skin and soft tissues were anesthetized with 3 mL of 1% lidocaine superficially and 3 mL of 1% lidocaine with epinephrine deeper. A 18-gauge Bard spring-loaded biopsy device with introducer was used. This was advanced to the lesion with direct sonographic guidance. 5 tissue samples were obtained. A Green Elucent SmartClip applicator was advanced to the target using direct sonographic guidance. Once the applicator was in the appropriate position, the SmartClip was deployed under direct sonographic guidance. Manual pressure was held for 10 minutes with hemostasis obtained. Steri-Strips were  applied  to the skin site. The tissue specimens were placed in formalin and sent to pathology. Postbiopsy digital mammograms were obtained. These demonstrate the Green SmartClip within the lymph node. There is no significant post-biopsy hematoma. The images were annotated and saved in PACS for reference in the operating room. A dry sterile dressing was applied. Post-biopsy instructions were reviewed with the patient. Written instructions were also provided. The patient was discharged from the department in good condition. IMPRESSION: Status post ultrasound-guided biopsy of a low-lying right axillary lymph node with subsequent placement of a Green SmartClip within the lymph node. An addendum will be made when the results are available. ACR BI-RADS Category:1000, pending pathology results. Report Verified by: Dorie Rank, MD at 07/02/2021 3:37 PM EDT    Addendum Date: 07/06/2021    ** ADDENDUM #1** ADDENDUM: Pathology results from the ultrasound-guided core needle biopsy of the low-lying right axillary lymph node demonstrates: - Fragments of lymph node tissue, negative for carcinoma (pancytokeratin immunostain confirmatory). Please see final pathology report for additional details.  Results are benign and concordant with imaging. BI-RADS 2: Benign findings. RECOMMENDATION: Anticipated excision of this lymph node. A Green SmartClip was placed in the lymph node at the time of biopsy given the patient's recurrent ipsilateral breast cancer. NOTIFICATION: The results and recommendation were communicated by Dr. Dorie Rank and acknowledged by the patient via telephone on 07/06/2021 4:34 PM EDT. END OF ADDENDUM Report Verified by: Dorie Rank, MD at 07/06/2021 4:38 PM EDT** ORIGINAL REPORT ** Procedure: Ultrasound-guided right breast biopsy, right breast ultrasound-guided SmartClip localization, and digital postbiopsy mammograms on 07/02/2021 12:50 PM EDT. Indication: 66 year old female with history of right breast cancer in  2015 status post bilateral mastectomies with reconstruction. 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 Patient presents today for ultrasound-guided biopsy of a low-lying right axillary lymph node. The procedure was performed by: Dorie Rank, MD (Breast  Imaging Attending) Procedure Note: The risks and technique of the procedure including tissue marker placement were discussed with the patient. Written, informed consent was obtained. A time out was performed prior to the procedure. The side and site of the procedure was confirmed and marked. Ultrasound again demonstrated a mildly eccentrically thickened lymph node at 9:00, 12 cm from the nipple. The skin of the breast was cleansed and draped in sterile fashion. Sterile technique was used. The skin and soft tissues were anesthetized with 3 mL of 1% lidocaine superficially and 3 mL of 1% lidocaine with epinephrine deeper. A 18-gauge Bard spring-loaded biopsy device with introducer was used. This was advanced to the lesion with direct sonographic guidance. 5 tissue samples were obtained. A Green Elucent SmartClip applicator was advanced to the target using direct sonographic guidance. Once the applicator was in the appropriate position, the SmartClip was deployed under direct sonographic guidance. Manual pressure was held for 10 minutes with hemostasis obtained. Steri-Strips were  applied to the skin site. The tissue specimens were placed in formalin and sent to pathology. Postbiopsy digital mammograms were obtained. These demonstrate the Green SmartClip within the lymph node. There is no significant post-biopsy hematoma. The images were annotated and saved in PACS for reference in the operating room. A dry sterile dressing was applied. Post-biopsy instructions were reviewed with the patient. Written instructions were also provided. The patient was discharged from the department in good condition. IMPRESSION: Status post  ultrasound-guided biopsy of a low-lying right axillary lymph node with subsequent placement of a Green SmartClip within the lymph node. An addendum will be made when the results are available. ACR BI-RADS Category:1000, pending pathology results. Report Verified by: Dorie Rank, MD at 07/02/2021 3:37 PM EDT    Result Date: 07/06/2021  Procedure: Ultrasound-guided right breast biopsy, right breast ultrasound-guided SmartClip localization, and digital postbiopsy mammograms on 07/02/2021 12:50 PM EDT. Indication: 67 year old female with history of right breast cancer in 2015 status post bilateral mastectomies with reconstruction. 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 Patient presents today for ultrasound-guided biopsy of a low-lying right axillary lymph node. The procedure was performed by: Dorie Rank, MD (Breast Imaging Attending) Procedure Note: The risks and technique of the procedure including tissue marker placement were discussed with the patient. Written, informed consent was obtained. A time out was performed prior to the procedure. The side and site of the procedure was confirmed and marked. Ultrasound again demonstrated a mildly eccentrically thickened lymph node at 9:00, 12 cm from the nipple. The skin of the breast was cleansed and draped in sterile fashion. Sterile technique was used. The skin and soft tissues were anesthetized with 3 mL of 1% lidocaine superficially and 3 mL of 1% lidocaine with epinephrine deeper. A 18-gauge Bard spring-loaded biopsy device with introducer was used. This was advanced to the lesion with direct sonographic guidance. 5 tissue samples were obtained. A Green Elucent SmartClip applicator was advanced to the target using direct sonographic guidance. Once the applicator was in the appropriate position, the SmartClip was deployed under direct sonographic guidance. Manual pressure was held for 10 minutes with hemostasis  obtained. Steri-Strips were  applied to the skin site. The tissue specimens were placed in formalin and sent to pathology. Postbiopsy digital mammograms were obtained. These demonstrate the Green SmartClip within the lymph node. There is no significant post-biopsy hematoma. The images were annotated and saved in PACS for reference in the operating room. A dry sterile dressing was applied. Post-biopsy  instructions were reviewed with the patient. Written instructions were also provided. The patient was discharged from the department in good condition.     Status post ultrasound-guided biopsy of a low-lying right axillary lymph node with subsequent placement of a Green SmartClip within the lymph node. An addendum will be made when the results are available. ACR BI-RADS Category:1000, pending pathology results. Report Verified by: Dorie Rank, MD at 07/02/2021 3:37 PM EDT     Mammo post procedure images Right    Addendum Date: 07/12/2021    ** ADDENDUM #1** ADDENDUM: Pathology results from the ultrasound-guided core needle biopsy of the low-lying right axillary lymph node demonstrates: - Fragments of lymph node tissue, negative for carcinoma (pancytokeratin immunostain confirmatory). Please see final pathology report for additional details.  Results are benign and concordant with imaging. BI-RADS 2: Benign findings. RECOMMENDATION: Anticipated excision of this lymph node. A Green SmartClip was placed in the lymph node at the time of biopsy given the patient's recurrent ipsilateral breast cancer. NOTIFICATION: The results and recommendation were communicated by Dr. Dorie Rank and acknowledged by the patient via telephone on 07/06/2021 4:34 PM EDT. END OF ADDENDUM Report Verified by: Dorie Rank, MD at 07/06/2021 4:38 PM EDT** ORIGINAL REPORT ** Procedure: Ultrasound-guided right breast biopsy, right breast ultrasound-guided SmartClip localization, and digital postbiopsy mammograms on 07/02/2021 12:50 PM EDT.  Indication: 66 year old female with history of right breast cancer in 2015 status post bilateral mastectomies with reconstruction. 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 Patient presents today for ultrasound-guided biopsy of a low-lying right axillary lymph node. The procedure was performed by: Dorie Rank, MD (Breast Imaging Attending) Procedure Note: The risks and technique of the procedure including tissue marker placement were discussed with the patient. Written, informed consent was obtained. A time out was performed prior to the procedure. The side and site of the procedure was confirmed and marked. Ultrasound again demonstrated a mildly eccentrically thickened lymph node at 9:00, 12 cm from the nipple. The skin of the breast was cleansed and draped in sterile fashion. Sterile technique was used. The skin and soft tissues were anesthetized with 3 mL of 1% lidocaine superficially and 3 mL of 1% lidocaine with epinephrine deeper. A 18-gauge Bard spring-loaded biopsy device with introducer was used. This was advanced to the lesion with direct sonographic guidance. 5 tissue samples were obtained. A Green Elucent SmartClip applicator was advanced to the target using direct sonographic guidance. Once the applicator was in the appropriate position, the SmartClip was deployed under direct sonographic guidance. Manual pressure was held for 10 minutes with hemostasis obtained. Steri-Strips were  applied to the skin site. The tissue specimens were placed in formalin and sent to pathology. Postbiopsy digital mammograms were obtained. These demonstrate the Green SmartClip within the lymph node. There is no significant post-biopsy hematoma. The images were annotated and saved in PACS for reference in the operating room. A dry sterile dressing was applied. Post-biopsy instructions were reviewed with the patient. Written instructions were also provided. The patient was  discharged from the department in good condition. IMPRESSION: Status post ultrasound-guided biopsy of a low-lying right axillary lymph node with subsequent placement of a Green SmartClip within the lymph node. An addendum will be made when the results are available. ACR BI-RADS Category:1000, pending pathology results. Report Verified by: Dorie Rank, MD at 07/02/2021 3:37 PM EDT    Addendum Date: 07/06/2021    ** ADDENDUM #1** ADDENDUM: Pathology results from the ultrasound-guided core needle  biopsy of the low-lying right axillary lymph node demonstrates: - Fragments of lymph node tissue, negative for carcinoma (pancytokeratin immunostain confirmatory). Please see final pathology report for additional details.  Results are benign and concordant with imaging. BI-RADS 2: Benign findings. RECOMMENDATION: Anticipated excision of this lymph node. A Green SmartClip was placed in the lymph node at the time of biopsy given the patient's recurrent ipsilateral breast cancer. NOTIFICATION: The results and recommendation were communicated by Dr. Dorie Rank and acknowledged by the patient via telephone on 07/06/2021 4:34 PM EDT. END OF ADDENDUM Report Verified by: Dorie Rank, MD at 07/06/2021 4:38 PM EDT** ORIGINAL REPORT ** Procedure: Ultrasound-guided right breast biopsy, right breast ultrasound-guided SmartClip localization, and digital postbiopsy mammograms on 07/02/2021 12:50 PM EDT. Indication: 66 year old female with history of right breast cancer in 2015 status post bilateral mastectomies with reconstruction. 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 Patient presents today for ultrasound-guided biopsy of a low-lying right axillary lymph node. The procedure was performed by: Dorie Rank, MD (Breast Imaging Attending) Procedure Note: The risks and technique of the procedure including tissue marker placement were discussed with the patient. Written, informed  consent was obtained. A time out was performed prior to the procedure. The side and site of the procedure was confirmed and marked. Ultrasound again demonstrated a mildly eccentrically thickened lymph node at 9:00, 12 cm from the nipple. The skin of the breast was cleansed and draped in sterile fashion. Sterile technique was used. The skin and soft tissues were anesthetized with 3 mL of 1% lidocaine superficially and 3 mL of 1% lidocaine with epinephrine deeper. A 18-gauge Bard spring-loaded biopsy device with introducer was used. This was advanced to the lesion with direct sonographic guidance. 5 tissue samples were obtained. A Green Elucent SmartClip applicator was advanced to the target using direct sonographic guidance. Once the applicator was in the appropriate position, the SmartClip was deployed under direct sonographic guidance. Manual pressure was held for 10 minutes with hemostasis obtained. Steri-Strips were  applied to the skin site. The tissue specimens were placed in formalin and sent to pathology. Postbiopsy digital mammograms were obtained. These demonstrate the Green SmartClip within the lymph node. There is no significant post-biopsy hematoma. The images were annotated and saved in PACS for reference in the operating room. A dry sterile dressing was applied. Post-biopsy instructions were reviewed with the patient. Written instructions were also provided. The patient was discharged from the department in good condition. IMPRESSION: Status post ultrasound-guided biopsy of a low-lying right axillary lymph node with subsequent placement of a Green SmartClip within the lymph node. An addendum will be made when the results are available. ACR BI-RADS Category:1000, pending pathology results. Report Verified by: Dorie Rank, MD at 07/02/2021 3:37 PM EDT    Result Date: 07/06/2021  Procedure: Ultrasound-guided right breast biopsy, right breast ultrasound-guided SmartClip localization, and digital  postbiopsy mammograms on 07/02/2021 12:50 PM EDT. Indication: 66 year old female with history of right breast cancer in 2015 status post bilateral mastectomies with reconstruction. 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 Patient presents today for ultrasound-guided biopsy of a low-lying right axillary lymph node. The procedure was performed by: Dorie Rank, MD (Breast Imaging Attending) Procedure Note: The risks and technique of the procedure including tissue marker placement were discussed with the patient. Written, informed consent was obtained. A time out was performed prior to the procedure. The side and site of the procedure was confirmed and marked. Ultrasound  again demonstrated a mildly eccentrically thickened lymph node at 9:00, 12 cm from the nipple. The skin of the breast was cleansed and draped in sterile fashion. Sterile technique was used. The skin and soft tissues were anesthetized with 3 mL of 1% lidocaine superficially and 3 mL of 1% lidocaine with epinephrine deeper. A 18-gauge Bard spring-loaded biopsy device with introducer was used. This was advanced to the lesion with direct sonographic guidance. 5 tissue samples were obtained. A Green Elucent SmartClip applicator was advanced to the target using direct sonographic guidance. Once the applicator was in the appropriate position, the SmartClip was deployed under direct sonographic guidance. Manual pressure was held for 10 minutes with hemostasis obtained. Steri-Strips were  applied to the skin site. The tissue specimens were placed in formalin and sent to pathology. Postbiopsy digital mammograms were obtained. These demonstrate the Green SmartClip within the lymph node. There is no significant post-biopsy hematoma. The images were annotated and saved in PACS for reference in the operating room. A dry sterile dressing was applied. Post-biopsy instructions were reviewed with the patient. Written  instructions were also provided. The patient was discharged from the department in good condition.     IMPRESSION: Status post ultrasound-guided biopsy of a low-lying right axillary lymph node with subsequent placement of a Green SmartClip within the lymph node. An addendum will be made when the results are available. ACR BI-RADS Category:1000, pending pathology results. Report Verified by: Dorie Rank, MD at 07/02/2021 3:37 PM EDT    Mammo Smart Clip Placement W Imaging Guide Right    Addendum Date: 07/12/2021    ** ADDENDUM #1** ADDENDUM: Pathology results from the ultrasound-guided core needle biopsy of the low-lying right axillary lymph node demonstrates: - Fragments of lymph node tissue, negative for carcinoma (pancytokeratin immunostain confirmatory). Please see final pathology report for additional details.  Results are benign and concordant with imaging. BI-RADS 2: Benign findings. RECOMMENDATION: Anticipated excision of this lymph node. A Green SmartClip was placed in the lymph node at the time of biopsy given the patient's recurrent ipsilateral breast cancer. NOTIFICATION: The results and recommendation were communicated by Dr. Dorie Rank and acknowledged by the patient via telephone on 07/06/2021 4:34 PM EDT. END OF ADDENDUM Report Verified by: Dorie Rank, MD at 07/06/2021 4:38 PM EDT** ORIGINAL REPORT ** Procedure: Ultrasound-guided right breast biopsy, right breast ultrasound-guided SmartClip localization, and digital postbiopsy mammograms on 07/02/2021 12:50 PM EDT. Indication: 66 year old female with history of right breast cancer in 2015 status post bilateral mastectomies with reconstruction. 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 Patient presents today for ultrasound-guided biopsy of a low-lying right axillary lymph node. The procedure was performed by: Dorie Rank, MD (Breast Imaging Attending) Procedure Note: The risks and technique of  the procedure including tissue marker placement were discussed with the patient. Written, informed consent was obtained. A time out was performed prior to the procedure. The side and site of the procedure was confirmed and marked. Ultrasound again demonstrated a mildly eccentrically thickened lymph node at 9:00, 12 cm from the nipple. The skin of the breast was cleansed and draped in sterile fashion. Sterile technique was used. The skin and soft tissues were anesthetized with 3 mL of 1% lidocaine superficially and 3 mL of 1% lidocaine with epinephrine deeper. A 18-gauge Bard spring-loaded biopsy device with introducer was used. This was advanced to the lesion with direct sonographic guidance. 5 tissue samples were obtained. A Green Elucent SmartClip applicator was advanced to the  target using direct sonographic guidance. Once the applicator was in the appropriate position, the SmartClip was deployed under direct sonographic guidance. Manual pressure was held for 10 minutes with hemostasis obtained. Steri-Strips were  applied to the skin site. The tissue specimens were placed in formalin and sent to pathology. Postbiopsy digital mammograms were obtained. These demonstrate the Green SmartClip within the lymph node. There is no significant post-biopsy hematoma. The images were annotated and saved in PACS for reference in the operating room. A dry sterile dressing was applied. Post-biopsy instructions were reviewed with the patient. Written instructions were also provided. The patient was discharged from the department in good condition. IMPRESSION: Status post ultrasound-guided biopsy of a low-lying right axillary lymph node with subsequent placement of a Green SmartClip within the lymph node. An addendum will be made when the results are available. ACR BI-RADS Category:1000, pending pathology results. Report Verified by: Dorie Rank, MD at 07/02/2021 3:37 PM EDT    Addendum Date: 07/06/2021    ** ADDENDUM #1**  ADDENDUM: Pathology results from the ultrasound-guided core needle biopsy of the low-lying right axillary lymph node demonstrates: - Fragments of lymph node tissue, negative for carcinoma (pancytokeratin immunostain confirmatory). Please see final pathology report for additional details.  Results are benign and concordant with imaging. BI-RADS 2: Benign findings. RECOMMENDATION: Anticipated excision of this lymph node. A Green SmartClip was placed in the lymph node at the time of biopsy given the patient's recurrent ipsilateral breast cancer. NOTIFICATION: The results and recommendation were communicated by Dr. Dorie Rank and acknowledged by the patient via telephone on 07/06/2021 4:34 PM EDT. END OF ADDENDUM Report Verified by: Dorie Rank, MD at 07/06/2021 4:38 PM EDT** ORIGINAL REPORT ** Procedure: Ultrasound-guided right breast biopsy, right breast ultrasound-guided SmartClip localization, and digital postbiopsy mammograms on 07/02/2021 12:50 PM EDT. Indication: 65 year old female with history of right breast cancer in 2015 status post bilateral mastectomies with reconstruction. 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 Patient presents today for ultrasound-guided biopsy of a low-lying right axillary lymph node. The procedure was performed by: Dorie Rank, MD (Breast Imaging Attending) Procedure Note: The risks and technique of the procedure including tissue marker placement were discussed with the patient. Written, informed consent was obtained. A time out was performed prior to the procedure. The side and site of the procedure was confirmed and marked. Ultrasound again demonstrated a mildly eccentrically thickened lymph node at 9:00, 12 cm from the nipple. The skin of the breast was cleansed and draped in sterile fashion. Sterile technique was used. The skin and soft tissues were anesthetized with 3 mL of 1% lidocaine superficially and 3 mL of 1%  lidocaine with epinephrine deeper. A 18-gauge Bard spring-loaded biopsy device with introducer was used. This was advanced to the lesion with direct sonographic guidance. 5 tissue samples were obtained. A Green Elucent SmartClip applicator was advanced to the target using direct sonographic guidance. Once the applicator was in the appropriate position, the SmartClip was deployed under direct sonographic guidance. Manual pressure was held for 10 minutes with hemostasis obtained. Steri-Strips were  applied to the skin site. The tissue specimens were placed in formalin and sent to pathology. Postbiopsy digital mammograms were obtained. These demonstrate the Green SmartClip within the lymph node. There is no significant post-biopsy hematoma. The images were annotated and saved in PACS for reference in the operating room. A dry sterile dressing was applied. Post-biopsy instructions were reviewed with the patient. Written instructions were also provided. The  patient was discharged from the department in good condition. IMPRESSION: Status post ultrasound-guided biopsy of a low-lying right axillary lymph node with subsequent placement of a Green SmartClip within the lymph node. An addendum will be made when the results are available. ACR BI-RADS Category:1000, pending pathology results. Report Verified by: Dorie Rank, MD at 07/02/2021 3:37 PM EDT    Result Date: 07/06/2021  Procedure: Ultrasound-guided right breast biopsy, right breast ultrasound-guided SmartClip localization, and digital postbiopsy mammograms on 07/02/2021 12:50 PM EDT. Indication: 66 year old female with history of right breast cancer in 2015 status post bilateral mastectomies with reconstruction. 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 Patient presents today for ultrasound-guided biopsy of a low-lying right axillary lymph node. The procedure was performed by: Dorie Rank, MD (Breast Imaging Attending)  Procedure Note: The risks and technique of the procedure including tissue marker placement were discussed with the patient. Written, informed consent was obtained. A time out was performed prior to the procedure. The side and site of the procedure was confirmed and marked. Ultrasound again demonstrated a mildly eccentrically thickened lymph node at 9:00, 12 cm from the nipple. The skin of the breast was cleansed and draped in sterile fashion. Sterile technique was used. The skin and soft tissues were anesthetized with 3 mL of 1% lidocaine superficially and 3 mL of 1% lidocaine with epinephrine deeper. A 18-gauge Bard spring-loaded biopsy device with introducer was used. This was advanced to the lesion with direct sonographic guidance. 5 tissue samples were obtained. A Green Elucent SmartClip applicator was advanced to the target using direct sonographic guidance. Once the applicator was in the appropriate position, the SmartClip was deployed under direct sonographic guidance. Manual pressure was held for 10 minutes with hemostasis obtained. Steri-Strips were  applied to the skin site. The tissue specimens were placed in formalin and sent to pathology. Postbiopsy digital mammograms were obtained. These demonstrate the Green SmartClip within the lymph node. There is no significant post-biopsy hematoma. The images were annotated and saved in PACS for reference in the operating room. A dry sterile dressing was applied. Post-biopsy instructions were reviewed with the patient. Written instructions were also provided. The patient was discharged from the department in good condition.     Status post ultrasound-guided biopsy of a low-lying right axillary lymph node with subsequent placement of a Green SmartClip within the lymph node. An addendum will be made when the results are available. ACR BI-RADS Category:1000, pending pathology results. Report Verified by: Dorie Rank, MD at 07/02/2021 3:37 PM EDT       Past Medical  History:  Past Medical History:   Diagnosis Date    Cancer (CMS-HCC) 06/2013    (R) breast    Constipation 06/17/2013    IIBS    Depression     Dermatitis     Dyslipidemia     GERD (gastroesophageal reflux disease)     Hyperlipidemia     Kidney stones     Psoriasis     Sleep apnea 06/17/2013    not using C-PAP    Urinary tract infection        Past Surgical History:     Past Surgical History:   Procedure Laterality Date    BREAST SURGERY  06/19/13    immediate bilateral TE    COLONOSCOPY N/A 05/29/2012    Procedure: COLONOSCOPY W/ OR W/O BIOPSY;  Surgeon: Langley Adie, MD;  Location: [facility];  Service: Gastroenterology;  Laterality: N/A;  limited colonoscopy.  COLONOSCOPY N/A 06/27/2012    Procedure: COLONOSCOPY W/ OR W/O BIOPSY;  Surgeon: Langley Adieichard Rood, MD;  Location: [facility];  Service: Gastroenterology;  Laterality: N/A;  colonoscopy    DISSECTION AXILLARY Right 06/19/2013    Procedure: -POSS RIGHT AXILLARY NODE DISSECTION;  Surgeon: Cristal GenerousJaime D Lewis, MD;  Location: [facility] OR;  Service: General;  Laterality: Right;    ESOPHAGOGASTRODUODENOSCOPY N/A 05/29/2012    Procedure: EGD;  Surgeon: Langley Adieichard Rood, MD;  Location: [facility];  Service: Gastroenterology;  Laterality: N/A;    HYSTERECTOMY  0220    Hysty/BSO 2004-etopic pregnancy/prolapse    mid urethral sling   2002    at time of [facility]    REMOVE BREAST TISSUE EXPANDERS INSERTION IMPLANTS Bilateral 06/19/2013    Procedure: Dr. Jens SomKitzmiller at 1:30pm-placement of bilateral tissue expanders;  Surgeon: Yolanda MangesWilliam J Kitzmiller, MD;  Location: [facility] OR;  Service: Plastics;  Laterality: Bilateral;    REMOVE BREAST TISSUE EXPANDERS INSERTION IMPLANTS Bilateral 11/26/2013    Procedure: EXCHANGE BILATERAL TISSUE EXPANDER FOR IMPLANT;  Surgeon: Jonita Albeeiesa Monet Burnett, MD;  [facility];  Service: Plastics;  Laterality: Bilateral;    SALPINGOOPHORECTOMY  2004    Hysty/BSO-etopic pregnancy/prolapse    SINUS SURGERY      hx sinus surg x3    TONSILLECTOMY      in the past       Family  History of Cancer:  Family History   Problem Relation Age of Onset    Diabetes Mother     Hypertension Mother     Coronary artery disease Mother     Heart disease Mother         Triple Bypass    COPD Mother     Hyperlipidemia Mother     Coronary artery disease Father     Hyperlipidemia Father     COPD Father     Heart failure Father         CHF    Emphysema Father     Other Sister         Blood Disorder    COPD Sister     Psoriasis Sister     Heart attack Brother     COPD Brother     Heart disease Brother         1 brother -Triple Bypass    Other Brother         Heart Defect, Open heart surgery    Psoriasis Brother     Breast Cancer Other     COPD Other     Breast Cancer Other     Breast Cancer Paternal Aunt     Eczema Neg Hx     Melanoma Neg Hx          Social History:  Social History     Socioeconomic History    Marital status: Divorced     Spouse name: Not on file    Number of children: Not on file    Years of education: Not on file    Highest education level: Not on file   Occupational History    Not on file   Tobacco Use    Smoking status: Former     Types: Cigarettes     Quit date: 11/11/1989     Years since quitting: 31.7    Smokeless tobacco: Never    Tobacco comments:     quit 1991   Vaping Use    Vaping Use: Never used   Substance and Sexual Activity  Alcohol use: Not Currently     Comment: Rare     Drug use: No     Comment: 05-22-17    Sexual activity: Never     Birth control/protection: Diaphragm   Other Topics Concern    Caffeine Use Yes    Occupational Exposure No    Exercise No    Seat Belt Yes   Social History Narrative    Not on file     Social Determinants of Health     Financial Resource Strain: Not on file   Physical Activity: Not on file   Stress: Not on file   Social Connections: Not on file   Housing Stability: Not on file       Social History     Tobacco Use    Smoking status: Former     Types: Cigarettes     Quit date: 11/11/1989     Years since quitting: 31.7    Smokeless tobacco: Never     Tobacco comments:     quit 1991   Vaping Use    Vaping Use: Never used   Substance Use Topics    Alcohol use: Not Currently     Comment: Rare     Drug use: No     Comment: 05-22-17       Social History     Socioeconomic History    Marital status: Divorced     Spouse name: Not on file    Number of children: Not on file    Years of education: Not on file    Highest education level: Not on file   Occupational History    Not on file   Tobacco Use    Smoking status: Former     Types: Cigarettes     Quit date: 11/11/1989     Years since quitting: 31.7    Smokeless tobacco: Never    Tobacco comments:     quit 1991   Vaping Use    Vaping Use: Never used   Substance and Sexual Activity    Alcohol use: Not Currently     Comment: Rare     Drug use: No     Comment: 05-22-17    Sexual activity: Never     Birth control/protection: Diaphragm   Other Topics Concern    Caffeine Use Yes    Occupational Exposure No    Exercise No    Seat Belt Yes   Social History Narrative    Not on file     Social Determinants of Health     Financial Resource Strain: Not on file   Physical Activity: Not on file   Stress: Not on file   Social Connections: Not on file   Housing Stability: Not on file       Social History     Tobacco Use    Smoking status: Former     Types: Cigarettes     Quit date: 11/11/1989     Years since quitting: 31.7    Smokeless tobacco: Never    Tobacco comments:     quit 1991   Substance Use Topics    Alcohol use: Not Currently     Comment: Rare    .  Patient denies any tobacco, alcohol, or drug use.      Review of Systems:  The review of systems is unremarkable in the following categories:  HEENT, lungs, cardiovascular, GI, GU, musculoskeletal, neurologic, hematologic, endocrinology.  All remainder ROS is negative.  Allergies:  Allergies   Allergen Reactions    Statins-Hmg-Coa Reductase Inhibitors      Other reaction(s): Other reaction  severe leg cramps    Crestor [Rosuvastatin] Other (See Comments)     Causes tightening in  leg muscles.       Physical Examination:  Vitals:  There were no vitals filed for this visit.    General:  Patient appears well developed, well nourished, alert, oriented x 3 and in no acute respiratory distress.  At this time, she comes accompanied to the clinic visit by herself  HEENT:  Atraumatic, normocephalic.  Pupils equal, round and reactive to light and accommodation.  Sclerae anicteric.  Oropharynx is moist without any erythema or exudate.  No cervical lymphadenopathy.  Lungs:  Normal effort on RA  Heart:  Regular rate and rhythm without any murmurs, gallops or rubs.  Abdomen:  Positive bowel sounds.  Soft, nontender, no hepatosplenomegaly.  Extremities:  No edema, cyanosis or clubbing.  Breasts:  She has a 1 cm palpable mass in the right UOQ of her reconstructed breast. She also has a tan, 0.5cm mole at the inner quadrant of the same breast.  There is no additional palpable masses, skin changes, nipple inversion, or nipple discharge bilaterally.    Lymph node basin: There is no palpable adenopathy in the bilateral, cervical, supra/infraclavicular, and axillary nodal basin.          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 She has a ER pos, PR pos, HER2N neg, cTis, cN0, cM0, Stage 0 breast cancer. Patient is here today for a pre-operative visit.    Plan:   Treatment options were discussed in detail with the patient.  She has chosen to undergo a excision of local recurrence, attempt at repeat SLNbx, excision of clipped lymph node, possible completion axillary dissection. She also desires excision of small skin lesion on the right breast, which I will perform at time of recurrence excision. She meets with Dr. Abran Cantor to discuss angel wing excision, but discussed that this likely cannot be performed at the time of her excision given scheduling. Additionally, discussed that radiation is recommended after surgery given  recurrence, as well as systemic therapy which will be based on final pathology. She will need to do radiation and systemic therapy in [place]. She requests Tylenol #3 after surgery for pain control.   Patient signed surgical consents today with the most common complications including bleeding, infection, swelling/seroma, and wound dehiscence which were all reviewed with the patient.     She will have a phone visit with [facility].   She will return to the office for a postoperative visit approximately 2-3 weeks after surgery.  The patient does understand that there is a small risk that she may have to return to the operating room based on final pathology results.   Patient was advised to discontinue aspirin and other blood thinning products 5 days prior surgery and to not have anything to eat or drink after midnight the night prior to surgery.   Overall, she was in agreement with the recommendations.  All of her questions were answered to her satisfaction.  She was encouraged to call with additional questions or concerns.

## 2021-07-22 NOTE — Unmapped (Signed)
RN CHART NOTE      Genetics__completed with previous cancer and reported as negative_________    Plastics__NA__________    [facility] __phone _____    Hem/Onc_refer post op________    Rad/Onc___refer postop_________    Tentative surgery date ____7/24/23- lumpectomy, attempt to repeat SLNB, but will also excise smart clipped lymph node_______    Prehab (for any pt moving forward with mastectomy or possible ALND) ______NA______      Surgery instructions reviewed with patient. Educational papers reviewed with patient. All questions answered to her satisfaction. Pt will call with any additional questions or concerns.

## 2021-07-22 NOTE — Unmapped (Signed)
Review of Systems   All other systems reviewed and are negative.

## 2021-07-22 NOTE — Unmapped (Signed)
[facility]    Patient: Sarah Huang Age: 66 y.o.  MRN: [id number]       History of Present Illness:  Sarah Huang is a 66 y.o. woman who presents today for a consultation recurrent IDC ER/PR pos, HER2 negative.    Since last visit patient has undergone biopsy and smart clip placement of right axillary lymph node. Patient presents for surgical discussion and planning for right breast lumpectomy, lymph node biopsy and excision of clipped lymph node, possible axillary lymph node dissection on 08/02/21.       Reason for Assessment:  Pre-operative evaluation for a patient with recently diagnosed right breast local recurrence of IDC. She has completed work up and presents to finalize surgical plan.        Oncology History    No history exists.       Diagnostic Imaging:  Mammography Outside Film Review    Result Date: 07/01/2021  Outside film review performed on 07/01/2021 12:37 PM EDT. Indication: Patient underwent bilateral mastectomies in 2015 with reconstruction. She had breast imaging March 29, 2021, in [place], for abnormality seen on PET/CT. Additional breast imaging confirmed an area of probable recurrence with skin involvement, and this was found to be invasive ductal carcinoma after percutaneous biopsy. On the subsequent breast imaging, a mass in the upper outer reconstructed right breast was seen and also thought to be mildly suspicious, although a biopsy was not performed because of vascularity Imaging provided: *  PET/CT February 17, 2021 *  Breast MRI April 08, 2021 *  Ultrasound April 20, 2021 *  Ultrasound-guided core biopsy and postbiopsy mammogram May 19, 2021 Breast Density: The reconstructed breast tissue is predominantly fatty. Findings/Discussion: MRI *  There are intact silicone implants bilaterally *  MRI-skin enhancement and irregular enhancement extending deeper into the reconstructed right breast, axial location H 49.1. *  Skin enhancement extends approximately 1.5 cm  transversely and 1 cm sagittally, along the skin surface posterior extent is approximately 12 mm *  Enhancement extends approximately 2 to 3 mm from the shell of the implant *  Approximately 9 mm mass centered on axial position H 28.1 is likely a small lymph node and is borderline abnormal No axillary lymphadenopathy or other significant findings ULTRASOUND: *  Both lesions were found on ultrasound *  The mass involving the skin is seen at 1:00. *  There is skin thickening, with abnormal enhancement extending to a irregular hypoechoic mass with a surrounding thick echogenic rim of tissue. This extends to close to the implant shell. This measures approximately 12 mm *  The mass within the upper outer right breast is likely a lymph node and is labeled to be at 9:00 and measures approximately 7 to 8 mm in maximum dimension. It is multilobulated with borderline cortical thickness of 3 mm Ultrasound-guided core biopsy and post biopsy mammogram *  The mass involving the skin was biopsied. The tissue marker is adjacent to thickened skin. *  A small mass is visible within the upper outer reconstructed right breast, which likely is the mass seen on mammogram and MRI. *  The borderline abnormal lymph node was not biopsied     Ultrasound was done to reevaluate the probable lymph node in the upper outer right breast in preparation for biopsy and subsequent excision. This will be reported separately Recommendation: Surgical treatment ACR BI-RADS Category: 6 (Known Biopsy Proven Malignancy) Report Verified by: Lawrence Sobel, MD at 07/01/2021 1:18 PM EDT       Mammo US Biopsy Lymph Node(s) Right    Addendum Date: 07/12/2021    ** ADDENDUM #1** ADDENDUM: Pathology results from the ultrasound-guided core needle biopsy of the low-lying right axillary lymph node demonstrates: - Fragments of lymph node tissue, negative for carcinoma (pancytokeratin immunostain confirmatory). Please see final pathology report for additional details.   Results are benign and concordant with imaging. BI-RADS 2: Benign findings. RECOMMENDATION: Anticipated excision of this lymph node. A Green SmartClip was placed in the lymph node at the time of biopsy given the patient's recurrent ipsilateral breast cancer. NOTIFICATION: The results and recommendation were communicated by Dr. Brian Guarnieri and acknowledged by the patient via telephone on 07/06/2021 4:34 PM EDT. END OF ADDENDUM Report Verified by: Brian Guarnieri, MD at 07/06/2021 4:38 PM EDT** ORIGINAL REPORT ** Procedure: Ultrasound-guided right breast biopsy, right breast ultrasound-guided SmartClip localization, and digital postbiopsy mammograms on 07/02/2021 12:50 PM EDT. Indication: 66-year-old female with history of right breast cancer in 2015 status post bilateral mastectomies with reconstruction. 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 Patient presents today for ultrasound-guided biopsy of a low-lying right axillary lymph node. The procedure was performed by: Brian Guarnieri, MD (Breast Imaging Attending) Procedure Note: The risks and technique of the procedure including tissue marker placement were discussed with the patient. Written, informed consent was obtained. A time out was performed prior to the procedure. The side and site of the procedure was confirmed and marked. Ultrasound again demonstrated a mildly eccentrically thickened lymph node at 9:00, 12 cm from the nipple. The skin of the breast was cleansed and draped in sterile fashion. Sterile technique was used. The skin and soft tissues were anesthetized with 3 mL of 1% lidocaine superficially and 3 mL of 1% lidocaine with epinephrine deeper. A 18-gauge Bard spring-loaded biopsy device with introducer was used. This was advanced to the lesion with direct sonographic guidance. 5 tissue samples were obtained. A Green Elucent SmartClip applicator was advanced to the target using direct sonographic  guidance. Once the applicator was in the appropriate position, the SmartClip was deployed under direct sonographic guidance. Manual pressure was held for 10 minutes with hemostasis obtained. Steri-Strips were  applied to the skin site. The tissue specimens were placed in formalin and sent to pathology. Postbiopsy digital mammograms were obtained. These demonstrate the Green SmartClip within the lymph node. There is no significant post-biopsy hematoma. The images were annotated and saved in PACS for reference in the operating room. A dry sterile dressing was applied. Post-biopsy instructions were reviewed with the patient. Written instructions were also provided. The patient was discharged from the department in good condition. IMPRESSION: Status post ultrasound-guided biopsy of a low-lying right axillary lymph node with subsequent placement of a Green SmartClip within the lymph node. An addendum will be made when the results are available. ACR BI-RADS Category:1000, pending pathology results. Report Verified by: Brian Guarnieri, MD at 07/02/2021 3:37 PM EDT    Addendum Date: 07/06/2021    ** ADDENDUM #1** ADDENDUM: Pathology results from the ultrasound-guided core needle biopsy of the low-lying right axillary lymph node demonstrates: - Fragments of lymph node tissue, negative for carcinoma (pancytokeratin immunostain confirmatory). Please see final pathology report for additional details.  Results are benign and concordant with imaging. BI-RADS 2: Benign findings. RECOMMENDATION: Anticipated excision of this lymph node. A Green SmartClip was placed in the lymph node at the time of biopsy given the patient's recurrent ipsilateral breast cancer. NOTIFICATION: The results and recommendation were communicated by   Dr. Brian Guarnieri and acknowledged by the patient via telephone on 07/06/2021 4:34 PM EDT. END OF ADDENDUM Report Verified by: Brian Guarnieri, MD at 07/06/2021 4:38 PM EDT** ORIGINAL REPORT ** Procedure:  Ultrasound-guided right breast biopsy, right breast ultrasound-guided SmartClip localization, and digital postbiopsy mammograms on 07/02/2021 12:50 PM EDT. Indication: 66-year-old female with history of right breast cancer in 2015 status post bilateral mastectomies with reconstruction. 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 Patient presents today for ultrasound-guided biopsy of a low-lying right axillary lymph node. The procedure was performed by: Brian Guarnieri, MD (Breast Imaging Attending) Procedure Note: The risks and technique of the procedure including tissue marker placement were discussed with the patient. Written, informed consent was obtained. A time out was performed prior to the procedure. The side and site of the procedure was confirmed and marked. Ultrasound again demonstrated a mildly eccentrically thickened lymph node at 9:00, 12 cm from the nipple. The skin of the breast was cleansed and draped in sterile fashion. Sterile technique was used. The skin and soft tissues were anesthetized with 3 mL of 1% lidocaine superficially and 3 mL of 1% lidocaine with epinephrine deeper. A 18-gauge Bard spring-loaded biopsy device with introducer was used. This was advanced to the lesion with direct sonographic guidance. 5 tissue samples were obtained. A Green Elucent SmartClip applicator was advanced to the target using direct sonographic guidance. Once the applicator was in the appropriate position, the SmartClip was deployed under direct sonographic guidance. Manual pressure was held for 10 minutes with hemostasis obtained. Steri-Strips were  applied to the skin site. The tissue specimens were placed in formalin and sent to pathology. Postbiopsy digital mammograms were obtained. These demonstrate the Green SmartClip within the lymph node. There is no significant post-biopsy hematoma. The images were annotated and saved in PACS for reference in the operating room.  A dry sterile dressing was applied. Post-biopsy instructions were reviewed with the patient. Written instructions were also provided. The patient was discharged from the department in good condition. IMPRESSION: Status post ultrasound-guided biopsy of a low-lying right axillary lymph node with subsequent placement of a Green SmartClip within the lymph node. An addendum will be made when the results are available. ACR BI-RADS Category:1000, pending pathology results. Report Verified by: Brian Guarnieri, MD at 07/02/2021 3:37 PM EDT    Result Date: 07/06/2021  Procedure: Ultrasound-guided right breast biopsy, right breast ultrasound-guided SmartClip localization, and digital postbiopsy mammograms on 07/02/2021 12:50 PM EDT. Indication: 66-year-old female with history of right breast cancer in 2015 status post bilateral mastectomies with reconstruction. 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 Patient presents today for ultrasound-guided biopsy of a low-lying right axillary lymph node. The procedure was performed by: Brian Guarnieri, MD (Breast Imaging Attending) Procedure Note: The risks and technique of the procedure including tissue marker placement were discussed with the patient. Written, informed consent was obtained. A time out was performed prior to the procedure. The side and site of the procedure was confirmed and marked. Ultrasound again demonstrated a mildly eccentrically thickened lymph node at 9:00, 12 cm from the nipple. The skin of the breast was cleansed and draped in sterile fashion. Sterile technique was used. The skin and soft tissues were anesthetized with 3 mL of 1% lidocaine superficially and 3 mL of 1% lidocaine with epinephrine deeper. A 18-gauge Bard spring-loaded biopsy device with introducer was used. This was advanced to the lesion with direct sonographic guidance. 5 tissue samples   were obtained. A Green Elucent SmartClip applicator was advanced  to the target using direct sonographic guidance. Once the applicator was in the appropriate position, the SmartClip was deployed under direct sonographic guidance. Manual pressure was held for 10 minutes with hemostasis obtained. Steri-Strips were  applied to the skin site. The tissue specimens were placed in formalin and sent to pathology. Postbiopsy digital mammograms were obtained. These demonstrate the Green SmartClip within the lymph node. There is no significant post-biopsy hematoma. The images were annotated and saved in PACS for reference in the operating room. A dry sterile dressing was applied. Post-biopsy instructions were reviewed with the patient. Written instructions were also provided. The patient was discharged from the department in good condition.     Status post ultrasound-guided biopsy of a low-lying right axillary lymph node with subsequent placement of a Green SmartClip within the lymph node. An addendum will be made when the results are available. ACR BI-RADS Category:1000, pending pathology results. Report Verified by: Brian Guarnieri, MD at 07/02/2021 3:37 PM EDT     Mammo US Axilla Right    Addendum Date: 07/01/2021    ** ADDENDUM #1** On the current study, the ultrasound abnormality for which biopsy is recommended is labeled at 11:30, 5 cm from the nipple on the current study. In [place] it had been labeled 1:00 5 cm from the nipple, but this is the same lesion. Report Verified by: Lawrence Sobel, MD at 07/01/2021 12:37 PM EDT** ORIGINAL REPORT ** Exam: Diagnostic right axillary ultrasound performed 07/01/2021 9:21 AM EDT. Indication: Patient underwent bilateral mastectomies in 2015 with reconstruction. She had breast imaging March 29, 2021, in [place], for abnormality seen on PET/CT. Additional confirmed an area of probable recurrence with skin involvement, and this was found to be invasive ductal carcinoma. Also on the PET/CT and subsequent breast imaging, a mass in the upper outer  reconstructed right breast was seen and also thought to be suspicious, although a biopsy was not performed because of vascularity Comparison: Outside MRI, ultrasound, biopsy images, post biopsy mammogram, were reviewed. Findings: Because this is a reconstructed breast, an attempt was made to a sign clock position and distance from nipple, but this is limited Limited imaging of the site of recurrence was performed. This is labeled at 1:00, 5 cm from nipple (previously labeled at 11:30, 5 cm from nipple,. Both the biopsy marker and the tumor are well seen There is an echogenic central portion with surrounding echogenic rim, likely spiculations, which appear to extend to the skin surface. Ultrasound of the suspected lymph lymph node shows it to be at 9:00, 12 cm from the nipple. This is multilobulated and measures up to 1 cm. The hilum appears to be preserved. Imaging of the right axilla itself shows no definite lymphadenopathy. IMPRESSION: Biopsy-proven recurrence in the right breast with skin involvement, is redemonstrated and is seen to contain the tissue marker The abnormal low lying right axillary lymph node is also redemonstrated and because of its multiple lobulations, is thought to be borderline abnormal. Recommendation: Definitive treatment of the invasive ductal carcinoma, suspected to be a recurrence Ultrasound-guided core biopsy of the borderline abnormal node probably within the low right axilla. These findings and recommendations were discussed with the patient in person and given to the patient in written form.  The patient was taken to the scheduler to schedule an appointment for the recommended procedure prior to leaving our department. BI-RADS 4 (suspicious) Although the patient does have biopsy-proven carcinoma in the right breast, this   category refers to today's ultrasound of the lymph node I have personally reviewed the images and I agree with this report. Report Verified by: Lawrence Sobel, MD at  07/01/2021 10:33 AM EDT    Result Date: 07/01/2021  Exam: Diagnostic right axillary ultrasound performed 07/01/2021 9:21 AM EDT. Indication: Patient underwent bilateral mastectomies in 2015 with reconstruction. She had breast imaging March 29, 2021, in [place], for abnormality seen on PET/CT. Additional confirmed an area of probable recurrence with skin involvement, and this was found to be invasive ductal carcinoma. Also on the PET/CT and subsequent breast imaging, a mass in the upper outer reconstructed right breast was seen and also thought to be suspicious, although a biopsy was not performed because of vascularity Comparison: Outside MRI, ultrasound, biopsy images, post biopsy mammogram, were reviewed. Findings: Because this is a reconstructed breast, an attempt was made to a sign clock position and distance from nipple, but this is limited Limited imaging of the site of recurrence was performed. This is labeled at 1:00, 5 cm from nipple (previously labeled at 11:30, 5 cm from nipple,. Both the biopsy marker and the tumor are well seen There is an echogenic central portion with surrounding echogenic rim, likely spiculations, which appear to extend to the skin surface. Ultrasound of the suspected lymph lymph node shows it to be at 9:00, 12 cm from the nipple. This is multilobulated and measures up to 1 cm. The hilum appears to be preserved. Imaging of the right axilla itself shows no definite lymphadenopathy.     Biopsy-proven recurrence in the right breast with skin involvement, is redemonstrated and is seen to contain the tissue marker The abnormal low lying right axillary lymph node is also redemonstrated and because of its multiple lobulations, is thought to be borderline abnormal. Recommendation: Definitive treatment of the invasive ductal carcinoma, suspected to be a recurrence Ultrasound-guided core biopsy of the borderline abnormal node probably within the low right axilla. These findings and  recommendations were discussed with the patient in person and given to the patient in written form.  The patient was taken to the scheduler to schedule an appointment for the recommended procedure prior to leaving our department. BI-RADS 4 (suspicious) Although the patient does have biopsy-proven carcinoma in the right breast, this category refers to today's ultrasound of the lymph node I have personally reviewed the images and I agree with this report. Report Verified by: Lawrence Sobel, MD at 07/01/2021 10:33 AM EDT     Mammo post procedure images Right    Addendum Date: 07/12/2021    ** ADDENDUM #1** ADDENDUM: Pathology results from the ultrasound-guided core needle biopsy of the low-lying right axillary lymph node demonstrates: - Fragments of lymph node tissue, negative for carcinoma (pancytokeratin immunostain confirmatory). Please see final pathology report for additional details.  Results are benign and concordant with imaging. BI-RADS 2: Benign findings. RECOMMENDATION: Anticipated excision of this lymph node. A Green SmartClip was placed in the lymph node at the time of biopsy given the patient's recurrent ipsilateral breast cancer. NOTIFICATION: The results and recommendation were communicated by Dr. Brian Guarnieri and acknowledged by the patient via telephone on 07/06/2021 4:34 PM EDT. END OF ADDENDUM Report Verified by: Brian Guarnieri, MD at 07/06/2021 4:38 PM EDT** ORIGINAL REPORT ** Procedure: Ultrasound-guided right breast biopsy, right breast ultrasound-guided SmartClip localization, and digital postbiopsy mammograms on 07/02/2021 12:50 PM EDT. Indication: 66-year-old female with history of right breast cancer in 2015 status post bilateral mastectomies with reconstruction. Patient now with biopsy-proven recurrent   invasive ductal carcinoma of the right breast. Patient presents today for ultrasound-guided biopsy of a low-lying right axillary lymph node. The procedure was performed by: Brian Guarnieri, MD  (Breast Imaging Attending) Procedure Note: The risks and technique of the procedure including tissue marker placement were discussed with the patient. Written, informed consent was obtained. A time out was performed prior to the procedure. The side and site of the procedure was confirmed and marked. Ultrasound again demonstrated a mildly eccentrically thickened lymph node at 9:00, 12 cm from the nipple. The skin of the breast was cleansed and draped in sterile fashion. Sterile technique was used. The skin and soft tissues were anesthetized with 3 mL of 1% lidocaine superficially and 3 mL of 1% lidocaine with epinephrine deeper. A 18-gauge Bard spring-loaded biopsy device with introducer was used. This was advanced to the lesion with direct sonographic guidance. 5 tissue samples were obtained. A Green Elucent SmartClip applicator was advanced to the target using direct sonographic guidance. Once the applicator was in the appropriate position, the SmartClip was deployed under direct sonographic guidance. Manual pressure was held for 10 minutes with hemostasis obtained. Steri-Strips were  applied to the skin site. The tissue specimens were placed in formalin and sent to pathology. Postbiopsy digital mammograms were obtained. These demonstrate the Green SmartClip within the lymph node. There is no significant post-biopsy hematoma. The images were annotated and saved in PACS for reference in the operating room. A dry sterile dressing was applied. Post-biopsy instructions were reviewed with the patient. Written instructions were also provided. The patient was discharged from the department in good condition. IMPRESSION: Status post ultrasound-guided biopsy of a low-lying right axillary lymph node with subsequent placement of a Green SmartClip within the lymph node. An addendum will be made when the results are available. ACR BI-RADS Category:1000, pending pathology results. Report Verified by: Brian Guarnieri, MD at  07/02/2021 3:37 PM EDT    Addendum Date: 07/06/2021    ** ADDENDUM #1** ADDENDUM: Pathology results from the ultrasound-guided core needle biopsy of the low-lying right axillary lymph node demonstrates: - Fragments of lymph node tissue, negative for carcinoma (pancytokeratin immunostain confirmatory). Please see final pathology report for additional details.  Results are benign and concordant with imaging. BI-RADS 2: Benign findings. RECOMMENDATION: Anticipated excision of this lymph node. A Green SmartClip was placed in the lymph node at the time of biopsy given the patient's recurrent ipsilateral breast cancer. NOTIFICATION: The results and recommendation were communicated by Dr. Brian Guarnieri and acknowledged by the patient via telephone on 07/06/2021 4:34 PM EDT. END OF ADDENDUM Report Verified by: Brian Guarnieri, MD at 07/06/2021 4:38 PM EDT** ORIGINAL REPORT ** Procedure: Ultrasound-guided right breast biopsy, right breast ultrasound-guided SmartClip localization, and digital postbiopsy mammograms on 07/02/2021 12:50 PM EDT. Indication: 66-year-old female with history of right breast cancer in 2015 status post bilateral mastectomies with reconstruction. 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 Patient presents today for ultrasound-guided biopsy of a low-lying right axillary lymph node. The procedure was performed by: Brian Guarnieri, MD (Breast Imaging Attending) Procedure Note: The risks and technique of the procedure including tissue marker placement were discussed with the patient. Written, informed consent was obtained. A time out was performed prior to the procedure. The side and site of the procedure was confirmed and marked. Ultrasound again demonstrated a mildly eccentrically thickened lymph node at 9:00, 12 cm from the nipple. The skin of the breast was cleansed and draped in sterile fashion. Sterile   technique was used. The skin and soft tissues were  anesthetized with 3 mL of 1% lidocaine superficially and 3 mL of 1% lidocaine with epinephrine deeper. A 18-gauge Bard spring-loaded biopsy device with introducer was used. This was advanced to the lesion with direct sonographic guidance. 5 tissue samples were obtained. A Green Elucent SmartClip applicator was advanced to the target using direct sonographic guidance. Once the applicator was in the appropriate position, the SmartClip was deployed under direct sonographic guidance. Manual pressure was held for 10 minutes with hemostasis obtained. Steri-Strips were  applied to the skin site. The tissue specimens were placed in formalin and sent to pathology. Postbiopsy digital mammograms were obtained. These demonstrate the Green SmartClip within the lymph node. There is no significant post-biopsy hematoma. The images were annotated and saved in PACS for reference in the operating room. A dry sterile dressing was applied. Post-biopsy instructions were reviewed with the patient. Written instructions were also provided. The patient was discharged from the department in good condition. IMPRESSION: Status post ultrasound-guided biopsy of a low-lying right axillary lymph node with subsequent placement of a Green SmartClip within the lymph node. An addendum will be made when the results are available. ACR BI-RADS Category:1000, pending pathology results. Report Verified by: Brian Guarnieri, MD at 07/02/2021 3:37 PM EDT    Result Date: 07/06/2021  Procedure: Ultrasound-guided right breast biopsy, right breast ultrasound-guided SmartClip localization, and digital postbiopsy mammograms on 07/02/2021 12:50 PM EDT. Indication: 66-year-old female with history of right breast cancer in 2015 status post bilateral mastectomies with reconstruction. 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 Patient presents today for ultrasound-guided biopsy of a low-lying right axillary lymph node. The  procedure was performed by: Brian Guarnieri, MD (Breast Imaging Attending) Procedure Note: The risks and technique of the procedure including tissue marker placement were discussed with the patient. Written, informed consent was obtained. A time out was performed prior to the procedure. The side and site of the procedure was confirmed and marked. Ultrasound again demonstrated a mildly eccentrically thickened lymph node at 9:00, 12 cm from the nipple. The skin of the breast was cleansed and draped in sterile fashion. Sterile technique was used. The skin and soft tissues were anesthetized with 3 mL of 1% lidocaine superficially and 3 mL of 1% lidocaine with epinephrine deeper. A 18-gauge Bard spring-loaded biopsy device with introducer was used. This was advanced to the lesion with direct sonographic guidance. 5 tissue samples were obtained. A Green Elucent SmartClip applicator was advanced to the target using direct sonographic guidance. Once the applicator was in the appropriate position, the SmartClip was deployed under direct sonographic guidance. Manual pressure was held for 10 minutes with hemostasis obtained. Steri-Strips were  applied to the skin site. The tissue specimens were placed in formalin and sent to pathology. Postbiopsy digital mammograms were obtained. These demonstrate the Green SmartClip within the lymph node. There is no significant post-biopsy hematoma. The images were annotated and saved in PACS for reference in the operating room. A dry sterile dressing was applied. Post-biopsy instructions were reviewed with the patient. Written instructions were also provided. The patient was discharged from the department in good condition.     IMPRESSION: Status post ultrasound-guided biopsy of a low-lying right axillary lymph node with subsequent placement of a Green SmartClip within the lymph node. An addendum will be made when the results are available. ACR BI-RADS Category:1000, pending pathology  results. Report Verified by: Brian Guarnieri, MD at 07/02/2021 3:37   PM EDT    Mammography Comparison Images    Result Date: 06/30/2021  Images associated with this accession number were presented to us for comparison to an examination performed here.     Mammography Comparison Images    Result Date: 06/30/2021  Images associated with this accession number were presented to us for comparison to an examination performed here.     Mammography Comparison Images    Result Date: 06/30/2021  Images associated with this accession number were presented to us for comparison to an examination performed here.     Mammography Comparison Images    Result Date: 06/30/2021  Images associated with this accession number were presented to us for comparison to an examination performed here.     Mammography Comparison Images    Result Date: 06/27/2021  Images associated with this accession number were presented to us for comparison to an examination performed here.     Mammography Comparison Images    Result Date: 06/27/2021  Images associated with this accession number were presented to us for comparison to an examination performed here.     Mammography Comparison Images    Result Date: 06/27/2021  Images associated with this accession number were presented to us for comparison to an examination performed here.     Mammography Comparison Images    Result Date: 06/27/2021  Images associated with this accession number were presented to us for comparison to an examination performed here.     X-ray Comparison Images    Result Date: 06/27/2021  Images associated with this accession number were presented to us for comparison to an examination performed here.     X-ray Comparison Images    Result Date: 06/27/2021  Images associated with this accession number were presented to us for comparison to an examination performed here.     Mammo Smart Clip Placement W Imaging Guide Right    Addendum Date: 07/12/2021    ** ADDENDUM #1** ADDENDUM: Pathology results  from the ultrasound-guided core needle biopsy of the low-lying right axillary lymph node demonstrates: - Fragments of lymph node tissue, negative for carcinoma (pancytokeratin immunostain confirmatory). Please see final pathology report for additional details.  Results are benign and concordant with imaging. BI-RADS 2: Benign findings. RECOMMENDATION: Anticipated excision of this lymph node. A Green SmartClip was placed in the lymph node at the time of biopsy given the patient's recurrent ipsilateral breast cancer. NOTIFICATION: The results and recommendation were communicated by Dr. Brian Guarnieri and acknowledged by the patient via telephone on 07/06/2021 4:34 PM EDT. END OF ADDENDUM Report Verified by: Brian Guarnieri, MD at 07/06/2021 4:38 PM EDT** ORIGINAL REPORT ** Procedure: Ultrasound-guided right breast biopsy, right breast ultrasound-guided SmartClip localization, and digital postbiopsy mammograms on 07/02/2021 12:50 PM EDT. Indication: 66-year-old female with history of right breast cancer in 2015 status post bilateral mastectomies with reconstruction. 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 Patient presents today for ultrasound-guided biopsy of a low-lying right axillary lymph node. The procedure was performed by: Brian Guarnieri, MD (Breast Imaging Attending) Procedure Note: The risks and technique of the procedure including tissue marker placement were discussed with the patient. Written, informed consent was obtained. A time out was performed prior to the procedure. The side and site of the procedure was confirmed and marked. Ultrasound again demonstrated a mildly eccentrically thickened lymph node at 9:00, 12 cm from the nipple. The skin of the breast was cleansed and draped in sterile fashion. Sterile technique was used.   The skin and soft tissues were anesthetized with 3 mL of 1% lidocaine superficially and 3 mL of 1% lidocaine with epinephrine deeper. A  18-gauge Bard spring-loaded biopsy device with introducer was used. This was advanced to the lesion with direct sonographic guidance. 5 tissue samples were obtained. A Green Elucent SmartClip applicator was advanced to the target using direct sonographic guidance. Once the applicator was in the appropriate position, the SmartClip was deployed under direct sonographic guidance. Manual pressure was held for 10 minutes with hemostasis obtained. Steri-Strips were  applied to the skin site. The tissue specimens were placed in formalin and sent to pathology. Postbiopsy digital mammograms were obtained. These demonstrate the Green SmartClip within the lymph node. There is no significant post-biopsy hematoma. The images were annotated and saved in PACS for reference in the operating room. A dry sterile dressing was applied. Post-biopsy instructions were reviewed with the patient. Written instructions were also provided. The patient was discharged from the department in good condition. IMPRESSION: Status post ultrasound-guided biopsy of a low-lying right axillary lymph node with subsequent placement of a Green SmartClip within the lymph node. An addendum will be made when the results are available. ACR BI-RADS Category:1000, pending pathology results. Report Verified by: Brian Guarnieri, MD at 07/02/2021 3:37 PM EDT    Addendum Date: 07/06/2021    ** ADDENDUM #1** ADDENDUM: Pathology results from the ultrasound-guided core needle biopsy of the low-lying right axillary lymph node demonstrates: - Fragments of lymph node tissue, negative for carcinoma (pancytokeratin immunostain confirmatory). Please see final pathology report for additional details.  Results are benign and concordant with imaging. BI-RADS 2: Benign findings. RECOMMENDATION: Anticipated excision of this lymph node. A Green SmartClip was placed in the lymph node at the time of biopsy given the patient's recurrent ipsilateral breast cancer. NOTIFICATION: The  results and recommendation were communicated by Dr. Brian Guarnieri and acknowledged by the patient via telephone on 07/06/2021 4:34 PM EDT. END OF ADDENDUM Report Verified by: Brian Guarnieri, MD at 07/06/2021 4:38 PM EDT** ORIGINAL REPORT ** Procedure: Ultrasound-guided right breast biopsy, right breast ultrasound-guided SmartClip localization, and digital postbiopsy mammograms on 07/02/2021 12:50 PM EDT. Indication: 66-year-old female with history of right breast cancer in 2015 status post bilateral mastectomies with reconstruction. 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 Patient presents today for ultrasound-guided biopsy of a low-lying right axillary lymph node. The procedure was performed by: Brian Guarnieri, MD (Breast Imaging Attending) Procedure Note: The risks and technique of the procedure including tissue marker placement were discussed with the patient. Written, informed consent was obtained. A time out was performed prior to the procedure. The side and site of the procedure was confirmed and marked. Ultrasound again demonstrated a mildly eccentrically thickened lymph node at 9:00, 12 cm from the nipple. The skin of the breast was cleansed and draped in sterile fashion. Sterile technique was used. The skin and soft tissues were anesthetized with 3 mL of 1% lidocaine superficially and 3 mL of 1% lidocaine with epinephrine deeper. A 18-gauge Bard spring-loaded biopsy device with introducer was used. This was advanced to the lesion with direct sonographic guidance. 5 tissue samples were obtained. A Green Elucent SmartClip applicator was advanced to the target using direct sonographic guidance. Once the applicator was in the appropriate position, the SmartClip was deployed under direct sonographic guidance. Manual pressure was held for 10 minutes with hemostasis obtained. Steri-Strips were  applied to the skin site. The tissue specimens were placed in formalin    and sent to pathology. Postbiopsy digital mammograms were obtained. These demonstrate the Green SmartClip within the lymph node. There is no significant post-biopsy hematoma. The images were annotated and saved in PACS for reference in the operating room. A dry sterile dressing was applied. Post-biopsy instructions were reviewed with the patient. Written instructions were also provided. The patient was discharged from the department in good condition. IMPRESSION: Status post ultrasound-guided biopsy of a low-lying right axillary lymph node with subsequent placement of a Green SmartClip within the lymph node. An addendum will be made when the results are available. ACR BI-RADS Category:1000, pending pathology results. Report Verified by: Brian Guarnieri, MD at 07/02/2021 3:37 PM EDT    Result Date: 07/06/2021  Procedure: Ultrasound-guided right breast biopsy, right breast ultrasound-guided SmartClip localization, and digital postbiopsy mammograms on 07/02/2021 12:50 PM EDT. Indication: 66-year-old female with history of right breast cancer in 2015 status post bilateral mastectomies with reconstruction. 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 Patient presents today for ultrasound-guided biopsy of a low-lying right axillary lymph node. The procedure was performed by: Brian Guarnieri, MD (Breast Imaging Attending) Procedure Note: The risks and technique of the procedure including tissue marker placement were discussed with the patient. Written, informed consent was obtained. A time out was performed prior to the procedure. The side and site of the procedure was confirmed and marked. Ultrasound again demonstrated a mildly eccentrically thickened lymph node at 9:00, 12 cm from the nipple. The skin of the breast was cleansed and draped in sterile fashion. Sterile technique was used. The skin and soft tissues were anesthetized with 3 mL of 1% lidocaine superficially and 3 mL of  1% lidocaine with epinephrine deeper. A 18-gauge Bard spring-loaded biopsy device with introducer was used. This was advanced to the lesion with direct sonographic guidance. 5 tissue samples were obtained. A Green Elucent SmartClip applicator was advanced to the target using direct sonographic guidance. Once the applicator was in the appropriate position, the SmartClip was deployed under direct sonographic guidance. Manual pressure was held for 10 minutes with hemostasis obtained. Steri-Strips were  applied to the skin site. The tissue specimens were placed in formalin and sent to pathology. Postbiopsy digital mammograms were obtained. These demonstrate the Green SmartClip within the lymph node. There is no significant post-biopsy hematoma. The images were annotated and saved in PACS for reference in the operating room. A dry sterile dressing was applied. Post-biopsy instructions were reviewed with the patient. Written instructions were also provided. The patient was discharged from the department in good condition.     Status post ultrasound-guided biopsy of a low-lying right axillary lymph node with subsequent placement of a Green SmartClip within the lymph node. An addendum will be made when the results are available. ACR BI-RADS Category:1000, pending pathology results. Report Verified by: Brian Guarnieri, MD at 07/02/2021 3:37 PM EDT     Mammo US Biopsy Lymph Node(s) Right    Addendum Date: 07/12/2021    ** ADDENDUM #1** ADDENDUM: Pathology results from the ultrasound-guided core needle biopsy of the low-lying right axillary lymph node demonstrates: - Fragments of lymph node tissue, negative for carcinoma (pancytokeratin immunostain confirmatory). Please see final pathology report for additional details.  Results are benign and concordant with imaging. BI-RADS 2: Benign findings. RECOMMENDATION: Anticipated excision of this lymph node. A Green SmartClip was placed in the lymph node at the time of biopsy given the  patient's recurrent ipsilateral breast cancer. NOTIFICATION: The results and recommendation were communicated   by Dr. Brian Guarnieri and acknowledged by the patient via telephone on 07/06/2021 4:34 PM EDT. END OF ADDENDUM Report Verified by: Brian Guarnieri, MD at 07/06/2021 4:38 PM EDT** ORIGINAL REPORT ** Procedure: Ultrasound-guided right breast biopsy, right breast ultrasound-guided SmartClip localization, and digital postbiopsy mammograms on 07/02/2021 12:50 PM EDT. Indication: 66-year-old female with history of right breast cancer in 2015 status post bilateral mastectomies with reconstruction. 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 Patient presents today for ultrasound-guided biopsy of a low-lying right axillary lymph node. The procedure was performed by: Brian Guarnieri, MD (Breast Imaging Attending) Procedure Note: The risks and technique of the procedure including tissue marker placement were discussed with the patient. Written, informed consent was obtained. A time out was performed prior to the procedure. The side and site of the procedure was confirmed and marked. Ultrasound again demonstrated a mildly eccentrically thickened lymph node at 9:00, 12 cm from the nipple. The skin of the breast was cleansed and draped in sterile fashion. Sterile technique was used. The skin and soft tissues were anesthetized with 3 mL of 1% lidocaine superficially and 3 mL of 1% lidocaine with epinephrine deeper. A 18-gauge Bard spring-loaded biopsy device with introducer was used. This was advanced to the lesion with direct sonographic guidance. 5 tissue samples were obtained. A Green Elucent SmartClip applicator was advanced to the target using direct sonographic guidance. Once the applicator was in the appropriate position, the SmartClip was deployed under direct sonographic guidance. Manual pressure was held for 10 minutes with hemostasis obtained. Steri-Strips were  applied  to the skin site. The tissue specimens were placed in formalin and sent to pathology. Postbiopsy digital mammograms were obtained. These demonstrate the Green SmartClip within the lymph node. There is no significant post-biopsy hematoma. The images were annotated and saved in PACS for reference in the operating room. A dry sterile dressing was applied. Post-biopsy instructions were reviewed with the patient. Written instructions were also provided. The patient was discharged from the department in good condition. IMPRESSION: Status post ultrasound-guided biopsy of a low-lying right axillary lymph node with subsequent placement of a Green SmartClip within the lymph node. An addendum will be made when the results are available. ACR BI-RADS Category:1000, pending pathology results. Report Verified by: Brian Guarnieri, MD at 07/02/2021 3:37 PM EDT    Addendum Date: 07/06/2021    ** ADDENDUM #1** ADDENDUM: Pathology results from the ultrasound-guided core needle biopsy of the low-lying right axillary lymph node demonstrates: - Fragments of lymph node tissue, negative for carcinoma (pancytokeratin immunostain confirmatory). Please see final pathology report for additional details.  Results are benign and concordant with imaging. BI-RADS 2: Benign findings. RECOMMENDATION: Anticipated excision of this lymph node. A Green SmartClip was placed in the lymph node at the time of biopsy given the patient's recurrent ipsilateral breast cancer. NOTIFICATION: The results and recommendation were communicated by Dr. Brian Guarnieri and acknowledged by the patient via telephone on 07/06/2021 4:34 PM EDT. END OF ADDENDUM Report Verified by: Brian Guarnieri, MD at 07/06/2021 4:38 PM EDT** ORIGINAL REPORT ** Procedure: Ultrasound-guided right breast biopsy, right breast ultrasound-guided SmartClip localization, and digital postbiopsy mammograms on 07/02/2021 12:50 PM EDT. Indication: 66-year-old female with history of right breast cancer in  2015 status post bilateral mastectomies with reconstruction. 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 Patient presents today for ultrasound-guided biopsy of a low-lying right axillary lymph node. The procedure was performed by: Brian Guarnieri, MD (Breast   Imaging Attending) Procedure Note: The risks and technique of the procedure including tissue marker placement were discussed with the patient. Written, informed consent was obtained. A time out was performed prior to the procedure. The side and site of the procedure was confirmed and marked. Ultrasound again demonstrated a mildly eccentrically thickened lymph node at 9:00, 12 cm from the nipple. The skin of the breast was cleansed and draped in sterile fashion. Sterile technique was used. The skin and soft tissues were anesthetized with 3 mL of 1% lidocaine superficially and 3 mL of 1% lidocaine with epinephrine deeper. A 18-gauge Bard spring-loaded biopsy device with introducer was used. This was advanced to the lesion with direct sonographic guidance. 5 tissue samples were obtained. A Green Elucent SmartClip applicator was advanced to the target using direct sonographic guidance. Once the applicator was in the appropriate position, the SmartClip was deployed under direct sonographic guidance. Manual pressure was held for 10 minutes with hemostasis obtained. Steri-Strips were  applied to the skin site. The tissue specimens were placed in formalin and sent to pathology. Postbiopsy digital mammograms were obtained. These demonstrate the Green SmartClip within the lymph node. There is no significant post-biopsy hematoma. The images were annotated and saved in PACS for reference in the operating room. A dry sterile dressing was applied. Post-biopsy instructions were reviewed with the patient. Written instructions were also provided. The patient was discharged from the department in good condition. IMPRESSION: Status post  ultrasound-guided biopsy of a low-lying right axillary lymph node with subsequent placement of a Green SmartClip within the lymph node. An addendum will be made when the results are available. ACR BI-RADS Category:1000, pending pathology results. Report Verified by: Brian Guarnieri, MD at 07/02/2021 3:37 PM EDT    Result Date: 07/06/2021  Procedure: Ultrasound-guided right breast biopsy, right breast ultrasound-guided SmartClip localization, and digital postbiopsy mammograms on 07/02/2021 12:50 PM EDT. Indication: 66-year-old female with history of right breast cancer in 2015 status post bilateral mastectomies with reconstruction. 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 Patient presents today for ultrasound-guided biopsy of a low-lying right axillary lymph node. The procedure was performed by: Brian Guarnieri, MD (Breast Imaging Attending) Procedure Note: The risks and technique of the procedure including tissue marker placement were discussed with the patient. Written, informed consent was obtained. A time out was performed prior to the procedure. The side and site of the procedure was confirmed and marked. Ultrasound again demonstrated a mildly eccentrically thickened lymph node at 9:00, 12 cm from the nipple. The skin of the breast was cleansed and draped in sterile fashion. Sterile technique was used. The skin and soft tissues were anesthetized with 3 mL of 1% lidocaine superficially and 3 mL of 1% lidocaine with epinephrine deeper. A 18-gauge Bard spring-loaded biopsy device with introducer was used. This was advanced to the lesion with direct sonographic guidance. 5 tissue samples were obtained. A Green Elucent SmartClip applicator was advanced to the target using direct sonographic guidance. Once the applicator was in the appropriate position, the SmartClip was deployed under direct sonographic guidance. Manual pressure was held for 10 minutes with hemostasis  obtained. Steri-Strips were  applied to the skin site. The tissue specimens were placed in formalin and sent to pathology. Postbiopsy digital mammograms were obtained. These demonstrate the Green SmartClip within the lymph node. There is no significant post-biopsy hematoma. The images were annotated and saved in PACS for reference in the operating room. A dry sterile dressing was applied. Post-biopsy   instructions were reviewed with the patient. Written instructions were also provided. The patient was discharged from the department in good condition.     Status post ultrasound-guided biopsy of a low-lying right axillary lymph node with subsequent placement of a Green SmartClip within the lymph node. An addendum will be made when the results are available. ACR BI-RADS Category:1000, pending pathology results. Report Verified by: Brian Guarnieri, MD at 07/02/2021 3:37 PM EDT     Mammo post procedure images Right    Addendum Date: 07/12/2021    ** ADDENDUM #1** ADDENDUM: Pathology results from the ultrasound-guided core needle biopsy of the low-lying right axillary lymph node demonstrates: - Fragments of lymph node tissue, negative for carcinoma (pancytokeratin immunostain confirmatory). Please see final pathology report for additional details.  Results are benign and concordant with imaging. BI-RADS 2: Benign findings. RECOMMENDATION: Anticipated excision of this lymph node. A Green SmartClip was placed in the lymph node at the time of biopsy given the patient's recurrent ipsilateral breast cancer. NOTIFICATION: The results and recommendation were communicated by Dr. Brian Guarnieri and acknowledged by the patient via telephone on 07/06/2021 4:34 PM EDT. END OF ADDENDUM Report Verified by: Brian Guarnieri, MD at 07/06/2021 4:38 PM EDT** ORIGINAL REPORT ** Procedure: Ultrasound-guided right breast biopsy, right breast ultrasound-guided SmartClip localization, and digital postbiopsy mammograms on 07/02/2021 12:50 PM EDT.  Indication: 66-year-old female with history of right breast cancer in 2015 status post bilateral mastectomies with reconstruction. 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 Patient presents today for ultrasound-guided biopsy of a low-lying right axillary lymph node. The procedure was performed by: Brian Guarnieri, MD (Breast Imaging Attending) Procedure Note: The risks and technique of the procedure including tissue marker placement were discussed with the patient. Written, informed consent was obtained. A time out was performed prior to the procedure. The side and site of the procedure was confirmed and marked. Ultrasound again demonstrated a mildly eccentrically thickened lymph node at 9:00, 12 cm from the nipple. The skin of the breast was cleansed and draped in sterile fashion. Sterile technique was used. The skin and soft tissues were anesthetized with 3 mL of 1% lidocaine superficially and 3 mL of 1% lidocaine with epinephrine deeper. A 18-gauge Bard spring-loaded biopsy device with introducer was used. This was advanced to the lesion with direct sonographic guidance. 5 tissue samples were obtained. A Green Elucent SmartClip applicator was advanced to the target using direct sonographic guidance. Once the applicator was in the appropriate position, the SmartClip was deployed under direct sonographic guidance. Manual pressure was held for 10 minutes with hemostasis obtained. Steri-Strips were  applied to the skin site. The tissue specimens were placed in formalin and sent to pathology. Postbiopsy digital mammograms were obtained. These demonstrate the Green SmartClip within the lymph node. There is no significant post-biopsy hematoma. The images were annotated and saved in PACS for reference in the operating room. A dry sterile dressing was applied. Post-biopsy instructions were reviewed with the patient. Written instructions were also provided. The patient was  discharged from the department in good condition. IMPRESSION: Status post ultrasound-guided biopsy of a low-lying right axillary lymph node with subsequent placement of a Green SmartClip within the lymph node. An addendum will be made when the results are available. ACR BI-RADS Category:1000, pending pathology results. Report Verified by: Brian Guarnieri, MD at 07/02/2021 3:37 PM EDT    Addendum Date: 07/06/2021    ** ADDENDUM #1** ADDENDUM: Pathology results from the ultrasound-guided core needle   biopsy of the low-lying right axillary lymph node demonstrates: - Fragments of lymph node tissue, negative for carcinoma (pancytokeratin immunostain confirmatory). Please see final pathology report for additional details.  Results are benign and concordant with imaging. BI-RADS 2: Benign findings. RECOMMENDATION: Anticipated excision of this lymph node. A Green SmartClip was placed in the lymph node at the time of biopsy given the patient's recurrent ipsilateral breast cancer. NOTIFICATION: The results and recommendation were communicated by Dr. Brian Guarnieri and acknowledged by the patient via telephone on 07/06/2021 4:34 PM EDT. END OF ADDENDUM Report Verified by: Brian Guarnieri, MD at 07/06/2021 4:38 PM EDT** ORIGINAL REPORT ** Procedure: Ultrasound-guided right breast biopsy, right breast ultrasound-guided SmartClip localization, and digital postbiopsy mammograms on 07/02/2021 12:50 PM EDT. Indication: 66-year-old female with history of right breast cancer in 2015 status post bilateral mastectomies with reconstruction. 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 Patient presents today for ultrasound-guided biopsy of a low-lying right axillary lymph node. The procedure was performed by: Brian Guarnieri, MD (Breast Imaging Attending) Procedure Note: The risks and technique of the procedure including tissue marker placement were discussed with the patient. Written, informed  consent was obtained. A time out was performed prior to the procedure. The side and site of the procedure was confirmed and marked. Ultrasound again demonstrated a mildly eccentrically thickened lymph node at 9:00, 12 cm from the nipple. The skin of the breast was cleansed and draped in sterile fashion. Sterile technique was used. The skin and soft tissues were anesthetized with 3 mL of 1% lidocaine superficially and 3 mL of 1% lidocaine with epinephrine deeper. A 18-gauge Bard spring-loaded biopsy device with introducer was used. This was advanced to the lesion with direct sonographic guidance. 5 tissue samples were obtained. A Green Elucent SmartClip applicator was advanced to the target using direct sonographic guidance. Once the applicator was in the appropriate position, the SmartClip was deployed under direct sonographic guidance. Manual pressure was held for 10 minutes with hemostasis obtained. Steri-Strips were  applied to the skin site. The tissue specimens were placed in formalin and sent to pathology. Postbiopsy digital mammograms were obtained. These demonstrate the Green SmartClip within the lymph node. There is no significant post-biopsy hematoma. The images were annotated and saved in PACS for reference in the operating room. A dry sterile dressing was applied. Post-biopsy instructions were reviewed with the patient. Written instructions were also provided. The patient was discharged from the department in good condition. IMPRESSION: Status post ultrasound-guided biopsy of a low-lying right axillary lymph node with subsequent placement of a Green SmartClip within the lymph node. An addendum will be made when the results are available. ACR BI-RADS Category:1000, pending pathology results. Report Verified by: Brian Guarnieri, MD at 07/02/2021 3:37 PM EDT    Result Date: 07/06/2021  Procedure: Ultrasound-guided right breast biopsy, right breast ultrasound-guided SmartClip localization, and digital  postbiopsy mammograms on 07/02/2021 12:50 PM EDT. Indication: 66-year-old female with history of right breast cancer in 2015 status post bilateral mastectomies with reconstruction. 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 Patient presents today for ultrasound-guided biopsy of a low-lying right axillary lymph node. The procedure was performed by: Brian Guarnieri, MD (Breast Imaging Attending) Procedure Note: The risks and technique of the procedure including tissue marker placement were discussed with the patient. Written, informed consent was obtained. A time out was performed prior to the procedure. The side and site of the procedure was confirmed and marked. Ultrasound   again demonstrated a mildly eccentrically thickened lymph node at 9:00, 12 cm from the nipple. The skin of the breast was cleansed and draped in sterile fashion. Sterile technique was used. The skin and soft tissues were anesthetized with 3 mL of 1% lidocaine superficially and 3 mL of 1% lidocaine with epinephrine deeper. A 18-gauge Bard spring-loaded biopsy device with introducer was used. This was advanced to the lesion with direct sonographic guidance. 5 tissue samples were obtained. A Green Elucent SmartClip applicator was advanced to the target using direct sonographic guidance. Once the applicator was in the appropriate position, the SmartClip was deployed under direct sonographic guidance. Manual pressure was held for 10 minutes with hemostasis obtained. Steri-Strips were  applied to the skin site. The tissue specimens were placed in formalin and sent to pathology. Postbiopsy digital mammograms were obtained. These demonstrate the Green SmartClip within the lymph node. There is no significant post-biopsy hematoma. The images were annotated and saved in PACS for reference in the operating room. A dry sterile dressing was applied. Post-biopsy instructions were reviewed with the patient. Written  instructions were also provided. The patient was discharged from the department in good condition.     IMPRESSION: Status post ultrasound-guided biopsy of a low-lying right axillary lymph node with subsequent placement of a Green SmartClip within the lymph node. An addendum will be made when the results are available. ACR BI-RADS Category:1000, pending pathology results. Report Verified by: Brian Guarnieri, MD at 07/02/2021 3:37 PM EDT    Mammo Smart Clip Placement W Imaging Guide Right    Addendum Date: 07/12/2021    ** ADDENDUM #1** ADDENDUM: Pathology results from the ultrasound-guided core needle biopsy of the low-lying right axillary lymph node demonstrates: - Fragments of lymph node tissue, negative for carcinoma (pancytokeratin immunostain confirmatory). Please see final pathology report for additional details.  Results are benign and concordant with imaging. BI-RADS 2: Benign findings. RECOMMENDATION: Anticipated excision of this lymph node. A Green SmartClip was placed in the lymph node at the time of biopsy given the patient's recurrent ipsilateral breast cancer. NOTIFICATION: The results and recommendation were communicated by Dr. Brian Guarnieri and acknowledged by the patient via telephone on 07/06/2021 4:34 PM EDT. END OF ADDENDUM Report Verified by: Brian Guarnieri, MD at 07/06/2021 4:38 PM EDT** ORIGINAL REPORT ** Procedure: Ultrasound-guided right breast biopsy, right breast ultrasound-guided SmartClip localization, and digital postbiopsy mammograms on 07/02/2021 12:50 PM EDT. Indication: 66-year-old female with history of right breast cancer in 2015 status post bilateral mastectomies with reconstruction. 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 Patient presents today for ultrasound-guided biopsy of a low-lying right axillary lymph node. The procedure was performed by: Brian Guarnieri, MD (Breast Imaging Attending) Procedure Note: The risks and technique of  the procedure including tissue marker placement were discussed with the patient. Written, informed consent was obtained. A time out was performed prior to the procedure. The side and site of the procedure was confirmed and marked. Ultrasound again demonstrated a mildly eccentrically thickened lymph node at 9:00, 12 cm from the nipple. The skin of the breast was cleansed and draped in sterile fashion. Sterile technique was used. The skin and soft tissues were anesthetized with 3 mL of 1% lidocaine superficially and 3 mL of 1% lidocaine with epinephrine deeper. A 18-gauge Bard spring-loaded biopsy device with introducer was used. This was advanced to the lesion with direct sonographic guidance. 5 tissue samples were obtained. A Green Elucent SmartClip applicator was advanced to the   target using direct sonographic guidance. Once the applicator was in the appropriate position, the SmartClip was deployed under direct sonographic guidance. Manual pressure was held for 10 minutes with hemostasis obtained. Steri-Strips were  applied to the skin site. The tissue specimens were placed in formalin and sent to pathology. Postbiopsy digital mammograms were obtained. These demonstrate the Green SmartClip within the lymph node. There is no significant post-biopsy hematoma. The images were annotated and saved in PACS for reference in the operating room. A dry sterile dressing was applied. Post-biopsy instructions were reviewed with the patient. Written instructions were also provided. The patient was discharged from the department in good condition. IMPRESSION: Status post ultrasound-guided biopsy of a low-lying right axillary lymph node with subsequent placement of a Green SmartClip within the lymph node. An addendum will be made when the results are available. ACR BI-RADS Category:1000, pending pathology results. Report Verified by: Brian Guarnieri, MD at 07/02/2021 3:37 PM EDT    Addendum Date: 07/06/2021    ** ADDENDUM #1**  ADDENDUM: Pathology results from the ultrasound-guided core needle biopsy of the low-lying right axillary lymph node demonstrates: - Fragments of lymph node tissue, negative for carcinoma (pancytokeratin immunostain confirmatory). Please see final pathology report for additional details.  Results are benign and concordant with imaging. BI-RADS 2: Benign findings. RECOMMENDATION: Anticipated excision of this lymph node. A Green SmartClip was placed in the lymph node at the time of biopsy given the patient's recurrent ipsilateral breast cancer. NOTIFICATION: The results and recommendation were communicated by Dr. Brian Guarnieri and acknowledged by the patient via telephone on 07/06/2021 4:34 PM EDT. END OF ADDENDUM Report Verified by: Brian Guarnieri, MD at 07/06/2021 4:38 PM EDT** ORIGINAL REPORT ** Procedure: Ultrasound-guided right breast biopsy, right breast ultrasound-guided SmartClip localization, and digital postbiopsy mammograms on 07/02/2021 12:50 PM EDT. Indication: 66-year-old female with history of right breast cancer in 2015 status post bilateral mastectomies with reconstruction. 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 Patient presents today for ultrasound-guided biopsy of a low-lying right axillary lymph node. The procedure was performed by: Brian Guarnieri, MD (Breast Imaging Attending) Procedure Note: The risks and technique of the procedure including tissue marker placement were discussed with the patient. Written, informed consent was obtained. A time out was performed prior to the procedure. The side and site of the procedure was confirmed and marked. Ultrasound again demonstrated a mildly eccentrically thickened lymph node at 9:00, 12 cm from the nipple. The skin of the breast was cleansed and draped in sterile fashion. Sterile technique was used. The skin and soft tissues were anesthetized with 3 mL of 1% lidocaine superficially and 3 mL of 1%  lidocaine with epinephrine deeper. A 18-gauge Bard spring-loaded biopsy device with introducer was used. This was advanced to the lesion with direct sonographic guidance. 5 tissue samples were obtained. A Green Elucent SmartClip applicator was advanced to the target using direct sonographic guidance. Once the applicator was in the appropriate position, the SmartClip was deployed under direct sonographic guidance. Manual pressure was held for 10 minutes with hemostasis obtained. Steri-Strips were  applied to the skin site. The tissue specimens were placed in formalin and sent to pathology. Postbiopsy digital mammograms were obtained. These demonstrate the Green SmartClip within the lymph node. There is no significant post-biopsy hematoma. The images were annotated and saved in PACS for reference in the operating room. A dry sterile dressing was applied. Post-biopsy instructions were reviewed with the patient. Written instructions were also provided. The   patient was discharged from the department in good condition. IMPRESSION: Status post ultrasound-guided biopsy of a low-lying right axillary lymph node with subsequent placement of a Green SmartClip within the lymph node. An addendum will be made when the results are available. ACR BI-RADS Category:1000, pending pathology results. Report Verified by: Brian Guarnieri, MD at 07/02/2021 3:37 PM EDT    Result Date: 07/06/2021  Procedure: Ultrasound-guided right breast biopsy, right breast ultrasound-guided SmartClip localization, and digital postbiopsy mammograms on 07/02/2021 12:50 PM EDT. Indication: 66-year-old female with history of right breast cancer in 2015 status post bilateral mastectomies with reconstruction. 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 Patient presents today for ultrasound-guided biopsy of a low-lying right axillary lymph node. The procedure was performed by: Brian Guarnieri, MD (Breast Imaging Attending)  Procedure Note: The risks and technique of the procedure including tissue marker placement were discussed with the patient. Written, informed consent was obtained. A time out was performed prior to the procedure. The side and site of the procedure was confirmed and marked. Ultrasound again demonstrated a mildly eccentrically thickened lymph node at 9:00, 12 cm from the nipple. The skin of the breast was cleansed and draped in sterile fashion. Sterile technique was used. The skin and soft tissues were anesthetized with 3 mL of 1% lidocaine superficially and 3 mL of 1% lidocaine with epinephrine deeper. A 18-gauge Bard spring-loaded biopsy device with introducer was used. This was advanced to the lesion with direct sonographic guidance. 5 tissue samples were obtained. A Green Elucent SmartClip applicator was advanced to the target using direct sonographic guidance. Once the applicator was in the appropriate position, the SmartClip was deployed under direct sonographic guidance. Manual pressure was held for 10 minutes with hemostasis obtained. Steri-Strips were  applied to the skin site. The tissue specimens were placed in formalin and sent to pathology. Postbiopsy digital mammograms were obtained. These demonstrate the Green SmartClip within the lymph node. There is no significant post-biopsy hematoma. The images were annotated and saved in PACS for reference in the operating room. A dry sterile dressing was applied. Post-biopsy instructions were reviewed with the patient. Written instructions were also provided. The patient was discharged from the department in good condition.     Status post ultrasound-guided biopsy of a low-lying right axillary lymph node with subsequent placement of a Green SmartClip within the lymph node. An addendum will be made when the results are available. ACR BI-RADS Category:1000, pending pathology results. Report Verified by: Brian Guarnieri, MD at 07/02/2021 3:37 PM EDT       Past Medical  History:  Past Medical History:   Diagnosis Date    Cancer (CMS-HCC) 06/2013    (R) breast    Constipation 06/17/2013    IIBS    Depression     Dermatitis     Dyslipidemia     GERD (gastroesophageal reflux disease)     Hyperlipidemia     Kidney stones     Psoriasis     Sleep apnea 06/17/2013    not using C-PAP    Urinary tract infection        Past Surgical History:     Past Surgical History:   Procedure Laterality Date    BREAST SURGERY  06/19/13    immediate bilateral TE    COLONOSCOPY N/A 05/29/2012    Procedure: COLONOSCOPY W/ OR W/O BIOPSY;  Surgeon: Richard Rood, MD;  Location: [facility];  Service: Gastroenterology;  Laterality: N/A;  limited colonoscopy.      COLONOSCOPY N/A 06/27/2012    Procedure: COLONOSCOPY W/ OR W/O BIOPSY;  Surgeon: Richard Rood, MD;  Location: [facility];  Service: Gastroenterology;  Laterality: N/A;  colonoscopy    DISSECTION AXILLARY Right 06/19/2013    Procedure: -POSS RIGHT AXILLARY NODE DISSECTION;  Surgeon: Jaime D Lewis, MD;  Location: [facility] OR;  Service: General;  Laterality: Right;    ESOPHAGOGASTRODUODENOSCOPY N/A 05/29/2012    Procedure: EGD;  Surgeon: Richard Rood, MD;  Location: [facility];  Service: Gastroenterology;  Laterality: N/A;    HYSTERECTOMY  0220    Hysty/BSO 2004-etopic pregnancy/prolapse    mid urethral sling   2002    at time of [facility]    REMOVE BREAST TISSUE EXPANDERS INSERTION IMPLANTS Bilateral 06/19/2013    Procedure: Dr. Kitzmiller at 1:30pm-placement of bilateral tissue expanders;  Surgeon: William J Kitzmiller, MD;  Location: [facility];  Service: Plastics;  Laterality: Bilateral;    REMOVE BREAST TISSUE EXPANDERS INSERTION IMPLANTS Bilateral 11/26/2013    Procedure: EXCHANGE BILATERAL TISSUE EXPANDER FOR IMPLANT;  Surgeon: Riesa Monet Burnett, MD;  Location: [facility];  Service: Plastics;  Laterality: Bilateral;    SALPINGOOPHORECTOMY  2004    Hysty/BSO-etopic pregnancy/prolapse    SINUS SURGERY      hx sinus surg x3    TONSILLECTOMY      in the past       Family  History of Cancer:  Family History   Problem Relation Age of Onset    Diabetes Mother     Hypertension Mother     Coronary artery disease Mother     Heart disease Mother         Triple Bypass    COPD Mother     Hyperlipidemia Mother     Coronary artery disease Father     Hyperlipidemia Father     COPD Father     Heart failure Father         CHF    Emphysema Father     Other Sister         Blood Disorder    COPD Sister     Psoriasis Sister     Heart attack Brother     COPD Brother     Heart disease Brother         1 brother -Triple Bypass    Other Brother         Heart Defect, Open heart surgery    Psoriasis Brother     Breast Cancer Other     COPD Other     Breast Cancer Other     Breast Cancer Paternal Aunt     Eczema Neg Hx     Melanoma Neg Hx          Social History:  Social History     Socioeconomic History    Marital status: Divorced     Spouse name: Not on file    Number of children: Not on file    Years of education: Not on file    Highest education level: Not on file   Occupational History    Not on file   Tobacco Use    Smoking status: Former     Types: Cigarettes     Quit date: 11/11/1989     Years since quitting: 31.7    Smokeless tobacco: Never    Tobacco comments:     quit 1991   Vaping Use    Vaping Use: Never used   Substance and Sexual Activity      Alcohol use: Not Currently     Comment: Rare     Drug use: No     Comment: 05-22-17    Sexual activity: Never     Birth control/protection: Diaphragm   Other Topics Concern    Caffeine Use Yes    Occupational Exposure No    Exercise No    Seat Belt Yes   Social History Narrative    Not on file     Social Determinants of Health     Financial Resource Strain: Not on file   Physical Activity: Not on file   Stress: Not on file   Social Connections: Not on file   Housing Stability: Not on file       Social History     Tobacco Use    Smoking status: Former     Types: Cigarettes     Quit date: 11/11/1989     Years since quitting: 31.7    Smokeless tobacco: Never     Tobacco comments:     quit 1991   Vaping Use    Vaping Use: Never used   Substance Use Topics    Alcohol use: Not Currently     Comment: Rare     Drug use: No     Comment: 05-22-17       Social History     Socioeconomic History    Marital status: Divorced     Spouse name: Not on file    Number of children: Not on file    Years of education: Not on file    Highest education level: Not on file   Occupational History    Not on file   Tobacco Use    Smoking status: Former     Types: Cigarettes     Quit date: 11/11/1989     Years since quitting: 31.7    Smokeless tobacco: Never    Tobacco comments:     quit 1991   Vaping Use    Vaping Use: Never used   Substance and Sexual Activity    Alcohol use: Not Currently     Comment: Rare     Drug use: No     Comment: 05-22-17    Sexual activity: Never     Birth control/protection: Diaphragm   Other Topics Concern    Caffeine Use Yes    Occupational Exposure No    Exercise No    Seat Belt Yes   Social History Narrative    Not on file     Social Determinants of Health     Financial Resource Strain: Not on file   Physical Activity: Not on file   Stress: Not on file   Social Connections: Not on file   Housing Stability: Not on file       Social History     Tobacco Use    Smoking status: Former     Types: Cigarettes     Quit date: 11/11/1989     Years since quitting: 31.7    Smokeless tobacco: Never    Tobacco comments:     quit 1991   Substance Use Topics    Alcohol use: Not Currently     Comment: Rare    .  Patient denies any tobacco, alcohol, or drug use.      Review of Systems:  The review of systems is unremarkable in the following categories:  HEENT, lungs, cardiovascular, GI, GU, musculoskeletal, neurologic, hematologic, endocrinology.  All remainder ROS is negative.       Allergies:  Allergies   Allergen Reactions    Statins-Hmg-Coa Reductase Inhibitors      Other reaction(s): Other reaction  severe leg cramps    Crestor [Rosuvastatin] Other (See Comments)     Causes tightening in  leg muscles.       Physical Examination:  Vitals:  There were no vitals filed for this visit.    General:  Patient appears well developed, well nourished, alert, oriented x 3 and in no acute respiratory distress.  At this time, she comes accompanied to the clinic visit by herself  HEENT:  Atraumatic, normocephalic.  Pupils equal, round and reactive to light and accommodation.  Sclerae anicteric.  Oropharynx is moist without any erythema or exudate.  No cervical lymphadenopathy.  Lungs:  Normal effort on RA  Heart:  Regular rate and rhythm without any murmurs, gallops or rubs.  Abdomen:  Positive bowel sounds.  Soft, nontender, no hepatosplenomegaly.  Extremities:  No edema, cyanosis or clubbing.  Breasts:  She has a 1 cm palpable mass in the right UOQ of her reconstructed breast. She also has a tan, 0.5cm mole at the inner quadrant of the same breast.  There is no additional palpable masses, skin changes, nipple inversion, or nipple discharge bilaterally.    Lymph node basin: There is no palpable adenopathy in the bilateral, cervical, supra/infraclavicular, and axillary nodal basin.          Assessment:  Ms. 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 She has a ER pos, PR pos, HER2N neg, cTis, cN0, cM0, Stage 0 breast cancer. Patient is here today for a pre-operative visit.    Plan:   Treatment options were discussed in detail with the patient.  She has chosen to undergo a excision of local recurrence, attempt at repeat SLNbx, excision of clipped lymph node, possible completion axillary dissection. She also desires excision of small skin lesion on the right breast, which I will perform at time of recurrence excision. She meets with Dr. Gobble to discuss angel wing excision, but discussed that this likely cannot be performed at the time of her excision given scheduling. Additionally, discussed that radiation is recommended after surgery given  recurrence, as well as systemic therapy which will be based on final pathology. She will need to do radiation and systemic therapy in [place]. She requests Tylenol #3 after surgery for pain control.   Patient signed surgical consents today with the most common complications including bleeding, infection, swelling/seroma, and wound dehiscence which were all reviewed with the patient.     She will have a phone visit with [facility].   She will return to the office for a postoperative visit approximately 2-3 weeks after surgery.  The patient does understand that there is a small risk that she may have to return to the operating room based on final pathology results.   Patient was advised to discontinue aspirin and other blood thinning products 5 days prior surgery and to not have anything to eat or drink after midnight the night prior to surgery.   Overall, she was in agreement with the recommendations.  All of her questions were answered to her satisfaction.  She was encouraged to call with additional questions or concerns.

## 2021-07-23 ENCOUNTER — Ambulatory Visit: Admit: 2021-07-23 | Discharge: 2021-07-31 | Payer: PRIVATE HEALTH INSURANCE

## 2021-07-23 DIAGNOSIS — Z853 Personal history of malignant neoplasm of breast: Secondary | ICD-10-CM

## 2021-07-23 NOTE — Unmapped (Deleted)
No chief complaint on file.       History of Present Illness  Sarah Huang is a 66 y.o. female ***       Review of Systems    []  I have reviewed the ROS and agree as documented.    Allergies  Statins-hmg-coa reductase inhibitors and Crestor [rosuvastatin]    Medications  Outpatient Encounter Medications as of 07/23/2021   Medication Sig Dispense Refill    ascorbic acid, vitamin C, (VITAMIN C) 500 MG tablet 1 tablet (500 mg total) daily.      cetirizine (ZYRTEC) 10 MG tablet Take 1 tablet (10 mg total) by mouth daily.      cyanocobalamin, vitamin B-12, 1,000 mcg Subl Dissolve one tablet under tongue daily 100 tablet 3    ergocalciferol (ERGOCALCIFEROL) 1,250 mcg (50,000 unit) capsule TAKE 1 CAPSULE BY MOUTH 1 TIME A WEEK 12 capsule 3    ibuprofen (MOTRIN) 800 MG tablet Take 1 tablet (800 mg total) by mouth every 8 hours as needed. 270 tablet 0    omeprazole (PRILOSEC) 40 MG capsule TAKE 1 CAPSULE(40 MG) BY MOUTH EVERY MORNING BEFORE BREAKFAST 90 capsule 1    SUMAtriptan (IMITREX) 100 MG tablet Take one tablet po daily as needed for migraine, may repeat in 2 hours if needed in 24 hours. 12 tablet 2    TURMERIC ORAL Take by mouth.      [EXPIRED] lidocaine (PF) 2% (20 mg/mL) Soln 20 mg        No facility-administered encounter medications on file as of 07/23/2021.        Histories  She has a past medical history of Cancer (CMS-HCC) (06/2013), Constipation (06/17/2013), Depression, Dermatitis, Dyslipidemia, GERD (gastroesophageal reflux disease), Hyperlipidemia, Kidney stones, Psoriasis, Sleep apnea (06/17/2013), and Urinary tract infection.    She has a past surgical history that includes Tonsillectomy; Salpingoophorectomy (2004); Sinus surgery; mid urethral sling  (2002); Esophagogastroduodenoscopy (N/A, 05/29/2012); Colonoscopy (N/A, 05/29/2012); Colonoscopy (N/A, 06/27/2012); Hysterectomy (0220); remove breast tissue expanders insertion implants (Bilateral, 06/19/2013); dissection axillary (Right, 06/19/2013);  Breast surgery (06/19/13); and remove breast tissue expanders insertion implants (Bilateral, 11/26/2013).    Her family history includes Breast Cancer in her paternal aunt and other family members; COPD in her brother, father, mother, sister, and another family member; Coronary artery disease in her father and mother; Diabetes in her mother; Emphysema in her father; Heart attack in her brother; Heart disease in her brother and mother; Heart failure in her father; Hyperlipidemia in her father and mother; Hypertension in her mother; Other in her brother and sister; Psoriasis in her brother and sister.    She reports that she quit smoking about 31 years ago. Her smoking use included cigarettes. She has never used smokeless tobacco. She reports that she does not currently use alcohol. She reports that she does not use drugs.    There were no vitals taken for this visit.    Physical Exam         Assessment  ***    Plan  ***       Medical Decision Making  The following items were considered in medical decision making:  {ZOX:[id number]}

## 2021-07-23 NOTE — Unmapped (Signed)
Chief Complaint   Patient presents with    Excision of Axillary Fat/ possible Reconstruction     New Patient visit/ consult        History of Present Illness  Sarah Huang is a 66 y.o. female with a h/o right breast cancer s/p bilateral mastectomy with saline implant recon in 2015 presents for breast reconstruction evaluation. More recently she was diagnosed with recurrence of the IDC. She is being scheduled for excision of local recurrence, attempt at SLNBx and possible completion ALND with Dr. Ernst Breach. Currently has complaints related to the excess skin and fat in her axilla that make it difficult to fit into clothing and bras. She denies any pain related to implants, change in implant shape, swelling or rashes. She has no prior h/o xrt but is planned for xrt after excision of the recurrence. Nonsmoker. Lives in [place] and works as a Engineer, civil (consulting).       Review of Systems   Constitutional: Negative for chills, fatigue and fever.   Respiratory: Negative for cough, shortness of breath and wheezing.    Cardiovascular: Negative for chest pain, palpitations and leg swelling.   Gastrointestinal: Negative for constipation, diarrhea, nausea and vomiting.   Genitourinary: Negative for frequency and urgency.   Musculoskeletal: Positive for back pain. Negative for neck pain.   Neurological: Negative for tremors, weakness and headaches.   Hematological: Does not bruise/bleed easily.   Psychiatric/Behavioral: Negative for depression. The patient is nervous/anxious.        [x]  I have reviewed the ROS and agree as documented.    Allergies  Statins-hmg-coa reductase inhibitors and Crestor [rosuvastatin]    Medications  Outpatient Encounter Medications as of 07/23/2021   Medication Sig Dispense Refill    ascorbic acid, vitamin C, (VITAMIN C) 500 MG tablet 1 tablet (500 mg total) daily.      cetirizine (ZYRTEC) 10 MG tablet Take 1 tablet (10 mg total) by mouth daily.      cyanocobalamin, vitamin B-12, 1,000 mcg Subl Dissolve one tablet  under tongue daily 100 tablet 3    ergocalciferol (ERGOCALCIFEROL) 1,250 mcg (50,000 unit) capsule TAKE 1 CAPSULE BY MOUTH 1 TIME A WEEK 12 capsule 3    ibuprofen (MOTRIN) 800 MG tablet Take 1 tablet (800 mg total) by mouth every 8 hours as needed. 270 tablet 0    omeprazole (PRILOSEC) 40 MG capsule TAKE 1 CAPSULE(40 MG) BY MOUTH EVERY MORNING BEFORE BREAKFAST 90 capsule 1    SUMAtriptan (IMITREX) 100 MG tablet Take one tablet po daily as needed for migraine, may repeat in 2 hours if needed in 24 hours. 12 tablet 2    TURMERIC ORAL Take by mouth.      [EXPIRED] lidocaine (PF) 2% (20 mg/mL) Soln 20 mg        No facility-administered encounter medications on file as of 07/23/2021.        Histories  She has a past medical history of Cancer (CMS-HCC) (06/2013), Constipation (06/17/2013), Depression, Dermatitis, Dyslipidemia, GERD (gastroesophageal reflux disease), Hyperlipidemia, Kidney stones, Psoriasis, Sleep apnea (06/17/2013), and Urinary tract infection.    She has a past surgical history that includes Tonsillectomy; Salpingoophorectomy (2004); Sinus surgery; mid urethral sling  (2002); Esophagogastroduodenoscopy (N/A, 05/29/2012); Colonoscopy (N/A, 05/29/2012); Colonoscopy (N/A, 06/27/2012); Hysterectomy (0220); remove breast tissue expanders insertion implants (Bilateral, 06/19/2013); dissection axillary (Right, 06/19/2013); Breast surgery (06/19/13); and remove breast tissue expanders insertion implants (Bilateral, 11/26/2013).    Her family history includes Breast Cancer in her paternal aunt and other family  members; COPD in her brother, father, mother, sister, and another family member; Coronary artery disease in her father and mother; Diabetes in her mother; Emphysema in her father; Heart attack in her brother; Heart disease in her brother and mother; Heart failure in her father; Hyperlipidemia in her father and mother; Hypertension in her mother; Other in her brother and sister; Psoriasis in her brother and  sister.    She reports that she quit smoking about 31 years ago. Her smoking use included cigarettes. She has never used smokeless tobacco. She reports that she does not currently use alcohol. She reports that she does not use drugs.    Blood pressure 138/80, pulse 63, height 5' 3.5 (1.613 m), weight 199 lb (90.3 kg), SpO2 97 %.    Physical Exam  Exam conducted with a chaperone present.   Constitutional:       Appearance: She is obese.   Pulmonary:      Effort: Pulmonary effort is normal.   Chest:          Comments: Implants soft. Grade 2 capsule B. Excess axillary skin and fat B.  Skin:     General: Skin is warm and dry.   Neurological:      General: No focal deficit present.      Mental Status: She is alert.   Psychiatric:         Mood and Affect: Mood normal.            Assessment    Sarah Huang is a 66 y.o. female with a h/o right breast cancer s/p bilateral mastectomy with saline implant recon in 2015 presents for breast reconstruction evaluation.     Plan  Patient is bothered by excess skin and axillary fat so will plan for liposuction and dog ear excision. We discussed liposuction of the axilla. Risks include seroma, contour grooving, intraabdominal injury, fat necrosis, bleeding, infection, poor wound healing or scaring, residual loose skin, poor cosmetic result and the need for revisional surgery. We discussed the need to wear compression continuously for 3 moths postoperatively. Certainly its possible that after radiation intervention on the breasts might be necessary so I will have her follow-up 6 months after xrt for further evaluation. Risks, benefits and alternatives of surgery were discussed in detail. The surgical procedure was discussed in detail. All of the patients questions were answered, and the patient demonstrates a good understanding of the proposed surgical plan. Patient wishes to proceed with surgery.         Medical Decision Making  The following items were considered in medical  decision making:  Review / order radiology tests    Risks & Benefits:  Risks, benefits and treatment options discussed with patient.    Time Spent   45 minutes

## 2021-08-01 NOTE — Unmapped (Addendum)
[facility]  DEPARTMENT OF ANESTHESIOLOGY  PRE-PROCEDURAL EVALUATION    Sarah Huang is a 66 y.o. year old female presenting for:    Procedure(s):  RIGHT BREAST EXCISION OF RECURRENCE, EXCISION OF  RIGHT RIGHT BREAST SKIN LESION, SMARTCLIP LOCALIZED EXCISION OF CLIPPED AXILLARY LYMPH NODE  RIGHT AXILLARY SENTINEL LYMPH NODE BIOPSY,  POSSIBLE COMPLETION AXILLARY DISSECTION    Surgeon:   Neill Loft, MD    Chief Complaint     Malignant neoplasm of upper-inner quadrant of breast in female, estrogen r*    Review of Systems     Anesthesia Evaluation    Patient summary reviewed and Previous anesthesia note reviewed.       No history of anesthetic complications   I have reviewed the History and Physical Exam, any relevant changes are noted in the anesthesia pre-operative evaluation.      Cardiovascular:    Exercise tolerance: good    (+) hyperlipidemia.    (-) hypertension, CAD.  ROS comment:   Stress ECHO 2011  LV size, wall thickness and systolic function were normal.  LVEF 55-65%.  Normal wall motion, no regional wall motion abnormalities.     Stress EKG:  No stress arrhythmias or conduction abnormalities,  The stress EKG was negative for ischemia  Impression: normal study after maximal exercise.         Neuro/Muscoloskeletal/Psych:    (+) neuromuscular disease (hx of fibromyalgia), headaches (imitrex) and depression.    (-) seizures, CVA.     Pulmonary:    Sleep apnea.    (-) no pneumonia, COPD, shortness of breath, recent URI.       GI/Hepatic/Renal:    (+) liver disease (fatty liver disease).  GERD is well controlled.    (-) renal disease.    Endo/Other:    (+) cancer (breast cancer).      (-) diabetes mellitus.     Past Medical History     Past Medical History:   Diagnosis Date    Cancer (CMS-HCC) 06/2013    (R) breast    Constipation 06/17/2013    IIBS    Depression     Dermatitis     Dyslipidemia     GERD (gastroesophageal reflux disease)     Hyperlipidemia     Kidney stones     Psoriasis     Sleep  apnea 06/17/2013    not using C-PAP    Urinary tract infection        Past Surgical History     Past Surgical History:   Procedure Laterality Date    BREAST SURGERY  06/19/13    immediate bilateral TE    COLONOSCOPY N/A 05/29/2012    Procedure: COLONOSCOPY W/ OR W/O BIOPSY;  Surgeon: Langley Adie, MD;  Location: [facility];  Service: Gastroenterology;  Laterality: N/A;  limited colonoscopy.    COLONOSCOPY N/A 06/27/2012    Procedure: COLONOSCOPY W/ OR W/O BIOPSY;  Surgeon: Langley Adie, MD;  Location: [facility];  Service: Gastroenterology;  Laterality: N/A;  colonoscopy    DISSECTION AXILLARY Right 06/19/2013    Procedure: -POSS RIGHT AXILLARY NODE DISSECTION;  Surgeon: Cristal Generous, MD;  Location: [facility] OR;  Service: General;  Laterality: Right;    ESOPHAGOGASTRODUODENOSCOPY N/A 05/29/2012    Procedure: EGD;  Surgeon: Langley Adie, MD;  Location: [facility];  Service: Gastroenterology;  Laterality: N/A;    HYSTERECTOMY  0220    Hysty/BSO 2004-etopic pregnancy/prolapse    mid urethral sling   2002  at time of [facility]    REMOVE BREAST TISSUE EXPANDERS INSERTION IMPLANTS Bilateral 06/19/2013    Procedure: Dr. Jens Som at 1:30pm-placement of bilateral tissue expanders;  Surgeon: Yolanda Manges, MD;  Location: [facility] OR;  Service: Plastics;  Laterality: Bilateral;    REMOVE BREAST TISSUE EXPANDERS INSERTION IMPLANTS Bilateral 11/26/2013    Procedure: EXCHANGE BILATERAL TISSUE EXPANDER FOR IMPLANT;  Surgeon: Jonita Albee, MD;  [facility]: [facility];  Service: Plastics;  Laterality: Bilateral;    SALPINGOOPHORECTOMY  2004    Hysty/BSO-etopic pregnancy/prolapse    SINUS SURGERY      hx sinus surg x3    TONSILLECTOMY      in the past       Family History     Family History   Problem Relation Age of Onset    Diabetes Mother     Hypertension Mother     Coronary artery disease Mother     Heart disease Mother         Triple Bypass    COPD Mother     Hyperlipidemia Mother     Coronary artery disease  Father     Hyperlipidemia Father     COPD Father     Heart failure Father         CHF    Emphysema Father     Other Sister         Blood Disorder    COPD Sister     Psoriasis Sister     Heart attack Brother     COPD Brother     Heart disease Brother         1 brother -Triple Bypass    Other Brother         Heart Defect, Open heart surgery    Psoriasis Brother     Breast Cancer Other     COPD Other     Breast Cancer Other     Breast Cancer Paternal Aunt     Eczema Neg Hx     Melanoma Neg Hx        Social History     Social History     Socioeconomic History    Marital status: Divorced     Spouse name: Not on file    Number of children: Not on file    Years of education: Not on file    Highest education level: Not on file   Occupational History    Not on file   Tobacco Use    Smoking status: Former     Types: Cigarettes     Quit date: 11/11/1989     Years since quitting: 31.7    Smokeless tobacco: Never    Tobacco comments:     quit 1991   Vaping Use    Vaping Use: Never used   Substance and Sexual Activity    Alcohol use: Not Currently     Comment: Rare     Drug use: No     Comment: 05-22-17    Sexual activity: Never     Birth control/protection: Diaphragm   Other Topics Concern    Caffeine Use Yes    Occupational Exposure No    Exercise No    Seat Belt Yes   Social History Narrative    Not on file     Social Determinants of Health     Financial Resource Strain: Not on file   Physical Activity: Not on file   Stress: Not on file  Social Connections: Not on file   Housing Stability: Not on file       Medications     Allergies:  Allergies   Allergen Reactions    Statins-Hmg-Coa Reductase Inhibitors      Other reaction(s): Other reaction  severe leg cramps    Crestor [Rosuvastatin] Other (See Comments)     Causes tightening in leg muscles.       Home Meds:  Prior to Admission medications as of 07/27/21 0911   Medication Sig Taking?   ascorbic acid, vitamin C, (VITAMIN C) 500 MG tablet 1  tablet (500 mg total) daily. Yes   cetirizine (ZYRTEC) 10 MG tablet Take 1 tablet (10 mg total) by mouth daily. Yes   cyanocobalamin, vitamin B-12, 1,000 mcg Subl Dissolve one tablet under tongue daily Yes   ergocalciferol (ERGOCALCIFEROL) 1,250 mcg (50,000 unit) capsule TAKE 1 CAPSULE BY MOUTH 1 TIME A WEEK Yes   ibuprofen (MOTRIN) 800 MG tablet Take 1 tablet (800 mg total) by mouth every 8 hours as needed. Yes   omeprazole (PRILOSEC) 40 MG capsule TAKE 1 CAPSULE(40 MG) BY MOUTH EVERY MORNING BEFORE BREAKFAST Yes   TURMERIC ORAL Take by mouth. Yes   SUMAtriptan (IMITREX) 100 MG tablet Take one tablet po daily as needed for migraine, may repeat in 2 hours if needed in 24 hours.        Inpatient Meds:  Scheduled:   Continuous:     PRN:     Vital Signs     Wt Readings from Last 3 Encounters:   07/23/21 199 lb (90.3 kg)   07/22/21 200 lb (90.7 kg)   07/01/21 196 lb (88.9 kg)     Ht Readings from Last 3 Encounters:   07/23/21 5' 3.5 (1.613 m)   07/22/21 5' 3.5 (1.613 m)   07/01/21 5' 3.5 (1.613 m)     Temp Readings from Last 3 Encounters:   07/22/21 97.5 F (36.4 C) (Temporal)   07/01/21 97.4 F (36.3 C)   12/15/16 98 F (36.7 C) (Oral)     BP Readings from Last 3 Encounters:   07/23/21 138/80   07/22/21 153/76   07/01/21 134/57     Pulse Readings from Last 3 Encounters:   07/23/21 63   07/22/21 66   07/01/21 77     @LASTSAO2 (3)@    Physical Exam     Airway:     Mallampati: II  Mouth Opening: >2 FB  TM distance: > = 3 FB  Neck ROM: full    Dental:   - No obvious cracked, loose, chipped, or missing teeth.   (+) implants    Pulmonary:   - normal exam      Breath sounds clear to auscultation.       Cardiovascular:  - normal exam   Rhythm: regular  Rate: normal    Neuro/Musculoskeletal/Psych:  - normal neurological exam.   Mental status: alert and oriented to person, place and time.          Abdominal:    - normal exam    Current OB Status:       Other Findings:        Laboratory Data     Lab Results   Component Value  Date    WBC 6.9 10/01/2018    HGB 12.8 10/01/2018    HCT 38.0 10/01/2018    MCV 90.0 10/01/2018    PLT 269 10/01/2018       No results found for:  ABORH    Lab Results   Component Value Date    GLUCOSE 93 10/01/2018    BUN 16 10/01/2018    CO2 29 10/01/2018    CREATININE 0.65 10/01/2018    K 4.3 10/01/2018    NA 140 10/01/2018    CL 104 10/01/2018    CALCIUM 9.6 10/01/2018    ALBUMIN 4.2 10/01/2018    PROT 6.9 10/01/2018    ALKPHOS 69 10/01/2018    ALT 17 10/01/2018    AST 18 04/13/2011    BILITOT 0.3 10/01/2018       No results found for: PTT, INR    No results found for: PREGTESTUR, PREGSERUM, HCG, HCGQUANT    Anesthesia Plan     ASA 3         Female and current non-smoker    Anesthesia Type:  general endotracheal.      PONV Risk Factors: female, current non-smoker              Induction:   Intravenous induction.    (Anesthetic Plan:   Type/Airway: GETA  Access: PIV access x1-2  Analgesia: multimodal analgesia including PO & IV adjuncts including opiates if warranted  Monitors: standard ASA monitors  Dispo: PACU recovery   Other: aggressive antiemetics)  Anesthetic plan and risks discussed with patient.    Plan, alternatives, and risks of anesthesia, including death, have been explained to and discussed with the patient/legal guardian.  By my assessment, the patient/legal guardian understands and agrees.  Scenario presented in detail.  Questions answered.    Use of blood products discussed with patient who consented to blood products.       Plan discussed with attending and CRNA.

## 2021-08-02 ENCOUNTER — Inpatient Hospital Stay: Admit: 2021-08-02 | Payer: PRIVATE HEALTH INSURANCE

## 2021-08-02 ENCOUNTER — Inpatient Hospital Stay: Admit: 2021-07-30 | Payer: PRIVATE HEALTH INSURANCE

## 2021-08-02 DIAGNOSIS — C50219 Malignant neoplasm of upper-inner quadrant of unspecified female breast: Secondary | ICD-10-CM

## 2021-08-02 LAB — ABO/RH: Rh Type: POSITIVE

## 2021-08-02 LAB — ANTIBODY SCREEN: Antibody Screen: NEGATIVE

## 2021-08-02 MED ORDER — proMETHazine (PHENERGAN) injection 6.25 mg
25 | Freq: Four times a day (QID) | INTRAMUSCULAR | PRN
Start: 2021-08-02 — End: 2021-08-02

## 2021-08-02 MED ORDER — fentaNYL (SUBLIMAZE) injection 12.5 mcg
50 | INTRAMUSCULAR | PRN
Start: 2021-08-02 — End: 2021-08-02

## 2021-08-02 MED ORDER — fentaNYL (SUBLIMAZE) injection
50 | INTRAMUSCULAR | PRN
Start: 2021-08-02 — End: 2021-08-02
  Administered 2021-08-02: 19:00:00 50 via INTRAVENOUS

## 2021-08-02 MED ORDER — phenylephrine (NEO-SYNEPHRINE) injection
10 | INTRAMUSCULAR | PRN
Start: 2021-08-02 — End: 2021-08-02
  Administered 2021-08-02 (×2): 100 via INTRAVENOUS

## 2021-08-02 MED ORDER — fentaNYL (SUBLIMAZE) 50 mcg/mL injection
50 | INTRAMUSCULAR | Status: AC
Start: 2021-08-02 — End: ?

## 2021-08-02 MED ORDER — propofol 10 mg/ml (DIPRIVAN) 10 mg/mL injection
10 | INTRAVENOUS | Status: AC
Start: 2021-08-02 — End: ?

## 2021-08-02 MED ORDER — dextrose 10%-water (D10W) IV soln
INTRAVENOUS | PRN
Start: 2021-08-02 — End: 2021-08-02

## 2021-08-02 MED ORDER — dexamethasone (DECADRON) injection
4 | INTRAMUSCULAR | PRN
Start: 2021-08-02 — End: 2021-08-02
  Administered 2021-08-02 (×2): 4 via INTRAVENOUS

## 2021-08-02 MED ORDER — acetaminophen (TYLENOL) tablet 975 mg
325 | ORAL | Status: AC | PRN
Start: 2021-08-02 — End: 2021-08-02
  Administered 2021-08-02: 16:00:00 975 mg via ORAL

## 2021-08-02 MED ORDER — lactated Ringers infusion
INTRAVENOUS
Start: 2021-08-02 — End: 2021-08-02

## 2021-08-02 MED ORDER — celecoxib (CELEBREX) capsule 200 mg
200 | ORAL | PRN
Start: 2021-08-02 — End: 2021-08-02

## 2021-08-02 MED ORDER — midazolam (PF) (VERSED) 1 mg/mL injection
1 | INTRAMUSCULAR | Status: AC
Start: 2021-08-02 — End: ?

## 2021-08-02 MED ORDER — midazolam (PF) (VERSED) injection
1 | INTRAMUSCULAR | PRN
Start: 2021-08-02 — End: 2021-08-02
  Administered 2021-08-02 (×2): 1 via INTRAVENOUS

## 2021-08-02 MED ORDER — oxyCODONE (ROXICODONE) immediate release tablet 5 mg
5 | ORAL | PRN
Start: 2021-08-02 — End: 2021-08-02
  Administered 2021-08-02: 21:00:00 5 mg via ORAL

## 2021-08-02 MED ORDER — oxyCODONE (ROXICODONE) immediate release tablet 2.5 mg
5 | ORAL | PRN
Start: 2021-08-02 — End: 2021-08-02

## 2021-08-02 MED ORDER — HYDROmorphone (DILAUDID) injection Syrg 0.3 mg
0.5 | INTRAMUSCULAR | PRN
Start: 2021-08-02 — End: 2021-08-02

## 2021-08-02 MED ORDER — dexamethasone (DECADRON) 4 mg/mL injection
4 | INTRAMUSCULAR | Status: AC
Start: 2021-08-02 — End: ?

## 2021-08-02 MED ORDER — ondansetron (ZOFRAN) injection 4 mg
4 | Freq: Three times a day (TID) | INTRAMUSCULAR | PRN
Start: 2021-08-02 — End: 2021-08-02

## 2021-08-02 MED ORDER — methylene blue 1 % (10 mg/mL) injection
1 | INTRAVENOUS | PRN
Start: 2021-08-02 — End: 2021-08-02
  Administered 2021-08-02: 19:00:00 2 via INTRAMUSCULAR

## 2021-08-02 MED ORDER — peppermint oiL liquid 1 mL
PRN
Start: 2021-08-02 — End: 2021-08-02

## 2021-08-02 MED ORDER — HYDROmorphone (DILAUDID) injection Syrg 0.2 mg
0.5 | INTRAMUSCULAR | PRN
Start: 2021-08-02 — End: 2021-08-02

## 2021-08-02 MED ORDER — acetaminophen (TYLENOL) tablet 975 mg
325 | ORAL | PRN
Start: 2021-08-02 — End: 2021-08-02

## 2021-08-02 MED ORDER — succinylcholine (QUELICIN) injection
20 | INTRAMUSCULAR | PRN
Start: 2021-08-02 — End: 2021-08-02
  Administered 2021-08-02: 18:00:00 100 via INTRAVENOUS

## 2021-08-02 MED ORDER — lactated Ringers infusion
INTRAVENOUS | PRN
Start: 2021-08-02 — End: 2021-08-02
  Administered 2021-08-02 (×2): via INTRAVENOUS

## 2021-08-02 MED ORDER — propofol 10 mg/ml (DIPRIVAN) injection
10 | INTRAVENOUS | PRN
Start: 2021-08-02 — End: 2021-08-02
  Administered 2021-08-02: 18:00:00 20 via INTRAVENOUS
  Administered 2021-08-02: 18:00:00 150 via INTRAVENOUS
  Administered 2021-08-02: 19:00:00 20 via INTRAVENOUS

## 2021-08-02 MED ORDER — methocarbamoL (ROBAXIN) injection
100 | INTRAMUSCULAR | PRN
Start: 2021-08-02 — End: 2021-08-02
  Administered 2021-08-02: 19:00:00 500 via INTRAVENOUS

## 2021-08-02 MED ORDER — glucose chewable tablet 12 g
4 | ORAL | PRN
Start: 2021-08-02 — End: 2021-08-02

## 2021-08-02 MED ORDER — BUPivacaine liposome (PF) (EXPAREL) 20 mL
1.3 | Freq: Once
Start: 2021-08-02 — End: 2021-08-02

## 2021-08-02 MED ORDER — Tc-99m technetium tilmanocept (LYMPHOSEEK) 0.52 millicurie
Freq: Once | Status: AC | PRN
Start: 2021-08-02 — End: 2021-08-02
  Administered 2021-08-02: 16:00:00 0.52 via SUBCUTANEOUS

## 2021-08-02 MED ORDER — lidocaine (PF) 20 mg/mL (2 %) Soln
20 | INTRAVENOUS | PRN
Start: 2021-08-02 — End: 2021-08-02
  Administered 2021-08-02: 18:00:00 100 via INTRAVENOUS

## 2021-08-02 MED ORDER — naloxone (NARCAN) injection 0.04 mg
0.4 | INTRAMUSCULAR | PRN
Start: 2021-08-02 — End: 2021-08-02

## 2021-08-02 MED ORDER — methocarbamoL (ROBAXIN) 100 mg/mL injection
100 | INTRAMUSCULAR | Status: AC
Start: 2021-08-02 — End: ?

## 2021-08-02 MED ORDER — celecoxib (CELEBREX) capsule 400 mg
200 | ORAL | Status: AC | PRN
Start: 2021-08-02 — End: 2021-08-02
  Administered 2021-08-02: 16:00:00 400 mg via ORAL

## 2021-08-02 MED ORDER — ondansetron (ZOFRAN) injection
4 | INTRAMUSCULAR | PRN
Start: 2021-08-02 — End: 2021-08-02
  Administered 2021-08-02: 20:00:00 4 via INTRAVENOUS

## 2021-08-02 MED ORDER — EPINEPHrine 50 MCG/ 10 ML SYRINGE FOR BRADYCARDIA
PRN
Start: 2021-08-02 — End: 2021-08-02
  Administered 2021-08-02 (×2): 5 via INTRAVENOUS

## 2021-08-02 MED ORDER — BUPivacaine liposome (PF) (EXPAREL)
1.3 | PRN
Start: 2021-08-02 — End: 2021-08-02
  Administered 2021-08-02: 20:00:00 20 via PERINEURAL

## 2021-08-02 MED ORDER — gabapentin (NEURONTIN) capsule 600 mg
300 | ORAL | PRN
Start: 2021-08-02 — End: 2021-08-02

## 2021-08-02 MED ORDER — ceFAZolin (ANCEF) 2 g in sodium chloride 0.9% 100 mL ADDaptor IVPB
INTRAVENOUS | Status: AC | PRN
Start: 2021-08-02 — End: 2021-08-02
  Administered 2021-08-02: 18:00:00 2 g via INTRAVENOUS

## 2021-08-02 MED ORDER — gabapentin (NEURONTIN) capsule 600 mg
300 | ORAL | Status: AC | PRN
Start: 2021-08-02 — End: 2021-08-02
  Administered 2021-08-02: 16:00:00 600 mg via ORAL

## 2021-08-02 MED ORDER — traMADoL (ULTRAM) 50 mg tablet
50 | ORAL_TABLET | Freq: Four times a day (QID) | ORAL | 0 refills | Status: AC | PRN
Start: 2021-08-02 — End: 2021-08-07
  Filled 2021-08-02: qty 10, 5d supply, fill #0

## 2021-08-02 MED ORDER — fentaNYL (SUBLIMAZE) injection 25 mcg
50 | INTRAMUSCULAR | PRN
Start: 2021-08-02 — End: 2021-08-02

## 2021-08-02 MED FILL — FENTANYL (PF) 50 MCG/ML INJECTION SOLUTION: 50 50 mcg/mL | INTRAMUSCULAR | Qty: 2

## 2021-08-02 MED FILL — OXYCODONE 5 MG TABLET: 5 5 MG | ORAL | Qty: 1

## 2021-08-02 MED FILL — EXPAREL (PF) 1.3 % (13.3 MG/ML) SUSPENSION FOR LOCAL INFILTRATION: 1.3 1.3 % (13.3 mg/mL) | Qty: 20

## 2021-08-02 MED FILL — CELEBREX 200 MG CAPSULE: 200 200 mg | ORAL | Qty: 2

## 2021-08-02 MED FILL — ROBAXIN 100 MG/ML INJECTION SOLUTION: 100 100 mg/mL | INTRAMUSCULAR | Qty: 10

## 2021-08-02 MED FILL — MIDAZOLAM (PF) 1 MG/ML INJECTION SOLUTION: 1 1 mg/mL | INTRAMUSCULAR | Qty: 2

## 2021-08-02 MED FILL — CEFAZOLIN 2 GRAM SOLUTION FOR INJECTION: 2 2 gram | INTRAMUSCULAR | Qty: 1

## 2021-08-02 MED FILL — GABAPENTIN 300 MG CAPSULE: 300 300 MG | ORAL | Qty: 2

## 2021-08-02 MED FILL — PROPOFOL 10 MG/ML INTRAVENOUS EMULSION: 10 10 mg/mL | INTRAVENOUS | Qty: 20

## 2021-08-02 MED FILL — TYLENOL 325 MG TABLET: 325 325 mg | ORAL | Qty: 3

## 2021-08-02 MED FILL — DEXAMETHASONE SODIUM PHOSPHATE 4 MG/ML INJECTION SOLUTION: 4 4 mg/mL | INTRAMUSCULAR | Qty: 1

## 2021-08-02 NOTE — Unmapped (Signed)
Anesthesia Post Note    Patient: Sarah Huang    Procedure(s) Performed: Procedure(s) with comments:  RIGHT BREAST EXCISION OF RECURRENCE, EXCISION OF  RIGHT RIGHT BREAST SKIN LESION, SMARTCLIP LOCALIZED EXCISION OF CLIPPED AXILLARY LYMPH NODE - RIGHT BREAST EXCISION OF RECURRENCE, EXCISION OF  RIGHT RIGHT BREAST SKIN LESION, SMARTCLIP LOCALIZED EXCISION OF CLIPPED AXILLARY LYMPH NODE  RIGHT AXILLARY SENTINEL LYMPH NODE BIOPSY, - RIGHT AXILLARY SENTINEL LYMPH NODE BIOPSY,    Anesthesia type: general endotracheal    Patient location: PACU    Airway: Patent    Post pain: Adequate analgesia    Nausea / Vomiting: Absent    Post-operative Hydration Status: Adequate    Post assessment: no apparent anesthetic complications, tolerated procedure well and no evidence of recall    Last Vitals:   Vitals:    08/02/21 1647 08/02/21 1700 08/02/21 1715 08/02/21 1730   BP: 170/69 183/76 170/74 168/75   BP Location:       Patient Position:       Pulse: 77 70 71 70   Resp:  15 16 14    Temp:       TempSrc:       SpO2: 93% 92% 93% 95%   Weight:       Height:            Post vital signs: stable    Level of consciousness: awake and alert     Complications:  There were no known notable events for this encounter.

## 2021-08-02 NOTE — Unmapped (Signed)
Patient arrived to SDS From pacu.  Placed on Monitor  VSS  Denies Pain  Family to Bedside  AVS printed and discharge instructions reviewed and all questions answered.  Given fluids to drink  Meets discharge criteria  PIV removed  Discharged via wheelchair

## 2021-08-02 NOTE — Unmapped (Addendum)
PACU / SDS Patient Care Handoff    Patient: Sarah Huang,  66 y.o.,  female        PACU RN: Frances Maywood: 6      ASCOM #: [phone]      Anesthesia type:  General      Sign Out:  Yes -resident to do       Discharge Order/ Instructions:  Yes     Procedure(s) Performed: Procedure(s) with comments:  RIGHT BREAST EXCISION OF RECURRENCE, EXCISION OF  RIGHT RIGHT BREAST SKIN LESION, SMARTCLIP LOCALIZED EXCISION OF CLIPPED AXILLARY LYMPH NODE - RIGHT BREAST EXCISION OF RECURRENCE, EXCISION OF  RIGHT RIGHT BREAST SKIN LESION, SMARTCLIP LOCALIZED EXCISION OF CLIPPED AXILLARY LYMPH NODE  RIGHT AXILLARY SENTINEL LYMPH NODE BIOPSY, - RIGHT AXILLARY SENTINEL LYMPH NODE BIOPSY,    Surgeon: Surgeon(s):  Neill Loft, MD    Team: Gen Surg    Allergies:   Allergies   Allergen Reactions    Statins-Hmg-Coa Reductase Inhibitors      Other reaction(s): Other reaction  severe leg cramps    Crestor [Rosuvastatin] Other (See Comments)     Causes tightening in leg muscles.       Diabetic / Spine: No     Surgical Site: R breast    Pain Score: Seven        Nerve Block: No    Nauseated: No     Current Vital Signs:   Vitals:    08/02/21 1730   BP: 168/75   Pulse: 70   Resp: 14   Temp:    SpO2: 95%       Meds given in PACU:  Fentanyl 0 mcg   Dilaudid 0 mg   Morphine 0 mg  PO 5 mg oxy  PIV:   Peripheral IV 06/19/13 Left Hand (Active)       Peripheral IV 08/02/21 Anterior;Left Hand (Active)   Site Assessment No problems identified;Clean;Dry;Intact 08/02/21 1710   Line Status Infusing 08/02/21 1710   Dressing Status Clean;Dry;Intact 08/02/21 1710     IVF: lactated Ringer's  Left to infuse: 500    Tylenol given:  Pre-op    Void:  No     Tolerating PO: Yes          Amount 50 ml        Comment / Concerns / Additional Info:sleepy from anesthesia ok to go home

## 2021-08-02 NOTE — Unmapped (Signed)
INTRA-OP POST BRIEFING NOTE: Sarah Huang      Specimens:   Specimens     ID Source Type Tests Collected By Collected At Frozen? Attributes Order ID Breast Spec Formalin Marked as Sent    A Tissue Tissue  SURGICAL PATHOLOGY EXAM   Neill Loft, MD 08/02/21 1442 Yes Frozen Section  [id number]        Comment: A) Right axillary clipped node    B Tissue Tissue  SURGICAL PATHOLOGY EXAM   Neill Loft, MD 08/02/21 1524 No Fresh  [id number]        Comment: B) Right breast cancer, short superior, long lateral, skin anterior, ink posterior          Prior to leaving the room: Nurse confirmed name of procedure, completion of instrument, sponge & needle counts, reads specimen labels aloud including patient name and addresses any equipment issues? Nurse confirmed wound class. Nurse to surgeon and anesthesia: What are key concerns for recovery and management of the patient?  Yes      Blood products stored at appropriate temperatures prior to return to blood bank (if applicable)? N/A      Patient identification band secured on patient prior to transfer out of the operating room? Yes    Temporary devices implanted for the duration of the surgery removed and evaluated for intactness and completeness prior to closure? N/A      Other Comments:     SignedLujean Rave    Date: 08/02/2021    Time: 3:39 PM

## 2021-08-02 NOTE — Unmapped (Signed)
H&P reviewed, patient examined, no changes to H&P.

## 2021-08-02 NOTE — Unmapped (Signed)
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, estrogen receptor positive, unspecified laterality (CMS-HCC) [C50.219, Z17.0]       Post-op Diagnosis: Same    Procedure(s) with comments:  RIGHT BREAST EXCISION OF RECURRENCE, EXCISION OF  RIGHT RIGHT BREAST SKIN LESION, SMARTCLIP LOCALIZED EXCISION OF CLIPPED AXILLARY LYMPH NODE - RIGHT BREAST EXCISION OF RECURRENCE, EXCISION OF  RIGHT RIGHT BREAST SKIN LESION, SMARTCLIP LOCALIZED EXCISION OF CLIPPED AXILLARY LYMPH NODE  RIGHT AXILLARY SENTINEL LYMPH NODE BIOPSY, - RIGHT AXILLARY SENTINEL LYMPH NODE BIOPSY,      Surgeon(s):  Neill Loft, MD    Anesthesia: General    Staff:   Circulator: Lujean Rave, RN  Relief Scrub: Elliot Cousin  Scrub Person: Rudene Re, RN  Float: Ollen Bowl, RN  Resident: Lady Gary, MD    Estimated Blood Loss: Minimal                 Specimens:   Specimens     ID Description Commments Type Source Tests Collected By Collected At    A A) Right axillary clipped node A) Right axillary clipped node Tissue Tissue  SURGICAL PATHOLOGY Elmarie Shiley, MD 08/02/21 1442    B B) Right breast cancer, short superior, long lateral, skin anterior, ink posterior B) Right breast cancer, short superior, long lateral, skin anterior, ink posterior Tissue Tissue  SURGICAL PATHOLOGY EXAM   Neill Loft, MD 08/02/21 1524                 Drains:   Drain 1 Breast Right (Active)   Number of days: 2966       Drain 2 Breast Right (Active)   Number of days: 2966       Drain 1 Breast Left (Active)   Number of days: 2966       Drain 2 Breast Left (Active)   Number of days: 2966        Sentinel Node Biopsy for Breast Cancer  Operation performed with curative intent Yes   Tracer(s)  used to identify sentinel nodes in the upfront surgery (non-neoadjuvant) setting Dye and Radioactive tracer   Tracer(s) used to identify sentinel nodes in the neoadjuvant setting N/A   All nodes (colored or non-colored) present at the end of a dye-filled lymphatic channel were removed Yes   All significantly radioactive nodes were removed Yes   All palpably suspicious nodes were removed Yes   Biopsy-proven positive nodes marked with clips prior to chemotherapy were identified and removed N/A              There were no complications unless listed below.         Neill Loft     Date: 08/02/2021  Time: 3:43 PM

## 2021-08-02 NOTE — Unmapped (Signed)
Anesthesia Extubation Criteria:    Airway Device: endotracheal tube    Emergence Details:      Smooth      _x_      Stormy       __       Prolonged   __     Extubation Criteria:      Motor strength intact       _x_      Follows commands        _x_      Good airway reflexes      _x_      OP suctioned                  _x_        Follows commands:  Yes     Patient extubated:  Yes

## 2021-08-02 NOTE — Unmapped (Signed)
Breast Surgery Discharge Instructions  General Information:  You will have a clear glue dressing or steri-strips covered with gauze and adhesive dressing/tape on your incision. A small amount of blood may be present - this is normal.  If you have had a sentinel lymph node biopsy and blue dye was used, your breast/urine/bowel movements may be blue or green for a few days.  Your final pathology will not be available for 5-7 business days (occasionally longer). This will be discussed with you at your first post-operative visit.    Care of your incisions:  If you have a dressing:  Remove it the first day after surgery, leave steri-strips in place  Keep it in place until first post-op visit  You may shower (do not scrub at incisions, okay to allow water to run over them) 24-48 hours after surgery  Once you have showered, pat incisions dry and leave steri-strips/glue in place.  No submersing in bath, hot tub, swimming pool, or lake for 6 weeks.  Do not use anti-perspirant under your arm for at least 2 weeks if you have an incision here. Baby powder and deodorant are okay.  If you had a lumpectomy or biopsy, you may be discharged in a surgical bra. Wear this for 24 hours. You may then wear a soft supportive bra (i.e. sports bra or similar) 24 hours a day for 10-14 days to minimize discomfort, bleeding, and bruising.  If you had a mastectomy without reconstruction, you will be discharged home in a surgical bra. This may be removed after 24 hours.   You can expect your breast to be sore and swollen for 2-3 weeks after surgery. It is normal to develop fluid or thickening inside the breast.  Ice packs may be applied to the incisions for 20 minutes per hour for 48 hours while awake to help with pain and swelling.  Wear breast binder and/or post surgical camisole as instructed by your physician.    Medications:  You may take the following medications for pain unless you have been told otherwise:  Acetaminophen 1000 mg every 8  hours as needed for pain (do not take WITH another pain medication that contains acetaminophen).  Ibuprofen 800 mg every 8 hours as needed for pain   Other medications as prescribed by your surgeon. Tramadol 25mg  may be taken for severe pain not controlled by tylenol and ibuprofen.  Sample medication schedule:   0600 1000 1200 1400 1800 2200 0200   Tylenol X   X  X    Ibuprofen  X   X  X (If awake)   Tramadol  Every 6hrs as needed        Activities:  You may resume light activities and a regular diet. Do not lift anything heavier than a half-gallon of milk for at least 2 weeks.  Arm range of motion exercises are encouraged.  You may not drive  For 24 hours after anesthesia  While taking pain medicine other than acetaminophen/ibuprofen  You may return to work upon the discretion of Designer, industrial/product.  Typically 48 hours but may be longer based on the procedure.     When to call the surgical team:  If your breast becomes increasingly red, sore, or warm to the touch.  If you develop a fever greater than 101.5.  If you experience chills, nausea, and/or vomiting.  If your breast becomes very swollen or hard with bruising.  If your incision starts to drain fluid or blood.  If you need medical assistance:  During business hours (8am-5pm), call [phone] and ask for your surgeon. You may be routed to the medical secretary or nurse.  After business hours, call [phone] to be connected to the answering service and the on call breast surgeon.  If you have had breast reconstruction, such as tissue expanders or a TRAM or DIEP flap, please call [phone] and ask for your plastic surgery team first. They will address concerns about your drains, wound care, and antibiotics. If you are unable to reach them or they are unable to address your concern, please call the breast surgeon.    Office locations and follow up:  [facility]; [address] [place], [place] [address] or   [facility]  [place]; [address]; 4th floor [place] [address]    Please call the office at [phone] if you need to reschedule

## 2021-08-02 NOTE — Unmapped (Signed)
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 Right breast palpation guided excision of recurrence  2. Right axillary intraoperative lymphatic mapping  3. Right smartclip guided excision of clipped lymph node  4. Right axillary exploration    Indications For Procedure:   ANDRENA MARGERUM is a 66 y.o.-year-old female who presented with a right breast recurrent cancer, a palpable lesion overlying her implant in the UOQ. She also had an abnormal low axillary/intramammary node which was possibly PET-avid and therefore this was clipped and to be excised.      Findings At Operation:   I utilized the smartclip breast seed to guide biopsy of the node. I used palpation to guide excision of the recurrence.   The specimen was excised, oriented (short stitch superior, long stitch lateral, skin anterior, and ink marks posterior margin), and sent to pathology for evaluation after specimen radiograph.  The seed was noted within the node specimen on radiograph.  The node was negative for malignancy on frozen section. The axilla did not contain any hot or blue sentinel nodes and because it was without apparent disease and the low axilla/intrammary node was negative on frozen, a completion axillary dissection was not pursued.    Details Of The Procedure:  After informed consent was obtained and  IRLENE CRUDUP was properly identified, she was taken to the operating room, placed on the table in supine position and induced under general anesthesia. The operative field was prepped and draped in the usual sterile fashion, including the anatomic boundaries of the right reconstructed breast.      The clip in the right axilla/lateral breast was located and an incision was planned adjacent to this. An incision was made with a sharp blade. Electrocautery was used to make skin flaps. The clipped node was identified and excised. Specimen radiograph confirmed the smartclip and the node had a count of 30. This was sent for frozen section. The axilla was then scanned without obvious signal. The clavipectoral fascia was opened and the axilla again scanned for a technetium signal and inspected for blue nodes and there were none. I palpated the axillary contents and there were no palpable nodes. The wound was irrigated and hemostasis obtained and we then turned our attention to the breast lesion.    A skin incision was made around the involved skin in an ellipse and the lesion of the right breast using a sharp blade, and dissection was carried down using electrocautery.  Flaps were created superiorly, medially, inferiorly, and laterally surrounding the lesion.  The specimen was then excised to include the pectoral fascia posteriorly.       The right breast recurrent cancer specimen was marked with a short stitch at the superior margin, a long stitch at the lateral margin,  and ink at the posterior margin.  This was sent to pathology for evaluation after specimen radiograph, which showed the mass in the center of the specimen with normal surrounding tissue.       Wounds were thoroughly irrigated with warm saline and suctioned dry and hemostasis was achieved.  Clips were placed on the posterior aspect of the cavity to facilitate localization.       The intramammary/low axillary clipped node was negative on frozen so an  axillary dissection was not performed. The wounds were again irrigated and hemostasis assured. 10cc of Exparel was infiltrated into both wounds and the wounds were closed with interrupted 3-0 vicryls and a running 4-0 monocryl, followed by Dermabond.     AMARILYS LYLES tolerated the procedure well without complications.  Sponge, needle, and instrument counts were reported as correct at the end of procedure.     Complications:   None immedaite    Estimated Blood Loss:  20 ml    Implants:  None    Drains Placed:   None    Specimens:   ID Type Source Tests Collected by Time Destination   A : A) Right axillary clipped node Tissue Tissue SURGICAL PATHOLOGY EXAM Neill Loft, MD 08/02/2021  2:42 PM    B : B) Right breast cancer, short superior, long lateral, skin anterior, ink posterior Tissue Tissue SURGICAL PATHOLOGY EXAM Neill Loft, MD 08/02/2021  3:24 PM        Disposition:   PACU - hemodynamically stable.    Attestation:  As the attending surgeon, I was present for all critical portions of the procedure.       Sentinel Node Biopsy for Breast Cancer  Operation performed with curative intent Yes   Tracer(s) used to identify sentinel nodes in the upfront surgery (non-neoadjuvant) setting Dye and Radioactive tracer   Tracer(s) used to identify sentinel nodes in the neoadjuvant setting N/A   All nodes (colored or non-colored) present at the end of a dye-filled lymphatic channel were removed Yes   All significantly radioactive nodes were removed Yes   All palpably suspicious nodes were removed Yes   Biopsy-proven positive nodes marked with clips prior to chemotherapy were identified and removed N/A

## 2021-08-02 NOTE — Unmapped (Signed)
Anesthesia Transfer of Care Note    Patient: Sarah Huang  Procedure(s) Performed: Procedure(s) with comments:  RIGHT BREAST EXCISION OF RECURRENCE, EXCISION OF  RIGHT RIGHT BREAST SKIN LESION, SMARTCLIP LOCALIZED EXCISION OF CLIPPED AXILLARY LYMPH NODE - RIGHT BREAST EXCISION OF RECURRENCE, EXCISION OF  RIGHT RIGHT BREAST SKIN LESION, SMARTCLIP LOCALIZED EXCISION OF CLIPPED AXILLARY LYMPH NODE  RIGHT AXILLARY SENTINEL LYMPH NODE BIOPSY, - RIGHT AXILLARY SENTINEL LYMPH NODE BIOPSY,    Patient location: PACU    Anesthesia type: general endotracheal    Airway Device on Arrival to PACU/ICU: Nasal Cannula    IV Access: Peripheral    Monitors Recommended to be Used During PACU/ICU: Standard Monitors    Outstanding Issues to Address: None    Level of Consciousness: awake    Post vital signs:    Vitals:    08/02/21 1613   BP: 170/64   Pulse: 94   Resp: 14   Temp: (P) 97.1 F (36.2 C)   SpO2: 94%       Complications:  There were no known notable events for this encounter.    Date 08/01/21 1500 - 08/02/21 0659(Not Admitted) 08/02/21 0700 - 08/03/21 0659   Shift 1500-2259 2300-0659 24 Hour Total 0700-1459 1500-2259 2300-0659 24 Hour Total   INTAKE   I.V.(mL/kg)     1200(13.5)  1200(13.5)     Volume (mL) (lactated Ringers infusion)     1200  1200   IV Piggyback    100   100     Volume (mL) (ceFAZolin (ANCEF) 2 g in sodium chloride 0.9% 100 mL ADDaptor IVPB)    100   100   Shift Total(mL/kg)    100(1.1) 1200(13.5)  1300(14.6)   OUTPUT   Shift Total(mL/kg)          Weight (kg) 88.9 88.9 88.9 88.9 88.9 88.9 88.9

## 2021-08-11 NOTE — Unmapped (Signed)
Pt called back and scheduled consult on 8/10 at 11 am with Dr.Sittenfeld

## 2021-08-19 ENCOUNTER — Ambulatory Visit: Admit: 2021-08-19 | Discharge: 2021-08-19 | Payer: PRIVATE HEALTH INSURANCE

## 2021-08-19 DIAGNOSIS — C50911 Malignant neoplasm of unspecified site of right female breast: Secondary | ICD-10-CM

## 2021-08-19 DIAGNOSIS — Z483 Aftercare following surgery for neoplasm: Secondary | ICD-10-CM

## 2021-08-19 NOTE — Unmapped (Signed)
BREAST SURGICAL ONCOLOGY POST-OP NOTE    Patient: Sarah Huang Age: 66 y.o.  MRN: [id number] Provider: Neill Loft, MD    REASON FOR VISIT: Post-operative assessment    HISTORY OF PRESENT ILLNESS:  Sarah Huang is a 66 y.o. female recurrent IDC ER/PR pos, HER2 negative. She is now s/p right breast excision of recurrence, smartclip localized excision of clipped axillary lymph node, right axillary sentinel lymph node biopsy on 08/02/21. She presents today for post op assessment and in office removal of right breast skin lesion.        INTERIM HISTORY:  Patient states that she is doing well. Her pain has been well controlled. She is not still requiring pain medications. She denies any fever/chills, or nausea/vomiting, diarrhea/constipation or any chest pain or shortness of breath. She denies any erythema or drainage from her incision. Her range of motion of her arm is normal. She wants to know when she can get in the pool again. She went home to [facility] and then flew back for today's appointments- she will also meet with Dr. Carmell Austria today for radiation recommendations, she will meet with med onc, Dr. Leotis Shames in [place] to discuss endocrine therapy.    ALLERGIES:  Allergies   Allergen Reactions    Statins-Hmg-Coa Reductase Inhibitors      Other reaction(s): Other reaction  severe leg cramps    Crestor [Rosuvastatin] Other (See Comments)     Causes tightening in leg muscles.        VITAL SIGNS:  Vitals:    08/19/21 0806   BP: 150/64   Pulse: 63   Resp: 20   Temp: 96.7 F (35.9 C)   SpO2: 100%       PHYSICAL EXAM:  GENERAL:  The patient is not in acute distress.    CHEST: Normal effort on RA  EXTREMITIES:  No evidence of cyanosis or edema.  NEUROLOGIC:  The patient is awake and alert and oriented x 3.  Cranial nerves are grossly intact.  BREASTS: focused examination reveals healing right breast and right axillary wounds, c/d/I, without erythema, warmth, or ecchymosis    She has a small mole, ~0.5cm in her central  right chest which was excised today in clinic- see procedure note    PATHOLOGY:  Clinical History:   Right breast excision of recurrence, excision of right   breast skin lesion, smartclip localized excision of clipped axillary lymph   node, right axillary sentinel lymph node biopsy, possible completion   axillary dissection   Pre-Operative Diagnosis: malignant neoplasm of upper-inner quadrant of   breast in female, estrogen receptor positive, unspecified laterality   Post-Operative Diagnosis:     malignant neoplasm of upper-inner quadrant of   breast in female, estrogen receptor positive, unspecified laterality   Specimen(s) Submitted:   A. right axillary clipped node; B. right breast   cancer, SSS, LSL, skin anterior, ink posterior   CPT Code(s):   88307 X 2; 88331 X 2; 88342 X 1   Additional Information:     FINAL DIAGNOSIS:     A. Lymph node, right axilla, biopsy:        - One benign lymph node negative for metastatic carcinoma (0/1)   (confirmed by IHC).        - Prior biopsy site changes are present.     B. Right breast, partial mastectomy:        - Invasive ductal carcinoma (1.3 cm), consistent with recurrence of   patient's known breast cancer.        -  Tumor involves dermis; no epidermal ulceration identified.        - Surgical resection margins are negative; invasive carcinoma is 2 mm   from deep margin.        - Prior biopsy site changes are present.   ___________________________________________________________________________   __________   Frozen Section Diagnosis:     FSA:   - One lymph node, negative for carcinoma.     The above diagnosis was verbally reported to Dr. Ernst Breach over the phone at   3:20 p.m. on 08/02/2021.       ASSESSMENT: Ms.Sarah Huang is a 66 y.o. female with a [id number] right breast invasive ductal carcinoma, s/p excision of local recurrence and concerning lymph node, attempted repeat SLNbx -did not map- on 08/02/21 presents today for a post-operative assessment and is doing well  from a surgical standpoint without any obvious signs of infection or complications. Pathology revealed a [id number] right breast IDC and the clipped node was negative for disease. Skin lesion removed with full thickness biopsy today- path pending.      PLAN:  1. The results of the pathology results were discussed in great detail with the patient. A copy of the pathology report was given to the patient to keep for her own records.   2. Patient understands that no further surgical intervention is required at this time. Therefore, she will be placed in surveillance status in breast surgery clinic and will follow up in 6 months with new imaging as indicated.  3. Follow up with breast medical oncology, Dr. Leotis Shames in [place] for systemic cancer treatment care and surveillance.   4. Follow up with radiation oncology, appointment today.      Neill Loft, MD, MS

## 2021-08-19 NOTE — Unmapped (Signed)
[facility]    Medical Record Number:   [id number]    Patient Name:   [name]    Attending Surgeon:   Edgardo Roys, MD, MS    Pre-operative Diagnosis:  Right chest wall skin lesion    Post-operative Diagnosis:  * No surgery found *    Anesthesia:   Local    Title of Operation:  Right chest wall skin lesion punch biopsy    Indications For Procedure:   Sarah Huang is a 66 y.o. female who presented with a right breast/chest wall skin lesion that she is concerned about and requested excision of.      Details Of The Procedure:  After informed consent was obtained and ABEEHA TWIST was properly identified, she was taken to the procedure room, placed on the table in supine position. A time out was performed.    Local, 1% lidocaine, 3cc was infiltrated around the skin lesion. A 5mm punch biopsy was performed and sent for permanent pathology.    Hemostasis was achieved with pressure and then a 4-0 monocryl was used to close the wound, followed by Dermabond.    NALAH MACIOCE tolerated the procedure well without complications.      Complications:   None    Estimated Blood Loss:  2 ml    Implants:  None    Drains Placed:   None

## 2021-08-19 NOTE — Unmapped (Signed)
Review of Systems   All other systems reviewed and are negative.

## 2021-08-19 NOTE — Unmapped (Signed)
Date of Service: 08/19/2021  Requesting Physician: Dr. Ernst Breach    Reason for Consultation/Diagnosis:   Evaluation for radiation therapy for right breast recurrent IDC, ER/PR+, HER2-.    Previous Treatments  06/19/2013 - Surgery: B/L total mastectomy, right breast pT1bN0(sn)Mx  08/02/2021 - Surgery: Lumpectomy and SLNBx    History of Present Illness  Sarah Huang is a 66 y.o. woman with a history of right sided pT1bN0(sn) invasive ductal carcinoma ER/PR+, HER2- s/p b/l mastectomy and reconstruction in June 2015 with endocrine therapy for a year following, now with recurrence in same side breast. She had moved to [place] and had a whole body CT scan done in Jan of this year that had a LUL nodular density of concern. She also had noticed a lump at the prior biopsy site that had increased in size for 89mo. Then had a PET scan performed that revealed small avid focus of skin thickening and nodularity in upper outer right breast, LUL not avid, also had FDG uptake in right tonsil. ENT evaluated tonsil with no further concern. Was seen by breast surgeon in [facility] with firm mass suspicious for dermal recurrence over right reconstructed breast and breast MRI was performed. MRI revealed  irregular 9 mm mass in the upper inner quadrant of the right breast (1:00) and a second enhancing mass in the upper outer quadrant of the right breast probably an enlarged lymph node. Right axillary LN was biopsied 6/23 and was negative for malignancy. She then had a excision with SLNBx 08/02/21. Final pathology with 1.3cm of invasive ductal carcinoma involving the dermis with all margins negative and negative lymph node. She has been healing well from surgery. She has some mild intermittent nerve pain in the incision site but is not requiring any pain medicine. She denies any swelling in breast/arm but does have a small firm area under incision scar. She doesn't have any issues with ROM in arm. She did have a small skin lesion she was concerned  about on the right inner upper breast that Dr. Ernst Breach biopsied earlier today. She has been flying back and forth from [facility] for her appointments and is planning to get radiation in [facility] if possible.      Past Medical History   She  has a past medical history of Cancer (CMS-HCC) (06/2013), Constipation (06/17/2013), Depression, Dermatitis, Dyslipidemia, GERD (gastroesophageal reflux disease), Hyperlipidemia, Kidney stones, Psoriasis, Sleep apnea (06/17/2013), and Urinary tract infection.    Past Surgical History  She  has a past surgical history that includes Tonsillectomy; Salpingoophorectomy (2004); Sinus surgery; mid urethral sling  (2002); Esophagogastroduodenoscopy (N/A, 05/29/2012); Colonoscopy (N/A, 05/29/2012); Colonoscopy (N/A, 06/27/2012); Hysterectomy (0220); remove breast tissue expanders insertion implants (Bilateral, 06/19/2013); dissection axillary (Right, 06/19/2013); Breast surgery (06/19/13); remove breast tissue expanders insertion implants (Bilateral, 11/26/2013); Breast biopsy (Right, 08/02/2021); and biopsy node sentinel (Right, 08/02/2021).    Social History  She  reports that she quit smoking about 31 years ago. Her smoking use included cigarettes. She has never used smokeless tobacco. She reports that she does not currently use alcohol. She reports that she does not use drugs.    Family History  Her family history includes Breast Cancer in her paternal aunt and other family members; COPD in her brother, father, mother, sister, and another family member; Coronary artery disease in her father and mother; Diabetes in her mother; Emphysema in her father; Heart attack in her brother; Heart disease in her brother and mother; Heart failure in her father; Hyperlipidemia in her father and mother;  Hypertension in her mother; Other in her brother and sister; Psoriasis in her brother and sister.    Contraindications to Radiation Therapy  There is no history of prior radiation therapy. There is no history of lupus,  scleroderma, or other connective tissue disorders.  No implanted devices.    Current Outpatient Medications   Medication Sig Dispense Refill    ascorbic acid, vitamin C, (VITAMIN C) 500 MG tablet Take 1 tablet (500 mg total) by mouth daily.      cyanocobalamin, vitamin B-12, 1,000 mcg Subl Dissolve one tablet under tongue daily 100 tablet 3    ergocalciferol (ERGOCALCIFEROL) 1,250 mcg (50,000 unit) capsule TAKE 1 CAPSULE BY MOUTH 1 TIME A WEEK 12 capsule 3    ibuprofen (MOTRIN) 800 MG tablet Take 1 tablet (800 mg total) by mouth every 8 hours as needed. 270 tablet 0    loratadine (CLARITIN) 10 mg tablet Take 1 tablet (10 mg total) by mouth daily.      omeprazole (PRILOSEC) 40 MG capsule TAKE 1 CAPSULE(40 MG) BY MOUTH EVERY MORNING BEFORE BREAKFAST 90 capsule 1    SUMAtriptan (IMITREX) 100 MG tablet Take one tablet po daily as needed for migraine, may repeat in 2 hours if needed in 24 hours. 12 tablet 2    TURMERIC ORAL Take by mouth.      UNABLE TO FIND Take by mouth daily. Med Name: Tomasa Blase off ? mg       No current facility-administered medications for this visit.       Allergies   Allergen Reactions    Statins-Hmg-Coa Reductase Inhibitors      Other reaction(s): Other reaction  severe leg cramps    Crestor [Rosuvastatin] Other (See Comments)     Causes tightening in leg muscles.       Review of Systems  As per HPI    Vital signs:   Vitals:    08/19/21 0900   BP: 150/64   Pulse: 63   Resp: 20   Temp: 96.7 F (35.9 C)   SpO2: 100%        KPS:(90) Able to perform normal activity, minor signs and symptoms of disease.    Physical Exam  Constitutional:       Appearance: Normal appearance. She is normal weight.   Eyes:      General: No scleral icterus.     Extraocular Movements: Extraocular movements intact.      Conjunctiva/sclera: Conjunctivae normal.   Cardiovascular:      Rate and Rhythm: Normal rate.   Pulmonary:      Effort: Pulmonary effort is normal. No respiratory distress.   Chest:          Comments:  Central right breast incisional site healing well with a small area of scabbed skin in upper inner breast where biopsy was taken earlier today  Skin:     General: Skin is warm and dry.      Coloration: Skin is not jaundiced.   Neurological:      General: No focal deficit present.      Mental Status: She is alert and oriented to person, place, and time. Mental status is at baseline.   Psychiatric:         Mood and Affect: Mood normal.         Behavior: Behavior normal.         Thought Content: Thought content normal.         Diagnostic Studies Reviewed    Imaging  Images were personally reviewed and the pertinent results were discussed with the patient in the context of the oncologic diagnosis. See HPI.    02/17/2021 PET Scan  Discussion:  Head:  Normal FDG uptake in the visualized brain.  Neck:  No pathologically enlarged or metabolically active cervical adenopathy is present. There is asymmetric FDG uptake in the right lingual tonsil SUV Max 4.5.  Chest:  Bilateral breast prostheses are present. In the skin of the right breast superolaterally is a small focus of skin thickening and 0.3 cm nodularity SUV max 1.0 (image 71).  No pathologically enlarged or metabolically active mediastinal, hilar or axillary adenopathy is present.  In the left upper lobe is a 0.7 cm nodular focus and/or area of scarring without significant FDG uptake. Consider continued CT chest surveillance.  Abdomen/pelvis:  The liver, gallbladder, spleen, pancreas and adrenals demonstrate no abnormal foci of FDG uptake. No pathologically enlarged or metabolically active upper abdominal or retroperitoneal adenopathy is present. There is radiotracer excretion into the bilateral renal collecting systems and  into the urinary bladder.  Normal bowel activity is present. No FDG avid pelvic adenopathy is present.  Musculoskeletal: No FDG avid osseous lesions are present.  Impression:  1. Left upper lobe nodule focus and/or area of scarring without  significant FDG uptake. Consider continued CT chest surveillance.  2. Small focus of skin thickening and subcm nodularity in the superolateral right breast with low-grade FDG uptake. This is nonspecific. Recommend direct visualization.  3. Asymmetric FDG uptake of the right lingual tonsil. Consider direct visualization.  Addendum: Images were reviewed. The activity described as right lingual tonsil should say asymmetric FDG uptake of the right base of tongue SU Vmax 4.5. Consider direct visualization.     04/08/2021 MRI b/l breast    Bilateral reconstructed breasts.   BIRADS 0.  FINDINGS:  Right breast:  There are there is increased uptake 5 PET scan on the right breast there is dermal enhancement and subdermal enhancement with irregular margins best demonstrated on subtraction image 67 with type I kinetics.  This is considered suspicious and is identified in the upper inner quadrant towards 1:00 measuring 9 mm. This is approximately 3 cm from the nipple. Targeted ultrasound and correlation with clinical findings recommended.  In addition to this there is an enhancing mass possibly an enlarged lymph node lateral to the nipple on subtraction image 82 towards 9:00-10:00 measuring 7 mm, targeted ultrasound of this finding is recommended, this is identified 2.6 cm from the nipple.  A right breast implant appears intact without evidence for rupture or pericapsular fluid collection. There are no suspicious lymph nodes in the right axilla.  Left breast:  A left breast implant appears intact without evidence for rupture or deflation.  No pericapsular collections are demonstrated.  No masslike or nonmass enhancement. No suspicious lymphadenopathy in the axilla.  No adenopathy along bilateral internal mammary chains.  IMPRESSION:  Suspicious enhancement with irregular margins 9 mm in the upper inner quadrant of the right breast in proximity to the nipple for which targeted ultrasound recommended directed towards  1:00.  Suspicious enhancing mass in the upper outer quadrant of the right breast probably an enlarged lymph node for which targeted ultrasound recommended directed to the right-10:00.     Pathology  Pathology was personally reviewed with results as discussed in the history of present illness. See HPI.    Labs  Lab Results   Component Value Date    WBC 6.9 10/01/2018    HGB  12.8 10/01/2018    HCT 38.0 10/01/2018    MCV 90.0 10/01/2018    PLT 269 10/01/2018     Lab Results   Component Value Date    BUN 16 10/01/2018     Lab Results   Component Value Date    CREATININE 0.65 10/01/2018       Assessment  Ms. Folse is a 66 y.o. woman with a diagnosis of recurrent right sided IDC, ER/PR+, HER2- (rcT1cNXM0) s.p b/l mastectomy and reconstruction in 2015 now s/p resection and SLNBx 7/24 with final pathology with negative margins and sentinel lymph node. Clinically doing well.     Plan  Due to recurrent invasive ductal carcinoma of the right breast/chest wall, we recommend treatment of right chest wall with regional nodes as well with radiotherapy to a dose of 50 Gy in 25 fractions. We would plan to include axillary, SCV and IMN nodes as well, assuming can appropriately spare OARs, as the recurrence was located fairly centrally. Would plan to treat with deep inspiration breath hold to help minimize dose to lung/heart while including the IMN nodes.     We reviewed with Ms. Bujak what a typical course of radiation would entail. Ms. Casino was made aware that treatment extends over a several-week time period and is given Monday through Friday, 5 days per week. The acute side effects of radiotherapy and potential late toxicities were reviewed in detail. During the course of radiation, acute side effects could include, but are not limited to fatigue, skin reaction, possible esophagitis. The typical timeline for the resolution of these effects as well as available supportive therapies were reviewed. Potential long term side  effects can include, but are not limited to, fibrosis, breast shrinkage, and a small risk of second malignancies.     After the discussion, Ms. Stroud was given the opportunity to ask questions which were answered to her satisfaction. She was encouraged to contact us regarding any additional questions in the interim and our phone number was provided.    As Ms. Montagna is planning to return to [place], [place] for radiation treatment we discussed sending her records to a physician in the area for referral. Will touch base with Dr. Ernst Breach and send patient my chart message with information on referral.     Sarah Hamburg, DO  Resident Physician, PGY-3  Department of Radiation Oncology    I saw and evaluated this patient and have performed my own history and physical examination. I discussed the assessment and management plans for this patient with the resident. I have reviewed and edited this note to reflect my findings and recommendations.     66 y.o. female with chest wall recurrence after previous mastectomy. Reviewed recommendation for CW+RNI to decrease risk of locoregional recurrence. May be even more important as she did not tolerate AI with her first breast cancer. Favor comprehensive nodal irradiation including upper IMNs given central location of recurrence. Conventional fractionation without boost given implants in place. She will return to [facility] for treatment. Will send records when she decides on location. She has my contact information should she have further questions. Did discuss recommendation for AI after completion of radiation.     Sarah Conger, MD   Radiation Oncology

## 2021-08-19 NOTE — Unmapped (Signed)
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 She had bilateral skin sparing mastectomies, right SLNB by Dr. Melvyn Neth with immediate reconstruction with bilateral TE by Dr. Jens Som in June of 2015.     2023 bx in [place]- recurrent IDC ER/PR pos, HER2 negative.     08/02/21 right breast excision of recurrence, smartclip localized excision of clipped axillary lymph node, right axillary sentinel lymph node biopsy- negative lymph node    Hem/Onc__per tb would recommend endocrine +/- CDK4/6 inhibitors_________    Rad/Onc____8/10/23 Dr. Carmell Austria appointment________    Genetics: per patient was completed with previous breast cancer in 2015 and reports negative results

## 2021-08-19 NOTE — Unmapped (Signed)
Navigation     OSFR-07/01/21  Outside film review performed on 07/01/2021 12:37 PM EDT.   Indication: Patient underwent bilateral mastectomies in 2015 with reconstruction. She had breast imaging March 29, 2021, in [place], for abnormality seen on PET/CT. Additional breast imaging confirmed an area of probable recurrence with skin involvement, and this was found to be invasive ductal carcinoma after percutaneous biopsy. On the subsequent breast imaging, a mass in the upper outer reconstructed right breast was seen and also thought to be mildly suspicious, although a biopsy was not performed because of vascularity    Imaging provided:  *  PET/CT February 17, 2021   *  Breast MRI April 08, 2021  *  Ultrasound April 20, 2021  *  Ultrasound-guided core biopsy and postbiopsy mammogram May 19, 2021   Breast Density: The reconstructed breast tissue is predominantly fatty.   Findings/Discussion:  MRI  *  There are intact silicone implants bilaterally  *  MRI-skin enhancement and irregular enhancement extending deeper into the reconstructed right breast, axial location H 49.1.  *  Skin enhancement extends approximately 1.5 cm transversely and 1 cm sagittally, along the skin surface posterior extent is approximately 12 mm  *  Enhancement extends approximately 2 to 3 mm from the shell of the implant  *  Approximately 9 mm mass centered on axial position H 28.1 is likely a small lymph node and is borderline abnormal  No axillary lymphadenopathy or other significant findings  ULTRASOUND:  *  Both lesions were found on ultrasound  *  The mass involving the skin is seen at 1:00.   *  There is skin thickening, with abnormal enhancement extending to a irregular hypoechoic mass with a surrounding thick echogenic rim of tissue. This extends to close to the implant shell. This measures approximately 12 mm  *  The mass within the upper outer right breast is likely a lymph node and is labeled to be at 9:00 and measures approximately 7 to 8  mm in maximum dimension. It is multilobulated with borderline cortical thickness of 3 mm   Ultrasound-guided core biopsy and post biopsy mammogram  *  The mass involving the skin was biopsied. The tissue marker is adjacent to thickened skin.  *  A small mass is visible within the upper outer reconstructed right breast, which likely is the mass seen on mammogram and MRI.  *  The borderline abnormal lymph node was not biopsied   IMPRESSION:   -Ultrasound was done to reevaluate the probable lymph node in the upper outer right breast in preparation for biopsy and subsequent excision. This will be reported separately   Recommendation:  -Surgical treatment  ACR BI-RADS Category: 6 (Known Biopsy Proven Malignancy)       Right ax UL/S bx-07/02/21  Path-FINAL DIAGNOSIS:   A.   Lymph node, right axillary, ultrasound guided core needle biopsy:   - Fragments of lymph node tissue, negative for carcinoma (pancytokeratin   immunostain confirmatory).       TUMOR BOARD 07/20/21  PET/CT in Feb 2023  SURG-excision of mass  RAD-yes to [facility]  MED-AI +/- CDK    [facility] Preop-07/22/21    [facility] New Consult-07/23/21  Assessment   -Sarah Huang is a 66 y.o. female with a h/o right breast cancer s/p bilateral mastectomy with saline implant recon in 2015 presents for breast reconstruction evaluation.    Plan  -Patient is bothered by excess skin and axillary fat so will plan for liposuction  and dog ear excision. We discussed liposuction of the axilla. Risks include seroma, contour grooving, intraabdominal injury, fat necrosis, bleeding, infection, poor wound healing or scaring, residual loose skin, poor cosmetic result and the need for revisional surgery. We discussed the need to wear compression continuously for 3 moths postoperatively. Certainly its possible that after radiation intervention on the breasts might be necessary so I will have her follow-up 6 months after xrt for further evaluation. Risks, benefits and  alternatives of surgery were discussed in detail. The surgical procedure was discussed in detail. All of the patients questions were answered, and the patient demonstrates a good understanding of the proposed surgical plan. Patient wishes to proceed with surgery.      Surgery-[facility]-08/02/21  RIGHT BREAST EXCISION OF RECURRENCE, EXCISION OF  RIGHT RIGHT BREAST SKIN LESION, SMARTCLIP LOCALIZED EXCISION OF CLIPPED AXILLARY LYMPH NODE Right General  RIGHT AXILLARY SENTINEL LYMPH NODE BIOPSY   POSSIBLE COMPLETION AXILLARY DISSECTION  Path-FINAL DIAGNOSIS:   A. Lymph node, right axilla, biopsy:        - One benign lymph node negative for metastatic carcinoma (0/1)   (confirmed by IHC).        - Prior biopsy site changes are present.     B. Right breast, partial mastectomy:        - Invasive ductal carcinoma (1.3 cm), consistent with recurrence of   patient's known breast cancer.        - Tumor involves dermis; no epidermal ulceration identified.        - Surgical resection margins are negative; invasive carcinoma is 2 mm from deep margin.        - Prior biopsy site changes are present.   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 Pt picked date available to be in [place]  Initial Encounter Date:  07/01/21    Assessments  Barriers Assessment: No Barriers  Insurance: [employer] information [employer]  Financial: Not Applicable  Patient Education: Diagnosis; Procedure; Treatment Plan; Programmer, applicationsCommunity Resources; Psychological Support  Preferred Learning Method: Reading; Listening; Demonstration  Communication: MyChart Activation  Physical Concerns: Not Applicable  Patient reports difficulty with memory, learning new things, or difficulty expressing themselves:: No  Pain Level: 2  ADL: Independent  Emotional Concerns: Not Applicable    Social  Marital Status: Married  Caregiver: None  Living Arrangements: Lives with S/O  Currently Receive Home Care or Medical Equiptment in the Home?: No  POA: Information Provided  Living Will: Information Provided  Legal Barriers to Care: No  Fertility Concern: Not Applicable    Appointment Compliance  Outside Referral Coordination: Not Applicable  Transportation: Private    Plan  Internal Referrals: Medical Oncology; Radiation Oncology; Surgical  Oncology  Other Interventions: Provided Education Materials; Patient/Family Education      Acuity  Stage: 1  ECOG: 0  Modality: Multimodality (Chem, Radiation, Surgery)  Treatment Status: New Patient  Hospitalization: NA  Treatment Compliance: Compliant  Caregiver Support: Available  Acuity: 2      Future Appointments   Date Time Provider Department Center   03/10/2022  9:45 AM [facility] 3 [facility]   03/10/2022 10:40 AM Neill Loft, MD [facility] SUR3 [facility]

## 2022-03-10 ENCOUNTER — Ambulatory Visit: Payer: PRIVATE HEALTH INSURANCE

## 2022-03-10 NOTE — Progress Notes (Deleted)
Breast Surgical Oncology Follow Up Note    Patient: Sarah Huang Age: 67 y.o.  MRN: [id number] Attending: Neill Loft, MD      Date of Visit: March 08, 2022    Reason for Visit:  Follow up evaluation.    History of Present Illness:  [name] is a 67 y.o. female with a recurrent IDC ER/PR pos, HER2 negative.     She underwent right breast excision of recurrence, smart clip localized excision of clipped axillary lymph node, right axillary SLNB on 08/02/21. Patient was last seen in office 08/19/21. Since that visit patient had returned back to [place] and had completed radiation therapy. She presents today for follow up surveillance.       Interim History:  Overall, the patient states that she is doing well. She endorses {Blank single:19197::performing,not performing} self- breast/chest wall examinations. She {Blank single:19197::endorses,denies} breast pain. She denies feeling any new palpable breast masses or nodules. She denies any overlying skin changes, nipple changes or discharge.  She {Blank single:19197::is,is not} currently on {Blank multiple:19196::endocrine therapy,Arimidex,Tamoxifen,Femara}.    Oncologic History:  Oncology History   Malignant neoplasm of right female breast (CMS-HCC)   04/18/2013 Imaging    Imaging Completed:   Mammogram screening  Result:   Developing focal asymmetries bilaterally which require additional imaging     Recommendations:   Diagnostic mammogram to include spot compression CC and MLO views and full field 90 degree ML views as well as possible ultrasound.     ACR BI-RADS: 0 (Incomplete - Need additional imaging evaluation)        04/23/2013 Imaging    Imaging Completed:  Mammogram  Result:   1. New Ill-defined 6 mm mass identified on mammography in the mid upper middle third of the right breast with no ultrasound correlate.  2. Two adjacent areas of focal asymmetry and architectural distortion within the inferior lateral middle third of the  right breast with no ultrasound correlate.  3. Well-circumscribed 5 mm mass at the 12:00 position of the left breast with a suggested cystic appearance on ultrasound, too small to definitively characterize.    Recommendation:  1. Stereotactic biopsy of the irregular 6 mm right breast lesion within the 12:00 position of the right breast.  2. Stereotactic biopsy of the adjacent focal asymmetries within the inferior lateral middle third of the right breast. One biopsy is suggested with excision or followup of the second adjacent lesion performed based on biopsy results. Given these findings are subtle on mammography, if they are not adequately visualized for stereotactic biopsy, then breast MRI and/or needle localized surgical excisional biopsies would be recommended.  3. Ultrasound-guided cyst aspiration/biopsy of the small left breast lesion with post biopsy mammogram.    These findings and recommendations were discussed with the patient and given to her in written form by Dr. Ralene Ok.      BI-RADS 4 (suspicious)         05/14/2013 Initial Diagnosis    Malignant neoplasm of right female breast (CMS-HCC)     05/10/2021 Biopsy    Stereotactic core needle biopsy     05/14/2021 Pathology     A.   Right breast mass at 12 o'clock, stereotactic core biopsy:        - Invasive ductal carcinoma, modified Bloom-Richardson grade 1/3.        - See comment.     B.   Right breast mass #2, density:   - Focal ductal carcinoma in situ (3mm) low  nuclear grade, without necrosis   or microcalcifications.   - Background of atrophic breast parenchyma showing non-proliferative   fibrocystic changes and focal fat necrosis.   - See comment.     Comment:  Part A shows invasive carcinoma predominantly composed of   glandular and cribriform structures in a dense stroma.  Nuclei show mild to   moderate atypia and mitotic activity is not evident.  These features are   consistent with a well-differentiated invasive ductal carcinoma.  The   maximum  dimension of this lesion in core biopsy is 6 mm.  Tumor is present   in 2 cores and appears completely excised in this specimen, at least in 2   dimensions.  Small scattered foci of low-grade DCIS are present as well.   There is no evidence of lymph-vascular invasion.     Cytokeratin 5/6  on block B4 shows complete absence of myoepithelial cells   differentiation  in ducts with atypical intra-ductal  cellular   proliferation supporting low grade DCIS.     BREAST CANCER BIOMARKERS   The prognostic/predictive factors on this patient's breast cancer were   assessed by immunohistochemistry (IHC).     SITE:   A1     ESTROGEN RECEPTOR:    Positive   Proportion score =  5 /5.   Intensity score =  2 /3.     PROGESTERONE RECEPTOR:    Positive   Proportion score =  5 /5.   Intensity score =  3 /3.     ERBB2/HER2/neu EXPRESSION:   Negative , score =  1+ .     MIB1 PROLIFERATION RATE:   Low (score =  15 % ), associated with relatively favorable outcome.        Recurrent breast cancer, right (CMS-HCC)   06/19/2013 Initial Diagnosis    Recurrent breast cancer, right (CMS-HCC)     07/01/2021 Imaging    Imaging Completed:  Mammogram  Result:   Biopsy-proven recurrence in the right breast with skin involvement, is redemonstrated and is seen to contain the tissue marker     The abnormal low lying right axillary lymph node is also redemonstrated and because of its multiple lobulations, is thought to be borderline abnormal.     Recommendation:   Definitive treatment of the invasive ductal carcinoma, suspected to be a recurrence  Ultrasound-guided core biopsy of the borderline abnormal node probably within the low right axilla.     ADDENDUM #1  On the current study, the ultrasound abnormality for which biopsy is recommended is labeled at 11:30, 5 cm from the nipple on the current study. In [place] it had been labeled 1:00 5 cm from the nipple, but this is the same lesion        07/02/2021 Biopsy    Ultrasound guided core needle biopsy of  right axillary lymph node ordered by Dr. Farrel Gobble     07/06/2021 Pathology    A.   Lymph node, right axillary, ultrasound guided core needle biopsy:   - Fragments of lymph node tissue, negative for carcinoma (pancytokeratin   immunostain confirmatory).      08/02/2021 Pathology    Slide review   [phone], collected on 05/19/2021, 12 slides:   Right breast tissue, 1:00, butterfly clip:   -    Invasive ductal carcinoma, moderately differentiated (Nottingham grade   2).   -   See comment and breast biomarkers.     Comment: SMM and p63 immunostains both show loss of  staining around the   tumor cells.   E-Cadherin shows membranous positivity, supporting the ductal   differentiation.     Breast biomarkers for invasive carcinoma:   ER positive (proportion score 5/5, intensity score 3/3)   PR positive (proportion score 5/5, intensity score 3/3)   HER2 negative (score 0)   Controls are appropriat      08/02/2021 Surgery    Right breast partial mastectomy and biopsy of right axilla done by Dr. Ranae Palms     07/29/9468 Pathology      A. Lymph node, right axilla, biopsy:        - One benign lymph node negative for metastatic carcinoma (0/1)   (confirmed by IHC).        - Prior biopsy site changes are present.     B. Right breast, partial mastectomy:        - Invasive ductal carcinoma (1.3 cm), consistent with recurrence of   patient's known breast cancer.        - Tumor involves dermis; no epidermal ulceration identified.        - Surgical resection margins are negative; invasive carcinoma is 2 mm   from deep margin.        - Prior biopsy site changes are present.          Medications:  Current Outpatient Medications   Medication Sig Dispense Refill    ascorbic acid, vitamin C, (VITAMIN C) 500 MG tablet Take 1 tablet (500 mg total) by mouth daily.      cyanocobalamin, vitamin B-12, 1,000 mcg Subl Dissolve one tablet under tongue daily 100 tablet 3    ergocalciferol (ERGOCALCIFEROL) 1,250 mcg (50,000 unit) capsule TAKE  1 CAPSULE BY MOUTH 1 TIME A WEEK 12 capsule 3    ibuprofen (MOTRIN) 800 MG tablet Take 1 tablet (800 mg total) by mouth every 8 hours as needed. 270 tablet 0    loratadine (CLARITIN) 10 mg tablet Take 1 tablet (10 mg total) by mouth daily.      omeprazole (PRILOSEC) 40 MG capsule TAKE 1 CAPSULE(40 MG) BY MOUTH EVERY MORNING BEFORE BREAKFAST 90 capsule 1    SUMAtriptan (IMITREX) 100 MG tablet Take one tablet po daily as needed for migraine, may repeat in 2 hours if needed in 24 hours. 12 tablet 2    TURMERIC ORAL Take by mouth.      UNABLE TO FIND Take by mouth daily. Med Name: Darrold Junker off ? mg       No current facility-administered medications for this visit.       Allergies:  Allergies as of 03/10/2022 - Fully Reviewed 08/19/2021   Allergen Reaction Noted    Statins-hmg-coa reductase inhibitors  09/23/2019    Crestor [rosuvastatin] Other (See Comments) 06/17/2013       Review of Systems:  The reviewed of systems was completed and reviewed as documented by the MA on this visit date. The patient denies any new palpable breast masses, no overlying skin changes, nipple changes, nipple discharge.    Vital signs: There were no vitals filed for this visit.    Physical Examination:  GENERAL:  The patient is not in acute distress.  She is accompanied by her ***  today.  HEENT:  Normocephalic, atraumatic.  Sclerae anicteric.  Extraocular movements are intact.    NECK:  Supple.  Trachea is midline.  No cervical lymphadenopathy.  ABDOMEN:  Soft, nondistended, nontender.  Normoactive bowel sounds.  RECTAL:  Deferred.  EXTREMITIES:  No  evidence of cyanosis or edema.  NEUROLOGIC:  The patient is awake and alert and oriented x 3.  Cranial nerves are grossly intact.  BREASTS: exam in an upright and supine position reveal***    Diagnostic Imaging:  ***    Assessment:  Ms. ODETTA FORNESS is a 67 y.o. with {Blank single:19197::right,left} breast {Blank multiple:19196::multifocal,multicentric,invasive ductal  carcinoma,invasive lobular carcinoma,invasive mammary carcinoma,DCIS,LCIS,(+)*** axillary lymph node,(+) *** supraclavicular lymph node,(+) ***infraclavicular lymph node,(+) *** internal mammary lymph node}. ER ***%, PR ***%, Her2 ***, Ki67: ***% who presents today for an annual follow up evaluation. The results of her diagnostic studies that were performed were discussed in great detail with the patient. A copy of these results were given to her to keep for her own records. There is no evidence of recurrent or residual disease by imaging studies or clinical breast examination today. No further surgical oncologic intervention is required at this time.       Plan:  ***      Blenda Peals, RN

## 2022-03-10 NOTE — Progress Notes (Signed)
Navigation     Surgery-[facility]-08/02/21  RIGHT BREAST EXCISION OF RECURRENCE, EXCISION OF  RIGHT RIGHT BREAST SKIN LESION, SMARTCLIP LOCALIZED EXCISION OF CLIPPED AXILLARY LYMPH NODE Right General  RIGHT AXILLARY SENTINEL LYMPH NODE BIOPSY, Right General  POSSIBLE COMPLETION AXILLARY DISSECTION  Path-FINAL DIAGNOSIS:   A. Lymph node, right axilla, biopsy:        - One benign lymph node negative for metastatic carcinoma (0/1)   (confirmed by IHC).        - Prior biopsy site changes are present.     B. Right breast, partial mastectomy:        - Invasive ductal carcinoma (1.3 cm), consistent with recurrence of   patient's known breast cancer.        - Tumor involves dermis; no epidermal ulceration identified.        - Surgical resection margins are negative; invasive carcinoma is 2 mm   from deep margin.        - Prior biopsy site changes are present.   Post op-08/19/21 8:00am        0/1 LN+       Tumor involves dermis       Negative margins       Stage IA(pT1a, pN0 (i-)  Referral to RAD/ONC  Referral to MED/ONC-planning to follow with MED/ONC in [place]      [facility] New Consult-08/19/21  11:00am  67 y.o. female with chest wall recurrence after previous mastectomy. Reviewed recommendation for CW+RNI to decrease risk of locoregional recurrence. May be even more important as she did not tolerate AI with her first breast cancer. Favor comprehensive nodal irradiation including upper IMNs given central location of recurrence. Conventional fractionation without boost given implants in place. She will return to [facility] for treatment. Will send records when she decides on location. She has my contact information should she have further questions. Did discuss recommendation for AI after completion of radiation.       XRT-in [place]      [facility] 33mth OV + imaging-03/10/22-No Show      PLAN: Bilat Total Mastectomy-06/19/13 pT1b, pN0/Oncotype-12; No chemo/No XRT/Tamoxifen-10/04/13        -05/10/13-Right breast  IDC/Gr1/ER+ 7/8/PR+ 8/8/HER1+/Ki 67 15%            12:00      -05/10/13-Right breast DCIS/Gr1      -s/p TAH/BSO  [facility]      -Scar recurrence-06/2021  PLAN-Surgery-[facility]-08/02/21/XRT in [place]      Acuity  Treatment Status: Active Treatment  Acuity: 2    No future appointments.

## 2022-09-26 NOTE — Progress Notes (Signed)
Navigation     PLAN: Bilat Total Mastectomy-06/19/13 pT1b, pN0/Oncotype-12; No chemo/No XRT/Tamoxifen-10/04/13        -05/10/13-Right breast IDC/Gr1/ER+ 7/8/PR+ 8/8/HER1+/Ki 67 15%            12:00      -05/10/13-Right breast DCIS/Gr1      -s/p TAH/BSO  [facility]/Radhakrishnan      -Scar recurrence-06/2021  PLAN-Surgery-[facility]-08/02/21/XRT in [place]      RN-INpatient at [facility]    Self referral 05/28/21    06/23/21-[facility]-Dr. Frazier Richards  Here to get established. She works for [employer]. She is friends with Elizebeth Koller. Moved here from [place] a couple years ago.   History of breast cancer in 2015 - early stage. Bilateral mastectomy with reconstruction. Took tamoxifen for 1 year and stopped due to side effects. Did not require chemo or radiation at that time.   Had total body scan done in January - gifted her by her children.   She notes they found 10mm nodule LUL.   PET scan was done February and right base of tongue lit up, and an area of skin over her right breast scar. The pulm nodule did not light up. Saw Dr. Gabriel Rainwater, but had sinus infection at that time. That was treated and on followup with him and he examined the tongue base and he thought it looked normal.   Was referred to general surgery who ordered a needle biopsy of the area that lit up on the scar. That came back positive for recurrent cancer. There was also a nodule in the right breast that appeared on MRI, but they were unable to sample that due to vascularity. She wanted to see a breast surgeon, but since she had already seen Dr. Merril Abbe, the group at [facility] didn't feel comfortable taking over her care.   She has established with Dr. Atlee Abide. She is now planning to go back to [facility] for a surgical opinion there.  She notes 1 time vaginal bleeding the other day. denies any vaginal pain. she is s/p hysterectomy.       [facility] New Consult-07/01/21 per pt request       Possible recurrence       Has been  following in [place]    UL/S-07/01/21-  IMPRESSION:   -Biopsy-proven recurrence in the right breast with skin involvement, is redemonstrated and is seen to contain the tissue marker  -The abnormal low lying right axillary lymph node is also redemonstrated and because of its multiple lobulations, is thought to be borderline abnormal.   Recommendation:  -Definitive treatment of the invasive ductal carcinoma, suspected to be a recurrence  Ultrasound-guided core biopsy of the borderline abnormal node probably within the low right axilla.   -These findings and recommendations were discussed with the patient in person and given to the patient in written form.   -The patient was taken to the scheduler to schedule an appointment for the recommended procedure prior to leaving our department.   BI-RADS 4 (suspicious)  Although the patient does have biopsy-proven carcinoma in the right breast, this category refers to today's ultrasound of the lymph node      OSFR-07/01/21  Outside film review performed on 07/01/2021 12:37 PM EDT.   Indication: Patient underwent bilateral mastectomies in 2015 with reconstruction. She had breast imaging March 29, 2021, in [place], for abnormality seen on PET/CT. Additional breast imaging confirmed an area of probable recurrence with skin involvement, and this was found to be invasive ductal carcinoma after  percutaneous biopsy. On the subsequent breast imaging, a mass in the upper outer reconstructed right breast was seen and also thought to be mildly suspicious, although a biopsy was not performed because of vascularity    Imaging provided:  *  PET/CT February 17, 2021   *  Breast MRI April 08, 2021  *  Ultrasound April 20, 2021  *  Ultrasound-guided core biopsy and postbiopsy mammogram May 19, 2021   Breast Density: The reconstructed breast tissue is predominantly fatty.   Findings/Discussion:  MRI  *  There are intact silicone implants bilaterally  *  MRI-skin enhancement and irregular enhancement  extending deeper into the reconstructed right breast, axial location H 49.1.  *  Skin enhancement extends approximately 1.5 cm transversely and 1 cm sagittally, along the skin surface posterior extent is approximately 12 mm  *  Enhancement extends approximately 2 to 3 mm from the shell of the implant  *  Approximately 9 mm mass centered on axial position H 28.1 is likely a small lymph node and is borderline abnormal  No axillary lymphadenopathy or other significant findings  ULTRASOUND:  *  Both lesions were found on ultrasound  *  The mass involving the skin is seen at 1:00.   *  There is skin thickening, with abnormal enhancement extending to a irregular hypoechoic mass with a surrounding thick echogenic rim of tissue. This extends to close to the implant shell. This measures approximately 12 mm  *  The mass within the upper outer right breast is likely a lymph node and is labeled to be at 9:00 and measures approximately 7 to 8 mm in maximum dimension. It is multilobulated with borderline cortical thickness of 3 mm   Ultrasound-guided core biopsy and post biopsy mammogram  *  The mass involving the skin was biopsied. The tissue marker is adjacent to thickened skin.  *  A small mass is visible within the upper outer reconstructed right breast, which likely is the mass seen on mammogram and MRI.  *  The borderline abnormal lymph node was not biopsied   IMPRESSION:   -Ultrasound was done to reevaluate the probable lymph node in the upper outer right breast in preparation for biopsy and subsequent excision. This will be reported separately   Recommendation:  -Surgical treatment  ACR BI-RADS Category: 6 (Known Biopsy Proven Malignancy)       Right ax UL/S bx-07/02/21  Path-FINAL DIAGNOSIS:   A.   Lymph node, right axillary, ultrasound guided core needle biopsy:   - Fragments of lymph node tissue, negative for carcinoma (pancytokeratin   immunostain confirmatory).       TUMOR BOARD 07/20/21  PET/CT in Feb  2023  SURG-excision of mass  RAD-yes to [facility]  MED-AI +/- CDK    [facility] Preop-07/22/21    [facility] New Consult-07/23/21  Assessment   -Sarah Huang is a 67 y.o. female with a h/o right breast cancer s/p bilateral mastectomy with saline implant recon in 2015 presents for breast reconstruction evaluation.    Plan  -Patient is bothered by excess skin and axillary fat so will plan for liposuction and dog ear excision. We discussed liposuction of the axilla. Risks include seroma, contour grooving, intraabdominal injury, fat necrosis, bleeding, infection, poor wound healing or scaring, residual loose skin, poor cosmetic result and the need for revisional surgery. We discussed the need to wear compression continuously for 3 moths postoperatively. Certainly its possible that after radiation intervention on the breasts might be  necessary so I will have her follow-up 6 months after xrt for further evaluation. Risks, benefits and alternatives of surgery were discussed in detail. The surgical procedure was discussed in detail. All of the patients questions were answered, and the patient demonstrates a good understanding of the proposed surgical plan. Patient wishes to proceed with surgery.      Surgery-[facility]-08/02/21  RIGHT BREAST EXCISION OF RECURRENCE, EXCISION OF  RIGHT RIGHT BREAST SKIN LESION, SMARTCLIP LOCALIZED EXCISION OF CLIPPED AXILLARY LYMPH NODE Right General  RIGHT AXILLARY SENTINEL LYMPH NODE BIOPSY, Right General  POSSIBLE COMPLETION AXILLARY DISSECTION  Path-FINAL DIAGNOSIS:   A. Lymph node, right axilla, biopsy:        - One benign lymph node negative for metastatic carcinoma (0/1)   (confirmed by IHC).        - Prior biopsy site changes are present.     B. Right breast, partial mastectomy:        - Invasive ductal carcinoma (1.3 cm), consistent with recurrence of   patient's known breast cancer.        - Tumor involves dermis; no epidermal ulceration identified.        - Surgical  resection margins are negative; invasive carcinoma is 2 mm   from deep margin.        - Prior biopsy site changes are present.   Post op-08/19/21 8:00am        0/1 LN+       Tumor involves dermis       Negative margins       Stage IA(pT1a, pN0 (i-)  Referral to RAD/ONC  Referral to MED/ONC-planning to follow with MED/ONC in [place]      [facility] New Consult-08/19/21  11:00am  67 y.o. female with chest wall recurrence after previous mastectomy. Reviewed recommendation for CW+RNI to decrease risk of locoregional recurrence. May be even more important as she did not tolerate AI with her first breast cancer. Favor comprehensive nodal irradiation including upper IMNs given central location of recurrence. Conventional fractionation without boost given implants in place. She will return to [facility] for treatment. Will send records when she decides on location. She has my contact information should she have further questions. Did discuss recommendation for AI after completion of radiation.       XRT-in [place]      [facility] OV + imaging-03/10/22-No Show      PLAN: Bilat Total Mastectomy-06/19/13 pT1b, pN0/Oncotype-12; No chemo/No XRT/Tamoxifen-10/04/13        -05/10/13-Right breast IDC/Gr1/ER+ 7/8/PR+ 8/8/HER1+/Ki 67 15%            12:00      -05/10/13-Right breast DCIS/Gr1      -s/p TAH/BSO  [facility]rish[facility]      -Scar recurrence-06/2021  PLAN-Surgery-Excision-08/02/21/XRT in [place]      No future appointments.

## 2023-01-20 ENCOUNTER — Ambulatory Visit: Admit: 2023-01-20 | Discharge: 2023-01-27 | Payer: PRIVATE HEALTH INSURANCE

## 2023-01-20 DIAGNOSIS — Z7189 Other specified counseling: Secondary | ICD-10-CM

## 2023-01-20 NOTE — Progress Notes (Unsigned)
No chief complaint on file.       History of Present Illness  Sarah Huang is a 68 y.o. female with a h/o right breast cancer s/p bilateral mastectomy with silicone implant recon in 2015 Sarah Huang - silicone, high profile 700cc) followed by recurrence at the right breast in 2023 resulting in reexcision ([facility]elan) and RT. RT was completed in December 2023. Patient presents today with peri-implant pain. The right implant sits higher on her chest wall and is firm to the touch as compared to the L. Pain radiates into her shoulder and down her arm with certain activities. 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 Due to compensated for her R sided pain, she also has started to have significant back pain. She noticed these changes towards the end of her RT. She is also bothered by excess skin and fat at the axillae. It is difficult for her to fit into bras comfortably. She recently lost weight which contributes to this excess skin. She also would like her breasts to be overall smaller in size.   Patient lives in [place] but prefers to have her medical care primarily in [place]. She is present today with her daughter.        Review of Systems   Constitutional:  Negative for chills, fatigue and fever.   Respiratory:  Negative for cough, shortness of breath and wheezing.    Cardiovascular:  Negative for chest pain, palpitations and leg swelling.   Gastrointestinal:  Negative for constipation, diarrhea, nausea and vomiting.   Genitourinary:  Negative for frequency and urgency.   Musculoskeletal:  Positive for back pain. Negative for neck pain.   Neurological:  Negative for tremors, weakness and headaches.   Hematological:  Does not bruise/bleed easily.   Psychiatric/Behavioral:  Negative for depression. The patient is nervous/anxious.        [x]  I have reviewed the ROS and agree as documented.    Allergies  Statins-hmg-coa reductase inhibitors and Crestor [rosuvastatin]    Medications  Outpatient Encounter  Medications as of 01/20/2023   Medication Sig Dispense Refill    ascorbic acid, vitamin C, (VITAMIN C) 500 MG tablet Take 1 tablet (500 mg total) by mouth daily.      cyanocobalamin, vitamin B-12, 1,000 mcg Subl Dissolve one tablet under tongue daily 100 tablet 3    ergocalciferol (ERGOCALCIFEROL) 1,250 mcg (50,000 unit) capsule TAKE 1 CAPSULE BY MOUTH 1 TIME A WEEK 12 capsule 3    ibuprofen (MOTRIN) 800 MG tablet Take 1 tablet (800 mg total) by mouth every 8 hours as needed. 270 tablet 0    loratadine (CLARITIN) 10 mg tablet Take 1 tablet (10 mg total) by mouth daily.      omeprazole (PRILOSEC) 40 MG capsule TAKE 1 CAPSULE(40 MG) BY MOUTH EVERY MORNING BEFORE BREAKFAST 90 capsule 1    SUMAtriptan (IMITREX) 100 MG tablet Take one tablet po daily as needed for migraine, may repeat in 2 hours if needed in 24 hours. 12 tablet 2    TURMERIC ORAL Take by mouth.      UNABLE TO FIND Take by mouth daily. Med Name: Tomasa Blase off ? mg       No facility-administered encounter medications on file as of 01/20/2023.        Histories  She has a past medical history of Cancer (CMS-HCC) (06/2013), Constipation (06/17/2013), Depression, Dermatitis, Dyslipidemia, GERD (gastroesophageal reflux disease), Hyperlipidemia, Kidney stones, Psoriasis, Sleep apnea (06/17/2013), and Urinary tract infection.  She has a past surgical history that includes Tonsillectomy; Salpingoophorectomy (2004); Sinus surgery; mid urethral sling  (2002); Esophagogastroduodenoscopy (N/A, 05/29/2012); Colonoscopy (N/A, 05/29/2012); Colonoscopy (N/A, 06/27/2012); Hysterectomy (0220); remove breast tissue expanders insertion implants (Bilateral, 06/19/2013); dissection axillary (Right, 06/19/2013); Breast surgery (06/19/13); remove breast tissue expanders insertion implants (Bilateral, 11/26/2013); Breast biopsy (Right, 08/02/2021); and biopsy node sentinel (Right, 08/02/2021).    Her family history includes Breast Cancer in her paternal aunt and other family members; COPD  in her brother, father, mother, sister, and another family member; Coronary artery disease in her father and mother; Diabetes in her mother; Emphysema in her father; Heart attack in her brother; Heart disease in her brother and mother; Heart failure in her father; Hyperlipidemia in her father and mother; Hypertension in her mother; Other in her brother and sister; Psoriasis in her brother and sister.    She reports that she quit smoking about 33 years ago. Her smoking use included cigarettes. She has never used smokeless tobacco. She reports that she does not currently use alcohol. She reports that she does not use drugs.    There were no vitals taken for this visit.    Physical Exam  Constitutional:       Appearance: She is normal weight.   Pulmonary:      Effort: Pulmonary effort is normal.   Chest:   Breasts:     Breasts are asymmetrical.      Comments: Right Grade 4 capsular contracture  R breast significantly tender to examination   No capsular contracture at Left   R chest with radiation skin changes   Incisions well healed, no open areas   R IMF 1cm higher than L   No appreciable masses     Excess skin and fat hanging off of R implant/capsule  Bilateral excess skin and fat at the axilla   Abdominal:      Palpations: Abdomen is soft.   Skin:     General: Skin is warm and dry.   Neurological:      General: No focal deficit present.      Mental Status: She is alert.   Psychiatric:         Mood and Affect: Mood normal.              Assessment    Sarah Huang is a 68 y.o. female with h/o right breast cancer s/p bilateral mastectomy with implant recon in 2015 Sarah Huang - silicone, high profile 700cc) followed by recurrence at the right breast in 2023 resulting in reexcision ([facility]) and RT. Toward the end of RT (completed December 2023) patient developed R implant grade 4 capsular contracture.     Plan  We discussed that the patient has evidence of capsular contracture. The treatment of this is to undergo  partial or total scar capsule removal. The surgical removal of the scar capsule around an implant is called a capsulectomy in an otherwise healthy patient. The risk of performing a capsulectomy includes, but is not limited to, bleeding, pneumothorax and other wound complications. A capsulectomy could result in a change of shape to the breast or loss of the implant.    We also discussed the opportunity to downsize her implants bilaterally. We discussed saline vs. Silicone breast implants and advantages and disadvantages of each. The choice of size and projection of the implant will be made intraoperatively and will be guided by that patient's anatomy and desired breast size after surgery.     We discussed  the differences between saline and silicone breast implants. Advantages of saline implant include that if an implant ruptures that the saline will be reabsorbed into the tissues and the ability to customize the implant size within a defined range. Disadvantages include more likely to show rippling and in some patient's view a less natural feel. Silicone breast implants are less likely to ripple and may feel more natural, however, some patients worry about having silicone in their body. She was reminded that saline implants also use a silicone outer shell. She would like to have saline implants.    We discussed the risks associated with breast implants (silicone, saline) such as bleeding, seroma, capsular contracture, need for possible replacement, lifelong risk of infection, rippling or rupture of the implant, implant malposition, use of a foreign material, and failure of the implant to change and age with the rest of the body. We discussed the risk of breast implant associated anaplastic large cell lymphoma, which has been associated with silicone breast implants. The best estimates currently are 1:2,207-1:86,029. We discussed that as more data is collected these risks may be end up being higher or lower. In  addition, we discussed that textured implants are believed to be significantly more likely to be associated with this disease that are smoothed implants.    Symptoms such as fatigue, memory loss, rash, brain fog, and joint pain have been reported by some patients with breast implants. Some patients may use the term breast implant illness (BII) to describe these symptoms. Researchers are investigating these symptoms to better understand their origins. These symptoms and what causes them are poorly understood. In some cases, removal of the breast implants without replacement is reported to reverse symptoms of breast implant illness. At this point plastic surgeons and plastic surgery societies (ASPS, ASAPS) cannot yet define BII and therefore cannot say with any certainty that it exists, because we do not have any tests we can run to prove or disprove its existence. We did discuss options including removal of the implants, which may or may not provide relief.    Of note: we did discuss the option of implant removal without the replacement of implants as well as the use of autologous tissue such as DIEP or LD + implant. The patient was not interested in pursuing autologous reconstruction at this time.     Patient is also bothered by excess skin and axillary fat so will plan for liposuction and dog ear excision. We discussed liposuction of the axilla. Risks include seroma, contour grooving, intraabdominal injury, fat necrosis, bleeding, infection, poor wound healing or scaring, residual loose skin, poor cosmetic result and the need for revisional surgery. We discussed the need to wear compression continuously for 3 moths postoperatively.    Given the patient's history of R chest radiation, we discussed that this increases the risk profile for the above procedures however does not preclude them. Patient demonstrated her understanding.     Plan for Bilateral silicone implant exchange for saline, R capsulectomy,  Bilateral axillary liposuction and dog ear excision. Implants ordered in range 550-750cc. She would like to be smaller if possible.     Risks, benefits and alternatives of surgery were discussed in detail. The surgical procedure was discussed in detail. All of the patients questions were answered, and the patient demonstrates a good understanding of the proposed surgical plan. Patient wishes to proceed with surgery.         Medical Decision Making  The following items were considered in medical  decision making:  Review / order radiology tests    Risks & Benefits:  Risks, benefits and treatment options discussed with patient.    Time Spent   45 minutes

## 2023-02-08 NOTE — Telephone Encounter (Signed)
 Pt calling about status of PA for Sx - pt states her insurance has not received request yet. Pt request return call to advise on PA status.

## 2023-03-09 NOTE — Telephone Encounter (Signed)
 Patient called to check status of her pre-auth for surgery with Dr. Abran Cantor. I checked patient's chart. It appears that there have been several unsuccessful attempts to submit to the patient's insurance company. I have sent a message to [facility] for follow up. I explained to the patient the reason for the delay. I will reach out to her to get her scheduled once we have received a decision. Patient understood. Call complete

## 2023-03-17 NOTE — Telephone Encounter (Signed)
 Pt asking for status of PA For Sx with Dr. Abran Cantor. Request return call to advise.

## 2023-03-21 NOTE — Telephone Encounter (Signed)
 Patient returned my call. I let her know we have received approval for her 'Bilateral breast silicone implant exchange for saline implant; Right breast capsulectomy; Bilateral axillary dog ear excision'. However, they have denied her 'Bilateral axillary liposuction'    I emailed patient a pricing quote for the liposuction. Patient is aware she can proceed with or without this portion. She will need to sign and return the price quote in order to get scheduled. Payment is due, in full, prior to surgery.

## 2023-03-21 NOTE — Telephone Encounter (Signed)
 We received partial approval/denial for her surgery with Dr. Abran Cantor. LM for patient asking her to call the office at [phone] to go over this decision.

## 2023-03-24 MED ORDER — lidocaine (PF) 2% (20 mg/mL) Soln 20 mg
20 | Freq: Once | INTRAMUSCULAR | Status: AC | PRN
Start: 2023-03-24 — End: 2023-03-24

## 2023-05-25 NOTE — Progress Notes (Signed)
 05/25/23 - Patient paid $3830.00 for her 06/01/23 Bilateral Axillary Liposuction surgery with Dr. Darryll Eng. Payment was processed at the [facility] front desk by Suellen Suscheck. Patient is paid in full.

## 2023-05-29 ENCOUNTER — Ambulatory Visit: Admit: 2023-05-29 | Payer: PRIVATE HEALTH INSURANCE

## 2023-05-29 DIAGNOSIS — N651 Disproportion of reconstructed breast: Secondary | ICD-10-CM

## 2023-05-29 DIAGNOSIS — Z01818 Encounter for other preprocedural examination: Secondary | ICD-10-CM

## 2023-05-29 LAB — BASIC METABOLIC PANEL
Anion Gap: 5 mmol/L (ref 3–16)
BUN: 12 mg/dL (ref 7–25)
CO2: 27 mmol/L (ref 21–33)
Calcium: 9.4 mg/dL (ref 8.6–10.3)
Chloride: 110 mmol/L (ref 98–110)
Creatinine: 0.66 mg/dL (ref 0.60–1.30)
EGFR: >90
Glucose: 122 mg/dL — ABNORMAL HIGH (ref 70–100)
Osmolality, Calculated: 295 mosm/kg (ref 278–305)
Potassium: 4 mmol/L (ref 3.5–5.3)
Sodium: 142 mmol/L (ref 133–146)

## 2023-05-29 LAB — CBC
Hematocrit: 38 % (ref 35.0–45.0)
Hemoglobin: 12.9 g/dL (ref 11.7–15.5)
MCH: 32 pg (ref 27.0–33.0)
MCHC: 34 g/dL (ref 32.0–36.0)
MCV: 94.2 fL (ref 80.0–100.0)
MPV: 6.5 fL — ABNORMAL LOW (ref 7.5–11.5)
Platelets: 230 10*3/uL (ref 140–400)
RBC: 4.03 10*6/uL (ref 3.80–5.10)
RDW: 12 % (ref 11.0–15.0)
WBC: 6 10*3/uL (ref 3.8–10.8)

## 2023-05-29 LAB — MRSA/STAPH AUREUS DNA - PRE-SURGICAL SCREEN ONLY
MRSA, PCR: NEGATIVE
Staph Aureus, PCR: NEGATIVE

## 2023-05-29 LAB — HEPATIC FUNCTION PANEL
ALT: 12 U/L (ref 7–52)
AST: 18 U/L (ref 13–39)
Albumin: 4.4 g/dL (ref 3.5–5.7)
Alkaline Phosphatase: 50 U/L (ref 36–125)
Bilirubin, Direct: 0.1 mg/dL (ref 0.0–0.4)
Bilirubin, Indirect: 0.2 mg/dL (ref 0.0–1.1)
Total Bilirubin: 0.3 mg/dL (ref 0.0–1.5)
Total Protein: 7.3 g/dL (ref 6.4–8.9)

## 2023-05-29 MED ORDER — acetaminophen (TYLENOL) 500 MG tablet
500 | ORAL_TABLET | Freq: Four times a day (QID) | ORAL | 0 refills | 11.00000 days | Status: AC | PRN
Start: 2023-05-29 — End: ?

## 2023-05-29 NOTE — H&P (View-Only) (Signed)
 PRE-OPERATIVE HISTORY AND PHYSICAL         [facility] NP / PA: Airabella Barley RENEE Janiyha Montufar, NP      Date of Surgery: 06/01/2023  Surgeon:  Dr. Darryll Eng      Diagnosis:  breast asymmetry following reconstructive surgery; capsular contracture of breast implant  Procedure:  Bilateral breast silicone implant exchange for saline implant; Right breast capsulectomy; Bilateral axillary dog ear excision- COSMETIC - Bilateral axillary liposuction under GA at [facility]    Patient ID: Sarah Huang is a 68 y.o. female.    Chief Complaint   Patient presents with    Pre-op Exam     Breast asymmetry following reconstructive surgery     Referral Indication: pre-op H&P  History of Present Illness:  this is a 68 yo female with history of breast cancer: right, s/p (B) mastectomy and reconstruction in 2015. She had recurrence in right breast in 2023 with re excision and radiation which was complete in 12/2021. Now with asymmetry and firmness. Now to undergo aforementioned procedure. Pt's daughter is with her today.       Medical History:     Past Medical History:   Diagnosis Date    Cancer (CMS-HCC) 06/10/2013    (R) breast    Constipation 06/17/2013    IIBS    Depression     Dermatitis     Dyslipidemia     Fatty liver     GERD (gastroesophageal reflux disease)     Hyperlipidemia     Kidney stones     Psoriasis     Sleep apnea 06/17/2013    not using C-PAP    Urinary tract infection        Surgical History:     Past Surgical History:   Procedure Laterality Date    BIOPSY NODE SENTINEL Right 08/02/2021    Procedure: RIGHT AXILLARY SENTINEL LYMPH NODE BIOPSY,;  Surgeon: Rosendo Conroy, MD;  Location: [facility] OR;  Service: General;  Laterality: Right;  RIGHT AXILLARY SENTINEL LYMPH NODE BIOPSY,    BREAST BIOPSY Right 08/02/2021    Procedure: RIGHT BREAST EXCISION OF RECURRENCE, EXCISION OF  RIGHT RIGHT BREAST SKIN LESION, SMARTCLIP LOCALIZED EXCISION OF CLIPPED AXILLARY LYMPH NODE;  Surgeon: Rosendo Conroy, MD;  Location: [facility] OR;  Service: General;  Laterality: Right;   RIGHT BREAST EXCISION OF RECURRENCE, EXCISION OF  RIGHT RIGHT BREAST SKIN LESION, SMARTCLIP LOCALIZED EXCISION OF CLIPPED AXILLARY LYMPH NODE    BREAST SURGERY  06/19/13    immediate bilateral TE    COLONOSCOPY N/A 05/29/2012    Procedure: COLONOSCOPY W/ OR W/O BIOPSY;  Surgeon: Jacklyn Mash, MD;  Location: [facility];  Service: Gastroenterology;  Laterality: N/A;  limited colonoscopy.    COLONOSCOPY N/A 06/27/2012    Procedure: COLONOSCOPY W/ OR W/O BIOPSY;  Surgeon: Jacklyn Mash, MD;  Location: [facility];  Service: Gastroenterology;  Laterality: N/A;  colonoscopy    DISSECTION AXILLARY Right 06/19/2013    Procedure: -POSS RIGHT AXILLARY NODE DISSECTION;  Surgeon: Glory Larsen, MD;  Location: [facility] OR;  Service: General;  Laterality: Right;    ESOPHAGOGASTRODUODENOSCOPY N/A 05/29/2012    Procedure: EGD;  Surgeon: Jacklyn Mash, MD;  Location: [facility];  Service: Gastroenterology;  Laterality: N/A;    HYSTERECTOMY  0220    Hysty/BSO 2004-etopic pregnancy/prolapse    mid urethral sling   2002    at time of [facility]    REMOVE BREAST TISSUE EXPANDERS INSERTION IMPLANTS Bilateral 06/19/2013    Procedure: Dr. Denman Fischer at 1:30pm-placement of  bilateral tissue expanders;  Surgeon: Pecolia Bourbon, MD;  Location: [facility] OR;  Service: Plastics;  Laterality: Bilateral;    REMOVE BREAST TISSUE EXPANDERS INSERTION IMPLANTS Bilateral 11/26/2013    Procedure: EXCHANGE BILATERAL TISSUE EXPANDER FOR IMPLANT;  Surgeon: Alston Jerry, MD;  Location: [facility];  Service: Plastics;  Laterality: Bilateral;    SALPINGOOPHORECTOMY  2004    Hysty/BSO-etopic pregnancy/prolapse    SINUS SURGERY      hx sinus surg x3    TONSILLECTOMY      in the past       Family History:     Family History   Problem Relation Age of Onset    Diabetes Mother     Hypertension Mother     Coronary artery disease Mother     Heart disease Mother         Triple Bypass    COPD Mother     Hyperlipidemia Mother     Coronary artery disease Father     Hyperlipidemia  Father     COPD Father     Heart failure Father         CHF    Emphysema Father     Other Sister         Blood Disorder    COPD Sister     Psoriasis Sister     Heart attack Brother     COPD Brother     Heart disease Brother         1 brother -Triple Bypass    Other Brother         Heart Defect, Open heart surgery    Psoriasis Brother     Breast Cancer Other     COPD Other     Breast Cancer Other     Breast Cancer Paternal Aunt     Eczema Neg Hx     Melanoma Neg Hx        Allergies:   Allergies[1]    Medications:     Home Medications    Medication Sig Taking? Last Dose   cyanocobalamin , vitamin B-12, 1,000 mcg Subl Dissolve one tablet under tongue daily Yes    ergocalciferol  (ERGOCALCIFEROL ) 1,250 mcg (50,000 unit) capsule TAKE 1 CAPSULE BY MOUTH 1 TIME A WEEK Yes    ibuprofen  (MOTRIN ) 800 MG tablet Take 1 tablet (800 mg total) by mouth every 8 hours as needed. Yes    loratadine (CLARITIN) 10 mg tablet Take 1 tablet (10 mg total) by mouth daily. Yes    omeprazole  (PRILOSEC) 40 MG capsule TAKE 1 CAPSULE(40 MG) BY MOUTH EVERY MORNING BEFORE BREAKFAST Yes    ascorbic acid, vitamin C, (VITAMIN C) 500 MG tablet Take 1 tablet (500 mg total) by mouth daily.  Patient not taking: Reported on 05/29/2023     SUMAtriptan  (IMITREX ) 100 MG tablet Take one tablet po daily as needed for migraine, may repeat in 2 hours if needed in 24 hours.  Patient not taking: Reported on 05/29/2023     TURMERIC ORAL Take by mouth.  Patient not taking: Reported on 05/29/2023     UNABLE TO FIND Take by mouth daily. Med Name: Cholestro off ? mg          Review of Systems   Constitutional:  Negative for activity change, appetite change, fever, weight gain and weight loss.   HENT:  Negative for congestion and sore throat.    Respiratory:  Negative for cough and shortness of breath.  Denies orthopnea   Cardiovascular:  Negative for chest pain, palpitations and leg swelling.   Gastrointestinal:  Negative for constipation, diarrhea and vomiting.    Genitourinary:  Negative for dysuria and hematuria.   Musculoskeletal:  Positive for back pain and neck pain.   Skin:  Negative for rash and wound.   Neurological:  Negative for dizziness, seizures and syncope.   Hematological:         Denies history of DVT/ PE; denies family history of bleeding or clotting  Can bruise easily       Objective:   Blood pressure 151/66, pulse 71, temperature 97.1 F (36.2 C), temperature source Oral, height 5' 3 (1.6 m), weight 168 lb 11.2 oz (76.5 kg), SpO2 100%.    Physical Exam  Vitals reviewed.   Constitutional:       General: She is not in acute distress.     Appearance: She is well-developed. She is not diaphoretic.   HENT:      Head: Normocephalic and atraumatic.      Right Ear: External ear normal. Decreased hearing noted.      Left Ear: External ear normal. Decreased hearing noted.      Nose: Nose normal.      Mouth/Throat:      Pharynx: Uvula midline.   Eyes:      General: Lids are normal. No scleral icterus.     Conjunctiva/sclera: Conjunctivae normal.      Right eye: Right conjunctiva is not injected.      Pupils: Pupils are equal, round, and reactive to light.   Neck:      Thyroid: No thyroid mass or thyromegaly.      Vascular: No carotid bruit or JVD.      Trachea: Trachea normal. No tracheal deviation.   Cardiovascular:      Rate and Rhythm: Normal rate and regular rhythm.      Pulses:           Carotid pulses are 2+ on the right side and 2+ on the left side.       Radial pulses are 2+ on the right side and 2+ on the left side.        Dorsalis pedis pulses are 2+ on the right side and 2+ on the left side.      Heart sounds: Normal heart sounds, S1 normal and S2 normal. No murmur heard.     No friction rub. No gallop.   Pulmonary:      Effort: Pulmonary effort is normal. No respiratory distress.      Breath sounds: Normal breath sounds. No stridor. No wheezing, rhonchi or rales.   Chest:      Chest wall: No tenderness.   Abdominal:      General: Bowel sounds are  normal. There is no distension.      Palpations: Abdomen is soft. There is no mass.      Tenderness: There is no abdominal tenderness. There is no guarding or rebound.   Musculoskeletal:         General: No tenderness. Normal range of motion.      Cervical back: Normal range of motion and neck supple.      Right lower leg: No edema.      Left lower leg: No edema.   Lymphadenopathy:      Cervical: No cervical adenopathy.   Skin:     General: Skin is warm and dry.      Coloration: Skin is  not pale.      Findings: No erythema, petechiae or rash. Rash is not purpuric.      Nails: There is no clubbing.   Neurological:      Mental Status: She is alert and oriented to person, place, and time.      Cranial Nerves: No cranial nerve deficit.      Sensory: No sensory deficit.      Motor: No tremor or abnormal muscle tone.      Coordination: Coordination normal.      Gait: Gait normal.   Psychiatric:         Speech: Speech normal.         Behavior: Behavior normal.         Thought Content: Thought content normal.         Judgment: Judgment normal.         Lab Review:   BMP:       Lab name 05/29/23  1409   SODIUM 142   POTASSIUM 4.0   CHLORIDE 110   CO2 27   BUN 12     CBC:       Lab name 05/29/23  1409   WBC 6.0   HEMOGLOBIN 12.9   HEMATOCRIT 38.0   MCH 32.0   PLATELETS 230     COAGS:     RENAL:      Lab name 05/29/23  1409   GLUCOSE 122*   BUN 12   CREATININE 0.66   SODIUM 142   POTASSIUM 4.0   CHLORIDE 110   CO2 27   CALCIUM 9.4   EGFR >90     HEP:     HGBA1C:         Study Results:   NA    Anesthesia Considerations:   ASA Physical Status:  2    Metabolic equivalent of task/functional capacity: METs 4-6: stair climbing at slow pace, gardening, walking on level ground 3-3. , heavy household chores, golf/dancing, doubles tennis    Anesthetic considerations-   Pt denies endorses prolonged emergence - only 2/2 SE from narcotic pain medication   Airway exam: Mallampati II (hard and soft palate, upper portion of tonsils anduvula  visible), Thyromental distance 3 finger breadths, opening 3 finger breadths.  Denies dentures. She wears dental implants.   PIV access: acceptable per visual inspection, denies hx of difficulty with IV placement. States she has small veins    Anesthesia Airway Device 08/02/21; 1400; I.V.; Standard; Easy Mask Ventilation; jaw thrust, chin lift, 90 mm oral airway (yellow); Laryngoscope Handle; Mac; 3; Oral; Grade I View; ETT; 7 mm; Cuffed; Stylet Standard; 1; Resident; Karolee Paci, DO; Capnograph; Yes; 08/02/21; 1600            Assessment and Recommendations:   This is a 68 yo female seen in pre-op prior to Bilateral breast silicone implant exchange for saline implant; Right breast capsulectomy; Bilateral axillary dog ear excision- COSMETIC - Bilateral axillary liposuction under GA at [facility]    Concurrent medical conditions include:  1.  Cardiac Risk:  No h/o MI, CAD or CHF. She does not currently follow with cardiology.       She states that she had a stress test done in [place]  in 2021 (pt states was normal) at  [facility] - will try to obtain record and add to chart.     Reports good functional status, Mets 4-6.  Denies CP and SOB.  Denies orthopnea, palpitations, or syncope. 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  Non-smoker. Cardiac exam benign.   No further cardiac w/u indicated.    2. Breast cancer: right, s/p (B) mastectomy and reconstruction in 2015. She had recurrence in right breast in 2023 with re excision and radiation which was complete in 12/2021. Now with asymmetry and firmness.   Now to undergo aforementioned procedure. She follows with oncology in [facility] for surveillance.     3. Allergies: seasonal,  taking Claritin - continue perioperatively.     4. GERD:  The patient is controlled on Omeprazole  which she should take AM of OR.    5. HLD: states has been borderline, uses a Cholesto-OFF OTC medication. Hold for next few days in  pre-op. Could not tolerate statins    6. OSA: non compliant with CPAP- could not tolerate- states she lost weight and no longer snores    7. Hearing loss: wears (B) hearing aids    8. NAFLD: LFTs sent today    I have conveyed my assessment and recommendations to the referring provider via shared EMR.  Preoperative instructions reviewed with patient. Patient verbalizes understanding.  Labs obtained today: CBC, BMP, MRSA, LFTs    Chistina Roston CNP    Number and Complexity of Problems Addressed  2 or more stable chronic illnesses  1 acute, uncomplicated illness or injury    Amount and/or Complexity of Data to be Reviewed and Analyzed  3+ review of prior external notes from each unique source  3+ review of the results of each unique test  2 unique tests ordered    Risk of Complications and/or Morbidity or Mortality of Patient Management  Moderate    Time  I spent a total of 40 minutes on the day of the visit.           [1]   Allergies  Allergen Reactions    Statins-Hmg-Coa Reductase Inhibitors      Other reaction(s): Other reaction  severe leg cramps    Crestor  [Rosuvastatin ] Other (See Comments)     Causes tightening in leg muscles.

## 2023-05-29 NOTE — Patient Instructions (Addendum)
 [facility]:  [address]  [place], [place] [address]   [phone] (phone); [phone] (fax)    Arrival Instructions    We're pleased that you have chosen [facility] for your upcoming procedure.  The staff serving you has been professionally trained to provide the highest quality care.  We encourage you to ask questions and to let the staff know of any special needs,as we want your visit to be as comfortable as possible.    Your procedure, as of today, is scheduled on 06/01/2023 at  7:30 am     Please arrive at time directed by surgeon's office  You will check in at: [facility]: registration desk in the main lobby    Please be aware that your surgeon's office will reach out to you regarding any changes to your date and time of surgery. Feel free to contact your surgeon's office 1-2 days prior to surgery to confirm.     Parking:  [facility]: Main hospital parking lot.  * Valet Parking available from 6:00am-6:30pm for a fee.      Fasting Instructions Prior to Surgery or Procedure Requiring Sedation    DO NOT EAT OR DRINK ANYTHING after midnight the night before your procedure. , FAILURE TO FOLLOW YOUR DIET INSTRUCTIONS MAY LEAD TO A DELAY OR CANCELLATION OF YOUR SURGERY.       Medication Instructions  Bring a list of your current medications, along with the dose and frequency. Please include any vitamins, herbal supplements, and over the counter medications you may be taking.  Your pre op nurse will need to know when your last dose of medication was taken.      FOLLOW THE INSTRUCTIONS BELOW REGARDING YOUR MEDICATIONS. If instructed to TAKE the medication on the morning of surgery, do so with a small sip of water .    Current Medications   Medication Instructions    loratadine (CLARITIN) 10 mg tablet Take morning of surgery    omeprazole  (PRILOSEC) 40 MG capsule Take morning of surgery                Over-the-Counter Medication (OTC)  Instructions:  ? Stop taking  over-the-counter blood thinners seven (7) to ten (10) days prior to your surgery.  Examples of over the counter blood thinners include:  Vitamin E and NSAIDs (Motrin , Naproxen , Aleve , Advil , Ibuprofen , Diclofenac, and Meloxicam).    ? Stop taking Herbal Supplements and multivitamins for two (2) weeks prior to surgery.  ? Stop Chondroitin and glucosamine for two (2) days prior to surgery.  ? Stop using Cannabis products two (2) weeks prior to surgery, unless you are taking cannabis to treat seizures or for medicinal purpose, then please stop on the day of surgery.  ? Nicotine patches should be removed 24 hours in advance of the surgery.    ? It is OK to continue taking: Fish oil , omege-3 fatty acids, melatonin, and supplements used to treat a specific deficiency (vitamin B12, iron, folate, calcium, magnesium or potassium.)  ? It is OK to take acetaminophen  (Tylenol ) if needed in the days leading up to surgery.     General Pre-Procedure Instructions  You will need a responsible adult to accompany you home from the hospital.  You will not be permitted to drive, take a taxi, bus, [place] or Lyft by yourself, as these do not count as reliable transportation. You will also need someone to stay with you for the first 24 hours after anesthesia.  We will ask you to provide the name, and a working phone number for your designated contact.  Please bring a photo ID and insurance information. If you are being admitted to the hospital after surgery, you may bring a small overnight bag, but please do not bring any valuables.  If you have an IMPLANTED DEVICE, BRING THE REMOTE CONTROL for it.  Examples include:  bladder stimulators, pain stimulators, INSPIRE, etc.  If you have obstructive sleep apnea (OSA) and use a CPAP or BiPAP device, please bring this with you.  Pre-menopausal female patients will be screened and/or tested for pregnancy prior to your procedure given the potential risk of anesthesia and surgery to a fetus.  Please  SHOWER the evening before surgery and again the morning of surgery using antibacterial soap (Dial).   Do not shave in the area of the surgery for 2 days prior to surgery.  If needed, a trained staff member will clip the area immediately before your surgery.        Additional Instructions  CONTACT INFORMATION:    For questions regarding your pre operative instructions, please call: [facility] for Perioperative Care: [phone]  There is a nurse available Monday through Friday, 8 a.m. to 4:30 p.m.  The office is closed on weekends and holidays.    After hours you may leave a voicemail, and someone will return your call on the next business day.    In an EMERGENCY, if you must reach someone after our office is closed, you may call same day surgery at [phone] [facility]) or [phone] [facility]) from 5:30 to 8 p.m.      SICK POLICY:    If you suspect that you have an illness/infection/rash currently, or, if you develop an illness or rash prior to surgery, please contact your surgeon immediately.  Any patient can request that we wear a mask, and we ask that you please respect that request.  After 8 p.m., urgent calls only may go to the operating room desk at [phone].    VISITOR POLICY:    Same day surgery is allowing no more than (two) 2 adult visitors per patient at any time.    Prior to having any visitors, the patient will be checked in by the same day surgery nurse.   Children under the age of 78 are not permitted to visit.    Visiting hours in areas of the hospital will vary depending on specific unit, or floor of the hospital.       Day of Surgery Checklist:    The morning of your surgery:  ? Brush your teeth.   ? Shower: the morning of surgery with an antibacterial soap, such as Dial. If given Chlorhexidine (CHG) wash or mupirocin/Bactroban nasal ointment, please use as instructed.   ? Do not use: powder, lotions, deodorant, perfume, and or  cologne.   ? What to wear: Wear casual, loose fitting, and comfortable clothing.  ? Do NOT wear: Any make-up, jewelry, watch, body piercings, powders, perfumes/colognes, dark nail polish.  ? take medications listed above  ? DO NOT shave in the area of surgery    Bring with you:  ? Bring a list of your medications and dose including herbal and over-the-counter medications.   ? Photo ID  ? Insurance card   ? Glasses and case (do not wear contact lenses on the day of your surgery)  ? Dentures and case   ? Hearing aids  and case  ? Crutches, walker, or cane if you have these items and will need them after surgery.  ? Specific medications as described above (rescue inhaler, transplant, seizure, Parkinson's and transplant medications)  ? CPAP or BiPAP machine and all associated equipment except water   ? Remote control for any implanted device (nerve stimulator, bladder stimulator, INSPIRE, etc)  ? Other:   ? For an overnight stay - pack a small bag of items that you may need (ex. change of clothing, toiletries, phone charger).  Locker space is limited in the surgery area.    Leave at home:  ? Valuables:  We recommend that you leave valuables (ex. money, jewelry, credit cards) at home or with your family.   ? Contact lenses: Leave at home or bring a case for safe keeping.  ? Any make-up, jewelry, body piercings, powders, perfumes/colognes, dark nail polish  ? Please do not bring valuables such as money, jewelry or credit cards with you.

## 2023-05-29 NOTE — Other (Addendum)
 Spoke to matt in lab to add LFTs  Fax request sent for stress test done 2021 at [facility]   fax [phone]  Phone [phone]

## 2023-05-29 NOTE — Other (Signed)
 MRSA SWAB NEG

## 2023-05-29 NOTE — H&P (Signed)
 PRE-OPERATIVE HISTORY AND PHYSICAL         [facility] NP / PA: Wasil Wolke RENEE Arafat Cocuzza, NP      Date of Surgery: 06/01/2023  Surgeon:  Dr. Darryll Eng      Diagnosis:  breast asymmetry following reconstructive surgery; capsular contracture of breast implant  Procedure:  Bilateral breast silicone implant exchange for saline implant; Right breast capsulectomy; Bilateral axillary dog ear excision- COSMETIC - Bilateral axillary liposuction under GA at [facility]    Patient ID: Sarah Huang is a 68 y.o. female.    Chief Complaint   Patient presents with    Pre-op Exam     Breast asymmetry following reconstructive surgery     Referral Indication: pre-op H&P  History of Present Illness:  this is a 68 yo female with history of breast cancer: right, s/p (B) mastectomy and reconstruction in 2015. She had recurrence in right breast in 2023 with re excision and radiation which was complete in 12/2021. Now with asymmetry and firmness. Now to undergo aforementioned procedure. Pt's daughter is with her today.       Medical History:     Past Medical History:   Diagnosis Date    Cancer (CMS-HCC) 06/10/2013    (R) breast    Constipation 06/17/2013    IIBS    Depression     Dermatitis     Dyslipidemia     Fatty liver     GERD (gastroesophageal reflux disease)     Hyperlipidemia     Kidney stones     Psoriasis     Sleep apnea 06/17/2013    not using C-PAP    Urinary tract infection        Surgical History:     Past Surgical History:   Procedure Laterality Date    BIOPSY NODE SENTINEL Right 08/02/2021    Procedure: RIGHT AXILLARY SENTINEL LYMPH NODE BIOPSY,;  Surgeon: Rosendo Conroy, MD;  Location: [facility] OR;  Service: General;  Laterality: Right;  RIGHT AXILLARY SENTINEL LYMPH NODE BIOPSY,    BREAST BIOPSY Right 08/02/2021    Procedure: RIGHT BREAST EXCISION OF RECURRENCE, EXCISION OF  RIGHT RIGHT BREAST SKIN LESION, SMARTCLIP LOCALIZED EXCISION OF CLIPPED AXILLARY LYMPH NODE;  Surgeon: Rosendo Conroy, MD;  Location: [facility] OR;  Service: General;  Laterality: Right;   RIGHT BREAST EXCISION OF RECURRENCE, EXCISION OF  RIGHT RIGHT BREAST SKIN LESION, SMARTCLIP LOCALIZED EXCISION OF CLIPPED AXILLARY LYMPH NODE    BREAST SURGERY  06/19/13    immediate bilateral TE    COLONOSCOPY N/A 05/29/2012    Procedure: COLONOSCOPY W/ OR W/O BIOPSY;  Surgeon: Jacklyn Mash, MD;  Location: [facility] END[facility];  Service: Gastroenterology;  Laterality: N/A;  limited colonoscopy.    COLONOSCOPY N/A 06/27/2012    Procedure: COLONOSCOPY W/ OR W/O BIOPSY;  Surgeon: Jacklyn Mash, MD;  Location: [facility];  Service: Gastroenterology;  Laterality: N/A;  colonoscopy    DISSECTION AXILLARY Right 06/19/2013    Procedure: -POSS RIGHT AXILLARY NODE DISSECTION;  Surgeon: Glory Larsen, MD;  Location: [facility] OR;  Service: General;  Laterality: Right;    ESOPHAGOGASTRODUODENOSCOPY N/A 05/29/2012    Procedure: EGD;  Surgeon: Jacklyn Mash, MD;  Location: [facility];  Service: Gastroenterology;  Laterality: N/A;    HYSTERECTOMY  0220    Hysty/BSO 2004-etopic pregnancy/prolapse    mid urethral sling   2002    at time of [facility]    REMOVE BREAST TISSUE EXPANDERS INSERTION IMPLANTS Bilateral 06/19/2013    Procedure: Dr. Denman Fischer at 1:30pm-placement of  bilateral tissue expanders;  Surgeon: Pecolia Bourbon, MD;  Location: [facility] OR;  Service: Plastics;  Laterality: Bilateral;    REMOVE BREAST TISSUE EXPANDERS INSERTION IMPLANTS Bilateral 11/26/2013    Procedure: EXCHANGE BILATERAL TISSUE EXPANDER FOR IMPLANT;  Surgeon: Alston Jerry, MD;  Location: [facility];  Service: Plastics;  Laterality: Bilateral;    SALPINGOOPHORECTOMY  2004    Hysty/BSO-etopic pregnancy/prolapse    SINUS SURGERY      hx sinus surg x3    TONSILLECTOMY      in the past       Family History:     Family History   Problem Relation Age of Onset    Diabetes Mother     Hypertension Mother     Coronary artery disease Mother     Heart disease Mother         Triple Bypass    COPD Mother     Hyperlipidemia Mother     Coronary artery disease Father     Hyperlipidemia  Father     COPD Father     Heart failure Father         CHF    Emphysema Father     Other Sarah         Blood Disorder    COPD Sarah     Psoriasis Sarah     Heart attack Brother     COPD Brother     Heart disease Brother         1 brother -Triple Bypass    Other Brother         Heart Defect, Open heart surgery    Psoriasis Brother     Breast Cancer Other     COPD Other     Breast Cancer Other     Breast Cancer Paternal Aunt     Eczema Neg Hx     Melanoma Neg Hx        Allergies:   Allergies[1]    Medications:     Home Medications    Medication Sig Taking? Last Dose   cyanocobalamin , vitamin B-12, 1,000 mcg Subl Dissolve one tablet under tongue daily Yes    ergocalciferol  (ERGOCALCIFEROL ) 1,250 mcg (50,000 unit) capsule TAKE 1 CAPSULE BY MOUTH 1 TIME A WEEK Yes    ibuprofen  (MOTRIN ) 800 MG tablet Take 1 tablet (800 mg total) by mouth every 8 hours as needed. Yes    loratadine (CLARITIN) 10 mg tablet Take 1 tablet (10 mg total) by mouth daily. Yes    omeprazole  (PRILOSEC) 40 MG capsule TAKE 1 CAPSULE(40 MG) BY MOUTH EVERY MORNING BEFORE BREAKFAST Yes    ascorbic acid, vitamin C, (VITAMIN C) 500 MG tablet Take 1 tablet (500 mg total) by mouth daily.  Patient not taking: Reported on 05/29/2023     SUMAtriptan  (IMITREX ) 100 MG tablet Take one tablet po daily as needed for migraine, may repeat in 2 hours if needed in 24 hours.  Patient not taking: Reported on 05/29/2023     TURMERIC ORAL Take by mouth.  Patient not taking: Reported on 05/29/2023     UNABLE TO FIND Take by mouth daily. Med Name: Cholestro off ? mg          Review of Systems   Constitutional:  Negative for activity change, appetite change, fever, weight gain and weight loss.   HENT:  Negative for congestion and sore throat.    Respiratory:  Negative for cough and shortness of breath.  Denies orthopnea   Cardiovascular:  Negative for chest pain, palpitations and leg swelling.   Gastrointestinal:  Negative for constipation, diarrhea and vomiting.    Genitourinary:  Negative for dysuria and hematuria.   Musculoskeletal:  Positive for back pain and neck pain.   Skin:  Negative for rash and wound.   Neurological:  Negative for dizziness, seizures and syncope.   Hematological:         Denies history of DVT/ PE; denies family history of bleeding or clotting  Can bruise easily       Objective:   Blood pressure 151/66, pulse 71, temperature 97.1 F (36.2 C), temperature source Oral, height 5' 3 (1.6 m), weight 168 lb 11.2 oz (76.5 kg), SpO2 100%.    Physical Exam  Vitals reviewed.   Constitutional:       General: She is not in acute distress.     Appearance: She is well-developed. She is not diaphoretic.   HENT:      Head: Normocephalic and atraumatic.      Right Ear: External ear normal. Decreased hearing noted.      Left Ear: External ear normal. Decreased hearing noted.      Nose: Nose normal.      Mouth/Throat:      Pharynx: Uvula midline.   Eyes:      General: Lids are normal. No scleral icterus.     Conjunctiva/sclera: Conjunctivae normal.      Right eye: Right conjunctiva is not injected.      Pupils: Pupils are equal, round, and reactive to light.   Neck:      Thyroid: No thyroid mass or thyromegaly.      Vascular: No carotid bruit or JVD.      Trachea: Trachea normal. No tracheal deviation.   Cardiovascular:      Rate and Rhythm: Normal rate and regular rhythm.      Pulses:           Carotid pulses are 2+ on the right side and 2+ on the left side.       Radial pulses are 2+ on the right side and 2+ on the left side.        Dorsalis pedis pulses are 2+ on the right side and 2+ on the left side.      Heart sounds: Normal heart sounds, S1 normal and S2 normal. No murmur heard.     No friction rub. No gallop.   Pulmonary:      Effort: Pulmonary effort is normal. No respiratory distress.      Breath sounds: Normal breath sounds. No stridor. No wheezing, rhonchi or rales.   Chest:      Chest wall: No tenderness.   Abdominal:      General: Bowel sounds are  normal. There is no distension.      Palpations: Abdomen is soft. There is no mass.      Tenderness: There is no abdominal tenderness. There is no guarding or rebound.   Musculoskeletal:         General: No tenderness. Normal range of motion.      Cervical back: Normal range of motion and neck supple.      Right lower leg: No edema.      Left lower leg: No edema.   Lymphadenopathy:      Cervical: No cervical adenopathy.   Skin:     General: Skin is warm and dry.      Coloration: Skin is  not pale.      Findings: No erythema, petechiae or rash. Rash is not purpuric.      Nails: There is no clubbing.   Neurological:      Mental Status: She is alert and oriented to person, place, and time.      Cranial Nerves: No cranial nerve deficit.      Sensory: No sensory deficit.      Motor: No tremor or abnormal muscle tone.      Coordination: Coordination normal.      Gait: Gait normal.   Psychiatric:         Speech: Speech normal.         Behavior: Behavior normal.         Thought Content: Thought content normal.         Judgment: Judgment normal.         Lab Review:   BMP:       Lab name 05/29/23  1409   SODIUM 142   POTASSIUM 4.0   CHLORIDE 110   CO2 27   BUN 12     CBC:       Lab name 05/29/23  1409   WBC 6.0   HEMOGLOBIN 12.9   HEMATOCRIT 38.0   MCH 32.0   PLATELETS 230     COAGS:     RENAL:      Lab name 05/29/23  1409   GLUCOSE 122*   BUN 12   CREATININE 0.66   SODIUM 142   POTASSIUM 4.0   CHLORIDE 110   CO2 27   CALCIUM 9.4   EGFR >90     HEP:     HGBA1C:         Study Results:   NA    Anesthesia Considerations:   ASA Physical Status:  2    Metabolic equivalent of task/functional capacity: METs 4-6: stair climbing at slow pace, gardening, walking on level ground 3-3. , heavy household chores, golf/dancing, doubles tennis    Anesthetic considerations-   Pt denies endorses prolonged emergence - only 2/2 SE from narcotic pain medication   Airway exam: Mallampati II (hard and soft palate, upper portion of tonsils anduvula  visible), Thyromental distance 3 finger breadths, opening 3 finger breadths.  Denies dentures. She wears dental implants.   PIV access: acceptable per visual inspection, denies hx of difficulty with IV placement. States she has small veins    Anesthesia Airway Device 08/02/21; 1400; I.V.; Standard; Easy Mask Ventilation; jaw thrust, chin lift, 90 mm oral airway (yellow); Laryngoscope Handle; Mac; 3; Oral; Grade I View; ETT; 7 mm; Cuffed; Stylet Standard; 1; Resident; Karolee Paci, DO; Capnograph; Yes; 08/02/21; 1600            Assessment and Recommendations:   This is a 68 yo female seen in pre-op prior to Bilateral breast silicone implant exchange for saline implant; Right breast capsulectomy; Bilateral axillary dog ear excision- COSMETIC - Bilateral axillary liposuction under GA at [facility]    Concurrent medical conditions include:  1.  Cardiac Risk:  No h/o MI, CAD or CHF. She does not currently follow with cardiology.       She states that she had a stress test done in [place]  in 2021 (pt states was normal) at  [facility] - will try to obtain record and add to chart.     Reports good functional status, Mets 4-6.  Denies CP and SOB.  Denies orthopnea, palpitations, or syncope. 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  Non-smoker. Cardiac exam benign.   No further cardiac w/u indicated.    2. Breast cancer: right, s/p (B) mastectomy and reconstruction in 2015. She had recurrence in right breast in 2023 with re excision and radiation which was complete in 12/2021. Now with asymmetry and firmness.   Now to undergo aforementioned procedure. She follows with oncology in [facility] for surveillance.     3. Allergies: seasonal,  taking Claritin - continue perioperatively.     4. GERD:  The patient is controlled on Omeprazole  which she should take AM of OR.    5. HLD: states has been borderline, uses a Cholesto-OFF OTC medication. Hold for next few days in  pre-op. Could not tolerate statins    6. OSA: non compliant with CPAP- could not tolerate- states she lost weight and no longer snores    7. Hearing loss: wears (B) hearing aids    8. NAFLD: LFTs sent today    I have conveyed my assessment and recommendations to the referring provider via shared EMR.  Preoperative instructions reviewed with patient. Patient verbalizes understanding.  Labs obtained today: CBC, BMP, MRSA, LFTs    Judee Hennick CNP    Number and Complexity of Problems Addressed  2 or more stable chronic illnesses  1 acute, uncomplicated illness or injury    Amount and/or Complexity of Data to be Reviewed and Analyzed  3+ review of prior external notes from each unique source  3+ review of the results of each unique test  2 unique tests ordered    Risk of Complications and/or Morbidity or Mortality of Patient Management  Moderate    Time  I spent a total of 40 minutes on the day of the visit.           [1]   Allergies  Allergen Reactions    Statins-Hmg-Coa Reductase Inhibitors      Other reaction(s): Other reaction  severe leg cramps    Crestor  [Rosuvastatin ] Other (See Comments)     Causes tightening in leg muscles.

## 2023-05-30 MED ORDER — acetaminophen-codeine (TYLENOL #3) 300-30 mg per tablet
300-30 | ORAL_TABLET | Freq: Three times a day (TID) | ORAL | 0 refills | 8.00000 days | Status: AC
Start: 2023-05-30 — End: 2023-06-07

## 2023-05-31 MED FILL — LIDOCAINE HCL 10 MG/ML (1 %) INJECTION SOLUTION: 10 10 mg/mL (1 %) | INTRAMUSCULAR | Qty: 20 | Fill #0

## 2023-06-01 MED ORDER — ondansetron (ZOFRAN) 4 mg/2 mL injection
4 | INTRAMUSCULAR | Status: AC
Start: 2023-06-01 — End: ?

## 2023-06-01 MED ORDER — ketorolac (TORADOL) 30 mg/mL (1 mL) injection
30 | INTRAMUSCULAR | Status: AC
Start: 2023-06-01 — End: ?

## 2023-06-01 MED ORDER — sugammadex (BRIDION) IV solution
100 | INTRAVENOUS | PRN
Start: 2023-06-01 — End: 2023-06-01
  Administered 2023-06-01: 14:00:00 via INTRAVENOUS

## 2023-06-01 MED ORDER — ketorolac (TORADOL) injection 30 mg
30 | INTRAMUSCULAR | PRN
Start: 2023-06-01 — End: 2023-06-01
  Administered 2023-06-01: 14:00:00 via INTRAVENOUS

## 2023-06-01 MED ORDER — naloxone (NARCAN) injection 0.04 mg
0.4 | INTRAMUSCULAR | PRN
Start: 2023-06-01 — End: 2023-06-01

## 2023-06-01 MED ORDER — lidocaine (PF) 2% (20 mg/mL) 20 mg/mL (2 %) Soln
20 | INTRAMUSCULAR | Status: AC
Start: 2023-06-01 — End: ?

## 2023-06-01 MED ORDER — propofol (DIPRIVAN) infusion 10 mg/mL ADS Med
10 | INTRAVENOUS | Status: AC
Start: 2023-06-01 — End: ?

## 2023-06-01 MED ORDER — fentaNYL (SUBLIMAZE) 50 mcg/mL injection
50 | INTRAMUSCULAR | Status: AC
Start: 2023-06-01 — End: ?

## 2023-06-01 MED ORDER — rocuronium (ZEMURON) 10 mg/mL injection
10 | INTRAVENOUS | Status: AC
Start: 2023-06-01 — End: ?

## 2023-06-01 MED ORDER — ondansetron (ZOFRAN-ODT) 4 MG disintegrating tablet
4 | ORAL_TABLET | Freq: Three times a day (TID) | ORAL | 0 refills | 7.00000 days | Status: AC | PRN
Start: 2023-06-01 — End: 2023-06-04

## 2023-06-01 MED ORDER — tranexamic acid in NaCl,iso-os IV piggyback
1000 | INTRAVENOUS | Status: AC | PRN
Start: 2023-06-01 — End: 2023-06-01
  Administered 2023-06-01: 13:00:00 via INTRAVENOUS

## 2023-06-01 MED ORDER — ceFAZolin (ANCEF) 2 g in sodium chloride 0.9 % 100 mL VIAL2BAG
INTRAVENOUS | Status: AC | PRN
Start: 2023-06-01 — End: 2023-06-01
  Administered 2023-06-01: 12:00:00 via INTRAVENOUS

## 2023-06-01 MED ORDER — sodium chloride 0.9 % irrigation
0.9 | PRN
Start: 2023-06-01 — End: 2023-06-01
  Administered 2023-06-01: 14:00:00

## 2023-06-01 MED ORDER — senna-docusate (SENNA-S) 8.6-50 mg per tablet
8.6-50 | ORAL_TABLET | Freq: Every evening | ORAL | 0 refills | 30.00000 days | Status: AC | PRN
Start: 2023-06-01 — End: 2023-06-15

## 2023-06-01 MED ORDER — TUMESCENT - epiNEPHRine 5 mg/lidocaine 1% 200 mg in Sodium Chloride 0.9% 1000 mL
Freq: Once | INTRAVENOUS | Status: AC
Start: 2023-06-01 — End: 2023-06-01
  Administered 2023-06-01: 13:00:00

## 2023-06-01 MED ORDER — dextrose 10%-water (D10W) IV soln
INTRAVENOUS | PRN
Start: 2023-06-01 — End: 2023-06-01

## 2023-06-01 MED ORDER — sodium chloride 0.9 % irrigation
0.9 | PRN
Start: 2023-06-01 — End: 2023-06-01
  Administered 2023-06-01: 14:00:00 via TOPICAL

## 2023-06-01 MED ORDER — propofol 10 mg/ml (DIPRIVAN) injection
10 | INTRAVENOUS | PRN
Start: 2023-06-01 — End: 2023-06-01
  Administered 2023-06-01 (×4): via INTRAVENOUS

## 2023-06-01 MED ORDER — tranexamic acid in NaCl,iso-os 1,000 mg/100 mL (10 mg/mL) IV piggyback
1000 | INTRAVENOUS
Start: 2023-06-01 — End: 2023-06-01

## 2023-06-01 MED ORDER — fentaNYL (SUBLIMAZE) injection 12.5 mcg
50 | INTRAMUSCULAR | PRN
Start: 2023-06-01 — End: 2023-06-01

## 2023-06-01 MED ORDER — ondansetron (ZOFRAN) injection
4 | INTRAMUSCULAR | PRN
Start: 2023-06-01 — End: 2023-06-01
  Administered 2023-06-01: 14:00:00 via INTRAVENOUS

## 2023-06-01 MED ORDER — ondansetron (ZOFRAN) injection 4 mg
4 | Freq: Once | INTRAMUSCULAR | PRN
Start: 2023-06-01 — End: 2023-06-01

## 2023-06-01 MED ORDER — fentaNYL (SUBLIMAZE) injection 25 mcg
50 | INTRAMUSCULAR | PRN
Start: 2023-06-01 — End: 2023-06-01
  Administered 2023-06-01 (×2): via INTRAVENOUS

## 2023-06-01 MED ORDER — oxyCODONE (ROXICODONE) immediate release tablet 5 mg
5 | ORAL | PRN
Start: 2023-06-01 — End: 2023-06-01
  Administered 2023-06-01: 15:00:00 via ORAL

## 2023-06-01 MED ORDER — povidone-iodine (BETADINE) 10 % external solution
10 | TOPICAL | PRN
Start: 2023-06-01 — End: 2023-06-01
  Administered 2023-06-01: 14:00:00 via TOPICAL

## 2023-06-01 MED ORDER — proCHLORPERazine (COMPAZINE) injection 5 mg
10 | Freq: Once | INTRAMUSCULAR | PRN
Start: 2023-06-01 — End: 2023-06-01

## 2023-06-01 MED ORDER — methocarbamoL (ROBAXIN) injection
100 | INTRAMUSCULAR | PRN
Start: 2023-06-01 — End: 2023-06-01
  Administered 2023-06-01: 13:00:00 via INTRAVENOUS

## 2023-06-01 MED ORDER — lactated Ringers IV infusion
INTRAVENOUS
Start: 2023-06-01 — End: 2023-06-01
  Administered 2023-06-01 (×2): via INTRAVENOUS

## 2023-06-01 MED ORDER — acetaminophen (TYLENOL) tablet 975 mg
325 | ORAL | Status: AC | PRN
Start: 2023-06-01 — End: 2023-06-01
  Administered 2023-06-01: 11:00:00 via ORAL

## 2023-06-01 MED ORDER — ceFAZolin (ANCEF) 1 g injection
1 | INTRAMUSCULAR
Start: 2023-06-01 — End: 2023-06-01

## 2023-06-01 MED ORDER — diphenhydrAMINE (BENADRYL) injection 6 mg
50 | INTRAMUSCULAR | PRN
Start: 2023-06-01 — End: 2023-06-01

## 2023-06-01 MED ORDER — ceFAZolin (ANCEF) 2 g in sodium chloride 0.9 % 100 mL VIAL2BAG
INTRAVENOUS | Status: AC | PRN
Start: 2023-06-01 — End: 2023-06-01
  Administered 2023-06-01: 13:00:00 via INTRAVENOUS

## 2023-06-01 MED ORDER — dexamethasone (DECADRON) 4 mg/mL injection
4 | INTRAMUSCULAR | Status: AC
Start: 2023-06-01 — End: ?

## 2023-06-01 MED ORDER — methocarbamoL (ROBAXIN) 100 mg/mL injection
100 | INTRAMUSCULAR | Status: AC
Start: 2023-06-01 — End: ?

## 2023-06-01 MED ORDER — lidocaine (PF) 20 mg/mL (2 %) Soln
20 | INTRAVENOUS | PRN
Start: 2023-06-01 — End: 2023-06-01
  Administered 2023-06-01: 12:00:00 via INTRAVENOUS

## 2023-06-01 MED ORDER — gentamicin 160 mg in sodium chloride 0.9 % 1,000 mL IRRIGATION
40 | PRN
Start: 2023-06-01 — End: 2023-06-01
  Administered 2023-06-01: 13:00:00

## 2023-06-01 MED ORDER — dexamethasone (DECADRON) injection
4 | INTRAMUSCULAR | PRN
Start: 2023-06-01 — End: 2023-06-01
  Administered 2023-06-01: 12:00:00 via INTRAVENOUS

## 2023-06-01 MED ORDER — peppermint oiL liquid 1 mL
PRN
Start: 2023-06-01 — End: 2023-06-01

## 2023-06-01 MED ORDER — gentamicin (GARAMYCIN) 40 mg/mL injection
40 | INTRAMUSCULAR
Start: 2023-06-01 — End: 2023-06-01

## 2023-06-01 MED ORDER — rocuronium (ZEMURON) injection
10 | INTRAVENOUS | PRN
Start: 2023-06-01 — End: 2023-06-01
  Administered 2023-06-01 (×3): via INTRAVENOUS

## 2023-06-01 MED ORDER — sugammadex (BRIDION) 100 mg/mL IV solution
100 | INTRAVENOUS | Status: AC
Start: 2023-06-01 — End: ?

## 2023-06-01 MED ORDER — sodium chloride 0.9 % IV infusion
INTRAVENOUS
Start: 2023-06-01 — End: 2023-06-01

## 2023-06-01 MED ORDER — HYDROcodone-acetaminophen (NORCO) 5-325 mg per tablet
5-325 | ORAL_TABLET | Freq: Four times a day (QID) | ORAL | 0 refills | 15.50000 days | Status: AC | PRN
Start: 2023-06-01 — End: 2023-06-08

## 2023-06-01 MED ORDER — oxyCODONE (ROXICODONE) immediate release tablet 2.5 mg
5 | ORAL | PRN
Start: 2023-06-01 — End: 2023-06-01

## 2023-06-01 MED ORDER — fentaNYL (SUBLIMAZE) injection
50 | INTRAMUSCULAR | PRN
Start: 2023-06-01 — End: 2023-06-01
  Administered 2023-06-01 (×4): via INTRAVENOUS

## 2023-06-01 MED FILL — METHOCARBAMOL 100 MG/ML INJECTION SOLUTION: 100 100 mg/mL | INTRAMUSCULAR | Qty: 10 | Fill #0

## 2023-06-01 MED FILL — TRANEXAMIC ACID 1,000 MG/100 ML(10 MG/ML)IN SOD CHLOR,ISO IV PIGGYBACK: 1000 1,000 mg/100 mL (10 mg/mL) | INTRAVENOUS | Qty: 200 | Fill #0

## 2023-06-01 MED FILL — LIDOCAINE (PF) 20 MG/ML (2 %) INJECTION SOLUTION: 20 20 mg/mL (2 %) | INTRAMUSCULAR | Qty: 5 | Fill #0

## 2023-06-01 MED FILL — LACTATED RINGERS INTRAVENOUS SOLUTION: 20.0000 20.0000 mL/hr | INTRAVENOUS | Qty: 1000 | Fill #0

## 2023-06-01 MED FILL — ONDANSETRON HCL (PF) 4 MG/2 ML INJECTION SOLUTION: 4 4 mg/2 mL | INTRAMUSCULAR | Qty: 2 | Fill #0

## 2023-06-01 MED FILL — FENTANYL (PF) 50 MCG/ML INJECTION SOLUTION: 50 50 mcg/mL | INTRAMUSCULAR | Qty: 2 | Fill #0

## 2023-06-01 MED FILL — SODIUM CHLORIDE 0.9 % INTRAVENOUS SOLUTION: INTRAVENOUS | Qty: 1000 | Fill #0

## 2023-06-01 MED FILL — DEXAMETHASONE SODIUM PHOSPHATE 4 MG/ML INJECTION SOLUTION: 4 4 mg/mL | INTRAMUSCULAR | Qty: 5 | Fill #0

## 2023-06-01 MED FILL — OXYCODONE 5 MG TABLET: 5 5 MG | ORAL | Qty: 1 | Fill #0

## 2023-06-01 MED FILL — ROCURONIUM 10 MG/ML INTRAVENOUS SOLUTION: 10 10 mg/mL | INTRAVENOUS | Qty: 5 | Fill #0

## 2023-06-01 MED FILL — CEFAZOLIN 2 GRAM SOLUTION FOR INJECTION: 2 2 gram | INTRAMUSCULAR | Qty: 1 | Fill #0

## 2023-06-01 MED FILL — LIDOCAINE HCL 10 MG/ML (1 %) INJECTION SOLUTION: 10 10 mg/mL (1 %) | INTRAMUSCULAR | Qty: 20 | Fill #0

## 2023-06-01 MED FILL — GENTAMICIN 40 MG/ML INJECTION SOLUTION: 40 40 mg/mL | INTRAMUSCULAR | Qty: 4 | Fill #0

## 2023-06-01 MED FILL — KETOROLAC 30 MG/ML (1 ML) INJECTION SOLUTION: 30 30 mg/mL (1 mL) | INTRAMUSCULAR | Qty: 1 | Fill #0

## 2023-06-01 MED FILL — BRIDION 100 MG/ML INTRAVENOUS SOLUTION: 100 100 mg/mL | INTRAVENOUS | Qty: 2 | Fill #0

## 2023-06-01 MED FILL — CEFAZOLIN 1 GRAM SOLUTION FOR INJECTION: 1 1 g | INTRAMUSCULAR | Qty: 2 | Fill #0

## 2023-06-01 MED FILL — PROPOFOL 10 MG/ML INTRAVENOUS EMULSION: 10 10 mg/mL | INTRAVENOUS | Qty: 100 | Fill #0

## 2023-06-01 MED FILL — TYLENOL 325 MG TABLET: 325 325 mg | ORAL | Qty: 3 | Fill #0

## 2023-06-01 NOTE — TOC Discharge Planning (AHS/AVS) (Signed)
 Anesthesia Transfer of Care Note    Patient: Sarah Huang  Procedure(s) Performed: Procedure(s):  Bilateral breast silicone implant exchange for saline implant; Right breast capsulectomy; Bilateral axillary dog ear excision-COSMETIC - Bilateral axillary liposuction    Patient location: PACU    Anesthesia type: general endotracheal    Airway Device on Arrival to PACU/ICU: Nasal Cannula    IV Access: Peripheral    Monitors Recommended to be Used During PACU/ICU: Standard Monitors    Outstanding Issues to Address: None    Level of Consciousness: awake and alert     Post vital signs:    Vitals:    06/01/23 1036   BP: 150/72   Pulse: 81   Resp: 13   Temp: 97 F (36.1 C)   SpO2: 98%       Complications:  There were no known notable events for this encounter.    Date 05/31/23 0700 - 06/01/23 0659(Not Admitted) 06/01/23 0700 - 06/02/23 0659   Shift 0700-1459 1500-2259 2300-0659 24 Hour Total 0700-1459 1500-2259 2300-0659 24 Hour Total   INTAKE   I.V.     1000(13.1)   1000(13.1)     Volume (mL) (lactated Ringers  IV infusion)     1000   1000   Shift Total(mL/kg)     1000(13.1)   1000(13.1)   OUTPUT   Shift Total(mL/kg)           Weight (kg)   76.2 76.2 76.2 76.2 76.2 76.2

## 2023-06-01 NOTE — Progress Notes (Signed)
 Pt doing well, VSS, pain tolerable, d/c instructions reviewed with pt and her daughter. Both verbalized understanding. PCA wheeled pt to exit.

## 2023-06-01 NOTE — Interval H&P Note (Signed)
 H&P reviewed, patient examined, no changes to H&P.

## 2023-06-01 NOTE — Brief Op Note (Signed)
 Bilateral breast silicone implant exchange for saline implant; Right breast capsulectomy; Bilateral axillary dog ear excision-COSMETIC - Bilateral axillary liposuction  Brief Op Note  Sarah Huang  06/01/2023      Pre-op Diagnosis: Breast asymmetry following reconstructive surgery [N65.1]  Capsular contracture of breast implant, initial encounter [T85.44XA]       Post-op Diagnosis: same    Procedure(s):  Bilateral breast silicone implant exchange for saline implant; Right breast capsulectomy; Bilateral axillary dog ear excision-COSMETIC - Bilateral axillary liposuction      Surgeon(s):  Patricio Boop, MD    Anesthesia: General    Staff:   Circulator: Laird Pih, RN  Scrub Person: Avanell Bob Merdic  Assistant: Abelino Hof, RN  Resident: Annabel Kettle, MD    Estimated Blood Loss: less than 50 mL                 Specimens:   Specimens       ID Description Commments Type Source Tests Collected By Collected At    A left breast tissue  Tissue Breast Left SURGICAL PATHOLOGY EXAM   Patricio Boop, MD 06/01/23 0932    B right breast tissue and capsule  Tissue Tissue SURGICAL PATHOLOGY EXAM   Patricio Boop, MD 06/01/23 0932                   Drains:   Drain 1 Breast Right (Active)   Number of days: 3634       Drain 2 Breast Right (Active)   Number of days: 3634       Drain 1 Breast Left (Active)   Number of days: 3634       Drain 2 Breast Left (Active)   Number of days: 3634          Lipoaspirate from bilateral axilla: 650cc  Left breast:  - Left breast dog ear excision measuring 15 x 7 cm, weight 40 grams  - Explanted implant: Allergan 700cc silicone style 20, lot [id number]  - Placed implant: Allergan Natrelle saline implant style 68 HP, Ref 68HP-700. SN [id number].  - Implant filled to 750cc    Right breast:  - Right breast dog ear excision measuring 15cm x 7cm, weight 59 grams  - Capsulectomy weight: 50 grams  - Explanted implant: Allergan 700cc silicone style 20, lot [id number]  - Placed implant:  Allergan Natrelle saline implant style 68HP, Ref 68HP-700. SN [id number].  - Implant filled to 750cc            There were no complications unless listed below.         Annabel Kettle, MD     Date: 06/01/2023  Time: 9:59 AM

## 2023-06-01 NOTE — Discharge Instructions (Signed)
 PLASTIC SURGERY DISCHARGE INSTRUCTIONS    General Information:  You will have a clear glue or steri-strips covered with gauze and adhesive dressing/tape on your incision. A small amount of blood may be present - this is normal.  If you have a dressing:  Remove it two days after surgery, leave steri-strips in place  Showering instructions:  Two days after surgery (do not scrub at incisions or remove steri-strips/glue, okay to allow water  to run over them and pat dry)  No submersing in bath, hot tub, swimming pool, or lake for 6 weeks.    Medications:  Take your pain medication (usually Vicodin, Norco , Oxycodone , or Percocet) as directed. Take with food to minimize nausea and vomiting.   You may also take the additional medications if approved by your surgeon:  Acetaminophen  650 mg every 6 hours as needed for pain (do not take WITH another pain medication that contains acetaminophen ; only 4 grams of acetaminophen  can be taken in a 24 hour period).  Ibuprofen  800 mg three times a day with food.  If you have been prescribed antibiotics, it is important to take them until they are finished.  Take over the counter stool softener (colace, miralax, or senna) for narcotic related constipation.    Activities:  You may resume light activities and a regular diet. Do not lift anything heavier than a half-gallon of milk for at least 2 weeks.  Do not drive   While taking pain medicine.  Return to work:  To be determined after follow up with your surgeon    CALL THE CLINIC [phone]) OR YOUR SURGEON [phone]) IF YOU EXPERIENCE ANY OF THE FOLLOWING:  If your incision(s) becomes increasingly red, sore/painful, swollen, or warm to the touch.  If you develop a fever greater than 101.5.  If you experience chills, nausea, and/or vomiting.  If your incision starts to drain large amounts of purulent fluid or blood.    Future Appointments   Date Time Provider Department Center   06/07/2023 11:30 AM [facility] PLASTIC SURGERY CLINIC  PROVIDER [facility]   06/15/2023 10:00 AM Jacqueline Pfeil, PT [facility]   07/17/2023 10:30 AM Patricio Boop, MD [facility]

## 2023-06-01 NOTE — Anesthesia Post-Procedure Evaluation (Signed)
 Anesthesia Post Note    Patient: Sarah Huang    Procedure(s) Performed: Procedure(s):  Bilateral breast silicone implant exchange for saline implant; Right breast capsulectomy; Bilateral axillary dog ear excision-COSMETIC - Bilateral axillary liposuction    Anesthesia type: general endotracheal    Patient location: Same Day Surgery    Airway: Patent    Post pain: Adequate analgesia    Nausea / Vomiting: Absent    Post-operative Hydration Status: Adequate    Post assessment: no apparent anesthetic complications    Last Vitals:   Vitals:    06/01/23 1115 06/01/23 1130 06/01/23 1136 06/01/23 1200   BP: 149/65 141/63  146/67   BP Location:       Patient Position:       BP Cuff Size:       Pulse: 66 66 79 62   Resp: 10 12 16 12    Temp:   97.3 F (36.3 C)    TempSrc:       SpO2: 100% 100% 100% 93%   Weight:       Height:            Last Temperature: 97.3 F (36.3 C) (06/01/2023 11:36 AM)      Post vital signs: stable    Level of consciousness: awake    Complications:  There were no known notable events for this encounter.

## 2023-06-01 NOTE — Op Note (Signed)
 [facility]  OPERATIVE REPORT     PATIENT: Sarah Huang  MRN: [id number]  CSN: [id number]  DATE OF OPERATION: 06/01/2023  ATTENDING SURGEON: Peggye Bowers, MD  ASSISTANTS:   Jullie Oiler, PGY6    PREOPERATIVE DIAGNOSIS:   Breast asymmetry following reconstructive surgery  Capsular contracture of breast implant, right     POSTOPERATIVE DIAGNOSIS:   Same     OPERATIONS/PROCEDURES PERFORMED:   Bilateral breast silicone implant exchange for saline implants  Left breast explant: Allergan 700cc silicone style 20, lot [id number], unruptured  Left breast implant placed: Allergan Natrelle saline implant style 68 HP, Ref 68HP-700. SN [id number].   Right breast explant: Allergan 700cc silicone style 20, lot [id number], unruptured  Right breast implant placed: Allergan Natrelle saline implant style 68HP, Ref 68HP-700. SN [id number].   Bilateral implants filled to 750cc  Right breast capsulectomy, total   Weight 50 grams  Bilateral axillary dog ear excision (COSMETIC)  Right: 15 x 7 cm; weight 59 grams  Left: 15 x 7 cm; weight 40 grams  Bilateral axillary liposuction (COSMETIC)  Lipoaspirate: 650cc        SPECIMEN(S):   Left breast tissue  Right breast tissue and capsule     ESTIMATED BLOOD LOSS: 50cc     ANESTHESIA: General     COMPLICATIONS: None     INDICATIONS FOR OPERATION:   The patient is a 68 year-old female who presented to our clinic after implant based reconstruction with persistent asymmetry likely 2/2 to right sided radiation, and painful grade four capsular contracture of the right. Informed consent was obtained and the risks, benefits, and details of the procedure were explained to the patient who elected to proceed. Risks, benefits and alternatives of surgery were discussed in detail. The surgical procedure was discussed in detail. All of the patients questions were answered, and the patient demonstrates a good understanding of the proposed surgical plan.      DETAILS OF PROCEDURE:   After  informed consent was obtained, the patient was taken to the operating room and placed in the supine position. All pressure points were well padded. SCDs were placed on the lower extremities. General anesthesia was induced and the patient intubated without complication. A routine timeout was performed which confirmed the patient, procedure, risk of fire, allergies and confirmed administration of preoperative antibiotics.     We began the procedure by reinforcing our marks. We then made stab incisions in each axilla and infiltrated tumescent solution into the bilateral axillary fat for targeted liposuction. Once the tumescent had time to setup, liposuction was performed utilizing the SAFE method. Total lipoaspirate was 650cc. We then tailor tacked the area of dog ear excision and check for symmetry between sides. Once we were happy with symmetry, the skin was marked and staples were removed. Bilateral dog ear excision measured 15cm x7cm.  Using a 10-blade scalpel the marked area was incised. Using bovie electreocautery, dermatolipectomy was performed. The tissue was sent to pathology.    We then turned our attention to her breasts. On the left side we were able to enter the breast capsule laterally. We removed an intact left breast implant, breast implant information located above. This was noted to be intact, without rupture. The capsule on the left was smooth and soft with no evidence of capsular contracture. On the right we maintained adequate soft tissue to support the implant from lateral migration. Using bovie electocautery we elevated her breast soft tissue off of the capsule from  lateral to medial, and then inferior to superior. Once we had the anterior capsule dissected from the anterior skin and soft tissue we entered the breast capsule centrally. We removed an intact right breast implant, breast implant information located above. This was noted to be intact without rupture. Given her grade four capsular  contracture we then excised the capsule. This capsule was sent to pathology. On the right side we lowered her inframammary fold to be symmetric with the left breast.    The breast pocket was then irrigated with saline. Hemostasis was achieved on both sides using bovie electocautery, followed by TXA soaked laps. We then irrigated the breast pockets with betadine, followed by ancef -gentamicin  irrigation. We then placed floseal in each wound. Hemostasis was confirmed. The skin was wiped down with antibiotic irrigation, and all team members then changed their outer gloves. We then prepared the saline implants on the back table, by removing all air. We then expanded the saline implants to 200cc and inserted them into the breast bilaterally. We continued to fill the implants to 750 cc bilaterally. We felt that this provided a good contour, size match, and symmetry between the sides.    We then closed the left and right breast with a combination of 2-0 monocryl for deep parenchymal support, followed by 3-0 monocryl deep dermals, and lastly a 4-0 subcuticular monocryl. No drains were placed. The incision was dressed with dermabond prineo, and a surgical bra was placed with abdominal pads.    There were no immediate complications. At the end of the case, all needle, instrument and sponge counts were correct. Dr. Darryll Eng, the attending surgeon, was present throughout the entirety of the case and directed all clinical and operative decision making. The patient was extubated in the operating room without issue and transferred to PACU in stable condition.          Condition: Stable to PACU     Postop Plan:   - Pacu and then dispo home  - Continue soft surgical bra  - Avoid underwire x 8 weeks minimum  - Follow up with Nps next week, and RMG in six weeks    Jullie Oiler, MD  Plastic Surgery  Chief Resident  PGY-6  Pager: [phone]  06/01/2023    I agree with the above operative note. I was present throughout the entirety of  the case and directed all clinical and operative decision making.

## 2023-06-01 NOTE — Anesthesia Pre-Procedure Evaluation (Signed)
 [facility]  DEPARTMENT OF ANESTHESIOLOGY  PRE-PROCEDURAL EVALUATION    Sarah Huang is a 68 y.o. year old female presenting for:    Procedure(s):  Bilateral breast silicone implant exchange for saline implant; Right breast capsulectomy; Bilateral axillary dog ear excision-COSMETIC - Bilateral axillary liposuction    Surgeon:   Patricio Boop, MD    Chief Complaint     Breast asymmetry following reconstructive surgery [N65.1]; Capsular contra*    Review of Systems     Anesthesia Evaluation    Patient summary reviewed and Previous anesthetic and pre-operative note copied, reviewed and updated as necessary.       No history of anesthetic complications   I have reviewed the History and Physical Exam, any relevant changes are noted in the anesthesia pre-operative evaluation.      Cardiovascular:    Exercise tolerance: good    (+) hyperlipidemia.          (-) hypertension, CAD, CABG/stent, dysrhythmias, angina, orthopnea.  ROS comment: Holter 01/2023 completed for palpitations s/p XRT without significant findings.      Stress ECHO 2011  LV size, wall thickness and systolic function were normal.  LVEF 55-65%.  Normal wall motion, no regional wall motion abnormalities.     Stress EKG:  No stress arrhythmias or conduction abnormalities,  The stress EKG was negative for ischemia  Impression: normal study after maximal exercise.         Neuro/Muscoloskeletal/Psych:    (+) neuromuscular disease (hx of fibromyalgia), headaches (imitrex ) and depression.      (-) seizures, CVA.   Comments: Hearing aids    Pulmonary:          Sleep apnea.    (-) no pneumonia, COPD, shortness of breath, recent URI.       GI/Hepatic/Renal:    (+) liver disease (fatty liver disease).  GERD is well controlled.      (-) renal disease.    Endo/Other:    (+) cancer (breast cancer s/p mastectomy in 2015; recurrence 2023) and radiation treatment.      (-) diabetes mellitus, hypothyroidism, hyperthyroidism, no anemia, no immunosuppression, no steroid  use.      Past Medical History     Past Medical History:   Diagnosis Date    Cancer (CMS-HCC) 06/10/2013    (R) breast    Constipation 06/17/2013    IIBS    Depression     Dermatitis     Dyslipidemia     Fatty liver     GERD (gastroesophageal reflux disease)     Hyperlipidemia     Kidney stones     Psoriasis     Sleep apnea 06/17/2013    not using C-PAP    Urinary tract infection        Past Surgical History     Past Surgical History:   Procedure Laterality Date    BIOPSY NODE SENTINEL Right 08/02/2021    Procedure: RIGHT AXILLARY SENTINEL LYMPH NODE BIOPSY,;  Surgeon: Rosendo Conroy, MD;  Location: [facility] OR;  Service: General;  Laterality: Right;  RIGHT AXILLARY SENTINEL LYMPH NODE BIOPSY,    BREAST BIOPSY Right 08/02/2021    Procedure: RIGHT BREAST EXCISION OF RECURRENCE, EXCISION OF  RIGHT RIGHT BREAST SKIN LESION, SMARTCLIP LOCALIZED EXCISION OF CLIPPED AXILLARY LYMPH NODE;  Surgeon: Rosendo Conroy, MD;  Location: [facility] OR;  Service: General;  Laterality: Right;  RIGHT BREAST EXCISION OF RECURRENCE, EXCISION OF  RIGHT RIGHT BREAST SKIN LESION, SMARTCLIP LOCALIZED EXCISION OF CLIPPED  AXILLARY LYMPH NODE    BREAST SURGERY  06/19/13    immediate bilateral TE    COLONOSCOPY N/A 05/29/2012    Procedure: COLONOSCOPY W/ OR W/O BIOPSY;  Surgeon: Jacklyn Mash, MD;  Location: [facility] ENDOSCOPY;  Service: Gastroenterology;  Laterality: N/A;  limited colonoscopy.    COLONOSCOPY N/A 06/27/2012    Procedure: COLONOSCOPY W/ OR W/O BIOPSY;  Surgeon: Jacklyn Mash, MD;  Location: [facility] END[facility];  Service: Gastroenterology;  Laterality: N/A;  colonoscopy    DISSECTION AXILLARY Right 06/19/2013    Procedure: -POSS RIGHT AXILLARY NODE DISSECTION;  Surgeon: Glory Larsen, MD;  Location: [facility] OR;  Service: General;  Laterality: Right;    ESOPHAGOGASTRODUODENOSCOPY N/A 05/29/2012    Procedure: EGD;  Surgeon: Jacklyn Mash, MD;  Location: [facility];  Service: Gastroenterology;  Laterality: N/A;    HYSTERECTOMY  0220    Hysty/BSO  2004-etopic pregnancy/prolapse    mid urethral sling   2002    at time of [facility]    REMOVE BREAST TISSUE EXPANDERS INSERTION IMPLANTS Bilateral 06/19/2013    Procedure: Dr. Denman Fischer at 1:30pm-placement of bilateral tissue expanders;  Surgeon: Pecolia Bourbon, MD;  Location: [facility] OR;  Service: Plastics;  Laterality: Bilateral;    REMOVE BREAST TISSUE EXPANDERS INSERTION IMPLANTS Bilateral 11/26/2013    Procedure: EXCHANGE BILATERAL TISSUE EXPANDER FOR IMPLANT;  Surgeon: Alston Jerry, MD;  Location: [facility];  Service: Plastics;  Laterality: Bilateral;    SALPINGOOPHORECTOMY  2004    Hysty/BSO-etopic pregnancy/prolapse    SINUS SURGERY      hx sinus surg x3    TONSILLECTOMY      in the past       Family History     Family History   Problem Relation Age of Onset    Diabetes Mother     Hypertension Mother     Coronary artery disease Mother     Heart disease Mother         Triple Bypass    COPD Mother     Hyperlipidemia Mother     Coronary artery disease Father     Hyperlipidemia Father     COPD Father     Heart failure Father         CHF    Emphysema Father     Other Sister         Blood Disorder    COPD Sister     Psoriasis Sister     Heart attack Brother     COPD Brother     Heart disease Brother         1 brother -Triple Bypass    Other Brother         Heart Defect, Open heart surgery    Psoriasis Brother     Breast Cancer Other     COPD Other     Breast Cancer Other     Breast Cancer Paternal Aunt     Eczema Neg Hx     Melanoma Neg Hx        Social History     Social History     Socioeconomic History    Marital status: Divorced     Spouse name: Not on file    Number of children: 2    Years of education: 16    Highest education level: Bachelor's degree (e.g., BA, AB, BS)   Occupational History    Occupation: Charity fundraiser in [place]    Tobacco Use    Smoking status: Former  Current packs/day: 0.00     Types: Cigarettes     Quit date: 11/11/1989     Years since quitting: 33.5     Smokeless tobacco: Never    Tobacco comments:     quit 1991   Vaping Use    Vaping status: Never Used   Substance and Sexual Activity    Alcohol use: Yes     Comment: socially    Drug use: No    Sexual activity: Not Currently     Birth control/protection: Diaphragm     Comment: divorced   Other Topics Concern    Caffeine Use Yes     Comment: green tea    Occupational Exposure No    Exercise No    Seat Belt Yes     Comment: 100%   Social History Narrative    Not on file     Social Drivers of Health     Financial Resource Strain: Low Risk  (08/18/2021)    Overall Financial Resource Strain (CARDIA)     Difficulty of Paying Living Expenses: Not hard at all   Food Insecurity: No Food Insecurity (01/20/2023)    Yearly Questionnaire     Do you need any assistance with obtaining housing, meals, medication, transportation or medical equipment?: No     Assistance needed for:: Not on file   Transportation Needs: No Transportation Needs (01/20/2023)    Yearly Questionnaire     Do you need any assistance with obtaining housing, meals, medication, transportation or medical equipment?: No     Assistance needed for:: Not on file   Physical Activity: Inactive (08/18/2021)    Exercise Vital Sign     Days of Exercise per Week: 0 days     Minutes of Exercise per Session: 0 min   Stress: No Stress Concern Present (08/18/2021)    [employer] of Occupational Health - Occupational Stress Questionnaire     Feeling of Stress : Not at all   Social Connections: Moderately Isolated (08/18/2021)    Social Connection and Isolation Panel [NHANES]     Frequency of Communication with Friends and Family: More than three times a week     Frequency of Social Gatherings with Friends and Family: Not on file     Attends Religious Services: More than 4 times per year     Active Member of [employer] or Organizations: No     Attends Banker Meetings: Not on file     Marital Status: Divorced   Intimate Partner Violence: Not At Risk  (06/01/2023)    Humiliation, Afraid, Rape, and Kick questionnaire     Fear of Current or Ex-Partner: No     Emotionally Abused: No     Physically Abused: No     Sexually Abused: No   Housing Stability: Low Risk  (01/20/2023)    Yearly Questionnaire     Do you need any assistance with obtaining housing, meals, medication, transportation or medical equipment?: No     Assistance needed for:: Not on file       Medications     Allergies:  Allergies   Allergen Reactions    Statins-Hmg-Coa Reductase Inhibitors      Other reaction(s): Other reaction  severe leg cramps    Crestor  [Rosuvastatin ] Other (See Comments)     Causes tightening in leg muscles.       Home Meds:  Prior to Admission medications as of 07/27/21 0911   Medication Sig Taking?   ascorbic  acid, vitamin C, (VITAMIN C) 500 MG tablet 1 tablet (500 mg total) daily. Yes   cetirizine  (ZYRTEC ) 10 MG tablet Take 1 tablet (10 mg total) by mouth daily. Yes   cyanocobalamin , vitamin B-12, 1,000 mcg Subl Dissolve one tablet under tongue daily Yes   ergocalciferol  (ERGOCALCIFEROL ) 1,250 mcg (50,000 unit) capsule TAKE 1 CAPSULE BY MOUTH 1 TIME A WEEK Yes   ibuprofen  (MOTRIN ) 800 MG tablet Take 1 tablet (800 mg total) by mouth every 8 hours as needed. Yes   omeprazole  (PRILOSEC) 40 MG capsule TAKE 1 CAPSULE(40 MG) BY MOUTH EVERY MORNING BEFORE BREAKFAST Yes   TURMERIC ORAL Take by mouth. Yes   SUMAtriptan  (IMITREX ) 100 MG tablet Take one tablet po daily as needed for migraine, may repeat in 2 hours if needed in 24 hours.        Inpatient Meds:  Scheduled:   Continuous:     PRN:     Vital Signs     Wt Readings from Last 3 Encounters:   06/01/23 168 lb (76.2 kg)   05/29/23 168 lb 11.2 oz (76.5 kg)   01/20/23 161 lb 12.8 oz (73.4 kg)     Ht Readings from Last 3 Encounters:   06/01/23 5' 3 (1.6 m)   05/29/23 5' 3 (1.6 m)   01/20/23 5' 3 (1.6 m)     Temp Readings from Last 3 Encounters:   06/01/23 97.4 F (36.3 C) (Oral)   05/29/23 97.1 F (36.2 C) (Oral)   08/19/21  96.7 F (35.9 C) (Temporal)     BP Readings from Last 3 Encounters:   06/01/23 153/70   05/29/23 151/66   01/20/23 150/90     Pulse Readings from Last 3 Encounters:   06/01/23 64   05/29/23 71   01/20/23 105     @LASTSAO2 (3)@    Physical Exam     Airway:     Mallampati: II  Mouth Opening: >2 FB  TM distance: > = 3 FB  Neck ROM: full    Dental:   - No obvious cracked, loose, chipped, or missing teeth.   (+) implants    Pulmonary:   - normal exam      Breath sounds clear to auscultation.       Cardiovascular:  - normal exam   Rhythm: regular  Rate: normal    Neuro/Musculoskeletal/Psych:  - normal neurological exam.   Mental status: alert and oriented to person, place and time.          Abdominal:    - normal exam    Current OB Status:       Other Findings:        Laboratory Data     Lab Results   Component Value Date    WBC 6.0 05/29/2023    HGB 12.9 05/29/2023    HCT 38.0 05/29/2023    MCV 94.2 05/29/2023    PLT 230 05/29/2023       No results found for: [facility]    Lab Results   Component Value Date    GLUCOSE 122 (H) 05/29/2023    BUN 12 05/29/2023    CO2 27 05/29/2023    CREATININE 0.66 05/29/2023    K 4.0 05/29/2023    NA 142 05/29/2023    CL 110 05/29/2023    CALCIUM 9.4 05/29/2023    ALBUMIN  4.4 05/29/2023    PROT 7.3 05/29/2023    ALKPHOS 50 05/29/2023    ALT 12 05/29/2023  AST 18 05/29/2023    BILITOT 0.3 05/29/2023       No results found for: PTT, INR    No results found for: PREGTESTUR, PREGSERUM, HCG, HCGQUANT    Anesthesia Plan     ASA 2         Female and current non-smoker    Anesthesia Type:  general endotracheal.      PONV Risk Factors: female, current non-smoker,  plan for postoperative opioid use.              Induction:   Intravenous induction.      Anesthetic plan and risks discussed with patient and sibling.    Plan, alternatives, and risks of anesthesia, including death, have been explained to and discussed with the patient/legal guardian.  By my assessment, the patient/legal  guardian understands and agrees.  Scenario presented in detail.  Questions answered.    Use of blood products discussed with patient and sibling who consented to blood products.        Plan discussed with attending and resident.

## 2023-06-02 NOTE — Telephone Encounter (Signed)
 I called post op patient to follow up from sx, per provider's request. Russ Course is doing well. She will call us  if anything changes

## 2023-06-07 ENCOUNTER — Ambulatory Visit: Admit: 2023-06-07 | Discharge: 2023-06-11 | Payer: PRIVATE HEALTH INSURANCE

## 2023-06-07 DIAGNOSIS — Z9889 Other specified postprocedural states: Secondary | ICD-10-CM

## 2023-06-07 NOTE — Progress Notes (Signed)
 PLASTIC SURGERY  POSTOP VISIT    Procedure:  Bilateral breast silicone implant exchange for saline implants  Left breast explant: Allergan 700cc silicone style 20, lot [id number], unruptured  Left breast implant placed: Allergan Natrelle saline implant style 68 HP, Ref 68HP-700. SN [id number].   Right breast explant: Allergan 700cc silicone style 20, lot [id number], unruptured  Right breast implant placed: Allergan Natrelle saline implant style 68HP, Ref 68HP-700. SN [id number].   Bilateral implants filled to 750cc  Right breast capsulectomy, total   Weight 50 grams  Bilateral axillary dog ear excision (COSMETIC)  Right: 15 x 7 cm; weight 59 grams  Left: 15 x 7 cm; weight 40 grams  Bilateral axillary liposuction (COSMETIC)  Lipoaspirate: 650cc     Date: 06/01/23    Complaints: Pt states overall she is doing well.  She has been following all post op instructions.  She has been wearing her post op bra and following lifting restrictions.  She states she is sore.      Findings:  BP 130/80   Pulse 83   Ht 5' 3 (1.6 m)   Wt 169 lb (76.7 kg)   SpO2 96%   BMI 29.94 kg/m   Physical Exam  Constitutional:       Appearance: Normal appearance.   Chest:      Comments: Bilateral breasts soft and symmetrical.  .  Ecchymosis noted bilaterally.  No erythema or drainage. Good shape and size  Skin:     General: Skin is warm.   Neurological:      General: No focal deficit present.      Mental Status: She is alert and oriented to person, place, and time.   Psychiatric:         Mood and Affect: Mood normal.         Behavior: Behavior normal.          Assessment:  S/p   Bilateral breast silicone implant exchange for saline implants  Left breast explant: Allergan 700cc silicone style 20, lot [id number], unruptured  Left breast implant placed: Allergan Natrelle saline implant style 68 HP, Ref 68HP-700. SN [id number].   Right breast explant: Allergan 700cc silicone style 20, lot [id number], unruptured  Right breast implant placed: Allergan Natrelle  saline implant style 68HP, Ref 68HP-700. SN [id number].   Bilateral implants filled to 750cc  Right breast capsulectomy, total   Weight 50 grams  Bilateral axillary dog ear excision (COSMETIC)  Right: 15 x 7 cm; weight 59 grams  Left: 15 x 7 cm; weight 40 grams  Bilateral axillary liposuction (COSMETIC)  Lipoaspirate: 650cc       Plan:  - dermabond will flake off on its own  - if prineo is still present in 2 weeks, may use soap to remove, if unable to remove, please call the office and an MA can help remove with adhesive remover  - if any area is open once adhesives are gone, baci and drsg to open area until closed, avoid dirty/dusty/standing water  until completely closed up, contact us  with any questions and may send picture  - once adhesives are gone and incision are closed, M&M to healed incision  - continue snug-fitting soft cotton front-closure bra, does not have to be postop bra  - continue lifting restrictions for 6 weeks  - Ok to start physical therapy post op week 1  - driving restrictions can be lifted once no longer taking narcotics, and good AROM of shoulders/BUE/trunk  - ROM exercises TID at home  -  OK to shower, plain antibacterial soap and water   - alternate ibu 800mg  TID (with food) and Tylenol  1000mg  TID PRN  - RTC at 6 weeks postop with surgeon as previously scheduled        Future Appointments   Date Time Provider Department Center   06/07/2023 11:30 AM [facility] P[facility]IC SURG[facility]IN[facility] PROVIDER [facility]   06/15/2023 10:00 AM Jacqueline Pfeil, PT [facility]   07/17/2023 10:30 AM Verdie Gladden Deannie Fabian, MD [facility]          [facility] Jenna Moan, NP   Plastic Surgery  [phone]

## 2023-07-17 ENCOUNTER — Ambulatory Visit: Admit: 2023-07-17 | Discharge: 2023-07-22 | Payer: PRIVATE HEALTH INSURANCE

## 2023-07-17 DIAGNOSIS — Z9889 Other specified postprocedural states: Principal | ICD-10-CM

## 2023-07-17 NOTE — Progress Notes (Signed)
 [facility]  Division of Plastic, Reconstructive, and Hand Surgery  Plastic Surgery Clinic Note     Plastic Surgery Post-Operative Follow-up    07/17/2023  CHIEF COMPLAINT  Chief Complaint   Patient presents with    Post-op Evaluation     6 wk post-op; Bilateral breast silicone implant exchange for saline implant; Right breast capsulectomy; Bilateral axillary dog ear excision 06/01/23       PROCEDURE:     Procedure: Bilateral breast silicone implant exchange for saline implants  Left breast explant: Allergan 700cc silicone style 20, lot [id number], unruptured  Left breast implant placed: Allergan Natrelle saline implant style 68 HP, Ref 68HP-700. SN [id number].   Right breast explant: Allergan 700cc silicone style 20, lot [id number], unruptured  Right breast implant placed: Allergan Natrelle saline implant style 68HP, Ref 68HP-700. SN [id number].   Bilateral implants filled to 750cc  Right breast capsulectomy, total   Weight 50 grams  Bilateral axillary dog ear excision (COSMETIC)  Right: 15 x 7 cm; weight 59 grams  Left: 15 x 7 cm; weight 40 grams  Bilateral axillary liposuction (COSMETIC)  Lipoaspirate: 650cc  DATE: 06/01/2023      SUBJECTIVE:  Patient reports some intermittent burning pain on her right breast and axilla associated with progressively limited range of motion in her right shoulder. She has been doing some at home physiotherapy exercises recommended by colleagues but not formally seen by a physiotherapist. She also notes a bubble forming at the end of her incision on the left side    OBJECTIVE:     PHYSICAL EXAM  Blood pressure (!) 144/98, pulse 98, height 5' 3 (1.6 m), weight 172 lb 6.4 oz (78.2 kg), SpO2 99%.    General: Alert. In NAD. Cooperative.  Neuro: Alert and oriented x 3.  HEENT: NCAT, PERRL, sclerae anicteric, nares patent, oropharynx clear, mucous membranes moist, neck supple, no tracheal deviation  Respiratory: Non-labored, non-distressed  CV: RRR.  Abdomen:  nontender, nondistended  Ext: warm and well perfused. Good cap refill <2sec in all extremities. Moving all extremities with normal ROM, sensation within normal limits.    Local Exam: Well healed incisions bl. Small Dog ears noted  BL of the incisions- R axilla to back, L breast in picture below            ASSESSMENT & PLAN  Sarah Huang is a 68 y.o. female with past medical history significant for breast cancer with definitive silicone implant reconstruction in 2015 and unfortunately developed recurrence in 2023 treated with re-excision and RT (completed in December 2023), which then became associated w/ R implant Grade 4 capsular contracture.     She is now s/p R Breast Capsulectomy, R BL Implant Exchange for Saline Implants.   (L Allergan Natrelle saline implant style 68 HP, Ref 68HP-700. SN [id number]. R Allergan Natrelle saline implant style 68HP, Ref 68HP-700. SN [id number].  Both filled to 750 cc), BL Dog Ear Revision, BL Axillary Liposuction (650cc) 6 weeks post op    Patient is doing well and is happy with her results.  Note this patient resides in [place], [place] but receives much of her health care in [place] as an [place].    Activity Restriction Lifted  Recommend Breast PT in order to address new pains and restrictions in her right breast and shoulder  She is okay to return to work and we will provide a note.   Patient advised to perform scar massage daily using aquaphor or similar product.  Also advised to use silicone surgical scar tape over all healed scars. Monitor for thickened or raised scars.  Continue to wear supportive surgical bra for 3 months. No underwire until 6 month mark  RTC: PRN      JEROLD SCHNEIDER, MD   Independent Plastic Surgery Resident, PGY-7  [phone]    I was present with the resident during the history and exam on 07/17/23. I discussed the case with the resident and agree with the findings and plan as documented in the residents note.
